# Patient Record
Sex: Female | Born: 1935 | Race: White | Hispanic: No | Smoking: Current every day smoker
Health system: Southern US, Community
[De-identification: ages and names within clinical notes are randomized; demographics above are authoritative.]

## PROBLEM LIST (undated history)

## (undated) DIAGNOSIS — F32A Depression, unspecified: Secondary | ICD-10-CM

## (undated) DIAGNOSIS — E785 Hyperlipidemia, unspecified: Secondary | ICD-10-CM

## (undated) DIAGNOSIS — I714 Abdominal aortic aneurysm, without rupture, unspecified: Secondary | ICD-10-CM

## (undated) DIAGNOSIS — T8859XA Other complications of anesthesia, initial encounter: Secondary | ICD-10-CM

## (undated) DIAGNOSIS — I272 Pulmonary hypertension, unspecified: Secondary | ICD-10-CM

## (undated) DIAGNOSIS — J189 Pneumonia, unspecified organism: Secondary | ICD-10-CM

## (undated) DIAGNOSIS — T4145XA Adverse effect of unspecified anesthetic, initial encounter: Secondary | ICD-10-CM

## (undated) DIAGNOSIS — I700 Atherosclerosis of aorta: Secondary | ICD-10-CM

## (undated) DIAGNOSIS — M1990 Unspecified osteoarthritis, unspecified site: Secondary | ICD-10-CM

## (undated) DIAGNOSIS — I10 Essential (primary) hypertension: Secondary | ICD-10-CM

## (undated) DIAGNOSIS — F1910 Other psychoactive substance abuse, uncomplicated: Secondary | ICD-10-CM

## (undated) DIAGNOSIS — Z8719 Personal history of other diseases of the digestive system: Secondary | ICD-10-CM

## (undated) DIAGNOSIS — K429 Umbilical hernia without obstruction or gangrene: Secondary | ICD-10-CM

## (undated) DIAGNOSIS — I5020 Unspecified systolic (congestive) heart failure: Secondary | ICD-10-CM

## (undated) DIAGNOSIS — I4891 Unspecified atrial fibrillation: Secondary | ICD-10-CM

## (undated) DIAGNOSIS — J40 Bronchitis, not specified as acute or chronic: Secondary | ICD-10-CM

## (undated) DIAGNOSIS — F329 Major depressive disorder, single episode, unspecified: Secondary | ICD-10-CM

## (undated) DIAGNOSIS — J449 Chronic obstructive pulmonary disease, unspecified: Secondary | ICD-10-CM

## (undated) DIAGNOSIS — F419 Anxiety disorder, unspecified: Secondary | ICD-10-CM

## (undated) DIAGNOSIS — E7800 Pure hypercholesterolemia, unspecified: Secondary | ICD-10-CM

## (undated) HISTORY — DX: Major depressive disorder, single episode, unspecified: F32.9

## (undated) HISTORY — DX: Abdominal aortic aneurysm, without rupture: I71.4

## (undated) HISTORY — DX: Atherosclerosis of aorta: I70.0

## (undated) HISTORY — DX: Hyperlipidemia, unspecified: E78.5

## (undated) HISTORY — PX: TONSILLECTOMY: SUR1361

## (undated) HISTORY — DX: Bronchitis, not specified as acute or chronic: J40

## (undated) HISTORY — PX: ABDOMINAL HYSTERECTOMY: SHX81

## (undated) HISTORY — DX: Pulmonary hypertension, unspecified: I27.20

## (undated) HISTORY — DX: Depression, unspecified: F32.A

## (undated) HISTORY — DX: Anxiety disorder, unspecified: F41.9

## (undated) HISTORY — DX: Umbilical hernia without obstruction or gangrene: K42.9

## (undated) HISTORY — DX: Abdominal aortic aneurysm, without rupture, unspecified: I71.40

## (undated) HISTORY — DX: Other psychoactive substance abuse, uncomplicated: F19.10

## (undated) HISTORY — DX: Unspecified atrial fibrillation: I48.91

## (undated) HISTORY — DX: Personal history of other diseases of the digestive system: Z87.19

## (undated) HISTORY — DX: Essential (primary) hypertension: I10

## (undated) HISTORY — DX: Chronic obstructive pulmonary disease, unspecified: J44.9

## (undated) HISTORY — DX: Pure hypercholesterolemia, unspecified: E78.00

---

## 2004-06-28 ENCOUNTER — Ambulatory Visit (HOSPITAL_COMMUNITY): Admission: RE | Admit: 2004-06-28 | Discharge: 2004-06-28 | Payer: Self-pay | Admitting: Internal Medicine

## 2004-07-07 ENCOUNTER — Ambulatory Visit (HOSPITAL_COMMUNITY): Admission: RE | Admit: 2004-07-07 | Discharge: 2004-07-07 | Payer: Self-pay | Admitting: Internal Medicine

## 2004-07-10 LAB — HM DEXA SCAN

## 2004-09-16 ENCOUNTER — Ambulatory Visit (HOSPITAL_COMMUNITY): Admission: RE | Admit: 2004-09-16 | Discharge: 2004-09-16 | Payer: Self-pay | Admitting: Internal Medicine

## 2005-04-14 ENCOUNTER — Ambulatory Visit (HOSPITAL_COMMUNITY): Admission: RE | Admit: 2005-04-14 | Discharge: 2005-04-14 | Payer: Self-pay | Admitting: Internal Medicine

## 2005-10-14 ENCOUNTER — Emergency Department (HOSPITAL_COMMUNITY): Admission: EM | Admit: 2005-10-14 | Discharge: 2005-10-14 | Payer: Self-pay | Admitting: Emergency Medicine

## 2006-09-14 ENCOUNTER — Ambulatory Visit (HOSPITAL_COMMUNITY): Admission: RE | Admit: 2006-09-14 | Discharge: 2006-09-14 | Payer: Self-pay | Admitting: Internal Medicine

## 2007-02-13 ENCOUNTER — Ambulatory Visit (HOSPITAL_COMMUNITY): Admission: RE | Admit: 2007-02-13 | Discharge: 2007-02-13 | Payer: Self-pay | Admitting: Internal Medicine

## 2007-11-25 ENCOUNTER — Ambulatory Visit: Payer: Self-pay | Admitting: Surgery

## 2007-11-25 ENCOUNTER — Encounter (INDEPENDENT_AMBULATORY_CARE_PROVIDER_SITE_OTHER): Payer: Self-pay | Admitting: Emergency Medicine

## 2007-11-25 ENCOUNTER — Emergency Department (HOSPITAL_COMMUNITY): Admission: EM | Admit: 2007-11-25 | Discharge: 2007-11-25 | Payer: Self-pay | Admitting: Emergency Medicine

## 2009-04-08 ENCOUNTER — Emergency Department (HOSPITAL_COMMUNITY): Admission: EM | Admit: 2009-04-08 | Discharge: 2009-04-09 | Payer: Self-pay | Admitting: Emergency Medicine

## 2010-05-10 HISTORY — PX: SMALL INTESTINE SURGERY: SHX150

## 2010-05-10 HISTORY — PX: HERNIA REPAIR: SHX51

## 2010-05-15 ENCOUNTER — Inpatient Hospital Stay (HOSPITAL_COMMUNITY)
Admission: EM | Admit: 2010-05-15 | Discharge: 2010-05-20 | Payer: Self-pay | Source: Home / Self Care | Admitting: Emergency Medicine

## 2010-05-16 ENCOUNTER — Encounter (INDEPENDENT_AMBULATORY_CARE_PROVIDER_SITE_OTHER): Payer: Self-pay | Admitting: Internal Medicine

## 2010-05-17 ENCOUNTER — Encounter (INDEPENDENT_AMBULATORY_CARE_PROVIDER_SITE_OTHER): Payer: Self-pay | Admitting: Internal Medicine

## 2010-09-20 LAB — URINE CULTURE
Colony Count: 40000
Culture  Setup Time: 201111062354

## 2010-09-20 LAB — COMPREHENSIVE METABOLIC PANEL
ALT: 19 U/L (ref 0–35)
ALT: 20 U/L (ref 0–35)
ALT: 23 U/L (ref 0–35)
AST: 27 U/L (ref 0–37)
AST: 29 U/L (ref 0–37)
AST: 30 U/L (ref 0–37)
Albumin: 3.5 g/dL (ref 3.5–5.2)
Albumin: 3.9 g/dL (ref 3.5–5.2)
Alkaline Phosphatase: 38 U/L — ABNORMAL LOW (ref 39–117)
Alkaline Phosphatase: 47 U/L (ref 39–117)
Alkaline Phosphatase: 53 U/L (ref 39–117)
BUN: 14 mg/dL (ref 6–23)
BUN: 20 mg/dL (ref 6–23)
CO2: 25 mEq/L (ref 19–32)
CO2: 27 mEq/L (ref 19–32)
CO2: 27 mEq/L (ref 19–32)
Calcium: 8.4 mg/dL (ref 8.4–10.5)
Calcium: 8.8 mg/dL (ref 8.4–10.5)
Calcium: 9.3 mg/dL (ref 8.4–10.5)
Chloride: 103 mEq/L (ref 96–112)
Chloride: 107 mEq/L (ref 96–112)
Creatinine, Ser: 1.08 mg/dL (ref 0.4–1.2)
Creatinine, Ser: 1.18 mg/dL (ref 0.4–1.2)
GFR calc Af Amer: 54 mL/min — ABNORMAL LOW (ref >60)
GFR calc Af Amer: 59 mL/min — ABNORMAL LOW (ref >60)
GFR calc Af Amer: >60 mL/min (ref >60)
GFR calc non Af Amer: 44 mL/min — ABNORMAL LOW (ref >60)
GFR calc non Af Amer: 49 mL/min — ABNORMAL LOW (ref >60)
GFR calc non Af Amer: >60 mL/min (ref >60)
Glucose, Bld: 114 mg/dL — ABNORMAL HIGH (ref 70–99)
Glucose, Bld: 130 mg/dL — ABNORMAL HIGH (ref 70–99)
Glucose, Bld: 142 mg/dL — ABNORMAL HIGH (ref 70–99)
Potassium: 3.9 mEq/L (ref 3.5–5.1)
Potassium: 4.1 mEq/L (ref 3.5–5.1)
Potassium: 4.6 mEq/L (ref 3.5–5.1)
Sodium: 138 mEq/L (ref 135–145)
Sodium: 140 mEq/L (ref 135–145)
Sodium: 141 mEq/L (ref 135–145)
Total Bilirubin: 0.7 mg/dL (ref 0.3–1.2)
Total Bilirubin: 0.7 mg/dL (ref 0.3–1.2)
Total Protein: 5.9 g/dL — ABNORMAL LOW (ref 6.0–8.3)
Total Protein: 6.2 g/dL (ref 6.0–8.3)
Total Protein: 6.9 g/dL (ref 6.0–8.3)

## 2010-09-20 LAB — URINALYSIS, ROUTINE W REFLEX MICROSCOPIC
Bilirubin Urine: NEGATIVE
Glucose, UA: NEGATIVE mg/dL
Hgb urine dipstick: NEGATIVE
Ketones, ur: NEGATIVE mg/dL
Nitrite: NEGATIVE
Protein, ur: NEGATIVE mg/dL
Specific Gravity, Urine: 1.022 (ref 1.005–1.030)
Urobilinogen, UA: 0.2 mg/dL (ref 0.0–1.0)
pH: 7 (ref 5.0–8.0)

## 2010-09-20 LAB — DIFFERENTIAL
Basophils Absolute: 0 10*3/uL (ref 0.0–0.1)
Basophils Absolute: 0.1 10*3/uL (ref 0.0–0.1)
Basophils Relative: 0 % (ref 0–1)
Basophils Relative: 1 % (ref 0–1)
Eosinophils Absolute: 0 10*3/uL (ref 0.0–0.7)
Eosinophils Absolute: 0 10*3/uL (ref 0.0–0.7)
Eosinophils Relative: 0 % (ref 0–5)
Eosinophils Relative: 0 % (ref 0–5)
Lymphocytes Relative: 13 % (ref 12–46)
Lymphocytes Relative: 15 % (ref 12–46)
Lymphs Abs: 1.5 10*3/uL (ref 0.7–4.0)
Lymphs Abs: 1.6 10*3/uL (ref 0.7–4.0)
Monocytes Absolute: 0.5 10*3/uL (ref 0.1–1.0)
Monocytes Absolute: 0.5 10*3/uL (ref 0.1–1.0)
Monocytes Relative: 4 % (ref 3–12)
Monocytes Relative: 5 % (ref 3–12)
Neutro Abs: 8.3 10*3/uL — ABNORMAL HIGH (ref 1.7–7.7)
Neutro Abs: 9.2 10*3/uL — ABNORMAL HIGH (ref 1.7–7.7)
Neutrophils Relative %: 80 % — ABNORMAL HIGH (ref 43–77)
Neutrophils Relative %: 82 % — ABNORMAL HIGH (ref 43–77)

## 2010-09-20 LAB — CBC
HCT: 37 % (ref 36.0–46.0)
HCT: 39.9 % (ref 36.0–46.0)
HCT: 41 % (ref 36.0–46.0)
HCT: 44.3 % (ref 36.0–46.0)
Hemoglobin: 12.8 g/dL (ref 12.0–15.0)
Hemoglobin: 14.1 g/dL (ref 12.0–15.0)
Hemoglobin: 15.2 g/dL — ABNORMAL HIGH (ref 12.0–15.0)
MCH: 33.8 pg (ref 26.0–34.0)
MCH: 34.1 pg — ABNORMAL HIGH (ref 26.0–34.0)
MCHC: 34.3 g/dL (ref 30.0–36.0)
MCHC: 34.4 g/dL (ref 30.0–36.0)
MCHC: 34.4 g/dL (ref 30.0–36.0)
MCHC: 34.5 g/dL (ref 30.0–36.0)
MCV: 98.6 fL (ref 78.0–100.0)
MCV: 99.1 fL (ref 78.0–100.0)
MCV: 99.2 fL (ref 78.0–100.0)
Platelets: 167 10*3/uL (ref 150–400)
Platelets: 183 10*3/uL (ref 150–400)
Platelets: 194 10*3/uL (ref 150–400)
RBC: 4.16 MIL/uL (ref 3.87–5.11)
RBC: 4.47 MIL/uL (ref 3.87–5.11)
RDW: 13.7 % (ref 11.5–15.5)
RDW: 13.7 % (ref 11.5–15.5)
RDW: 13.9 % (ref 11.5–15.5)
WBC: 10.3 10*3/uL (ref 4.0–10.5)
WBC: 10.9 10*3/uL — ABNORMAL HIGH (ref 4.0–10.5)
WBC: 11.3 10*3/uL — ABNORMAL HIGH (ref 4.0–10.5)

## 2010-09-20 LAB — BLOOD GAS, ARTERIAL
Acid-base deficit: 4.3 mmol/L — ABNORMAL HIGH (ref 0.0–2.0)
Bicarbonate: 22.3 mEq/L (ref 20.0–24.0)
Bicarbonate: 27.7 mEq/L — ABNORMAL HIGH (ref 20.0–24.0)
Inspiratory PAP: 14
O2 Saturation: 94 %
Patient temperature: 98.6
TCO2: 20.4 mmol/L (ref 0–100)
TCO2: 24.7 mmol/L (ref 0–100)
pCO2 arterial: 43.9 mmHg (ref 35.0–45.0)
pCO2 arterial: 48.9 mmHg — ABNORMAL HIGH (ref 35.0–45.0)
pH, Arterial: 7.416 — ABNORMAL HIGH (ref 7.350–7.400)
pO2, Arterial: 53.1 mmHg — ABNORMAL LOW (ref 80.0–100.0)
pO2, Arterial: 75.8 mmHg — ABNORMAL LOW (ref 80.0–100.0)

## 2010-09-20 LAB — HEMOGLOBIN A1C
Hgb A1c MFr Bld: 6.2 % — ABNORMAL HIGH (ref <5.7)
Mean Plasma Glucose: 131 mg/dL — ABNORMAL HIGH (ref <117)

## 2010-09-20 LAB — BASIC METABOLIC PANEL
BUN: 10 mg/dL (ref 6–23)
Chloride: 101 mEq/L (ref 96–112)
Creatinine, Ser: 0.9 mg/dL (ref 0.4–1.2)
Glucose, Bld: 129 mg/dL — ABNORMAL HIGH (ref 70–99)
Potassium: 4.2 mEq/L (ref 3.5–5.1)

## 2010-09-20 LAB — MAGNESIUM: Magnesium: 1.9 mg/dL (ref 1.5–2.5)

## 2010-09-20 LAB — PROTIME-INR
INR: 1.07 (ref 0.00–1.49)
Prothrombin Time: 14.1 seconds (ref 11.6–15.2)

## 2010-09-20 LAB — TSH: TSH: 1.867 u[IU]/mL (ref 0.350–4.500)

## 2010-09-20 LAB — LIPASE, BLOOD: Lipase: 27 U/L (ref 11–59)

## 2010-09-20 LAB — PHOSPHORUS: Phosphorus: 3.5 mg/dL (ref 2.3–4.6)

## 2010-10-13 LAB — BASIC METABOLIC PANEL
BUN: 27 mg/dL — ABNORMAL HIGH (ref 6–23)
Calcium: 9.1 mg/dL (ref 8.4–10.5)
Creatinine, Ser: 1.05 mg/dL (ref 0.4–1.2)
GFR calc non Af Amer: 51 mL/min — ABNORMAL LOW (ref >60)
Glucose, Bld: 105 mg/dL — ABNORMAL HIGH (ref 70–99)

## 2010-10-13 LAB — DIFFERENTIAL
Basophils Absolute: 0 10*3/uL (ref 0.0–0.1)
Eosinophils Relative: 1 % (ref 0–5)
Lymphocytes Relative: 23 % (ref 12–46)
Neutro Abs: 9.1 10*3/uL — ABNORMAL HIGH (ref 1.7–7.7)
Neutrophils Relative %: 70 % (ref 43–77)

## 2010-10-13 LAB — BLOOD GAS, ARTERIAL
Acid-Base Excess: 0.6 mmol/L (ref 0.0–2.0)
Drawn by: 309681
FIO2: 0.21 %
pCO2 arterial: 35.8 mmHg (ref 35.0–45.0)

## 2010-10-13 LAB — CBC
Platelets: 185 10*3/uL (ref 150–400)
RDW: 13.8 % (ref 11.5–15.5)

## 2011-01-03 ENCOUNTER — Other Ambulatory Visit: Payer: Self-pay | Admitting: Internal Medicine

## 2011-01-06 ENCOUNTER — Ambulatory Visit
Admission: RE | Admit: 2011-01-06 | Discharge: 2011-01-06 | Disposition: A | Discharge status: Home / Self Care | Payer: Medicare Other | Source: Ambulatory Visit | Attending: Internal Medicine | Admitting: Internal Medicine

## 2011-01-24 ENCOUNTER — Encounter: Payer: Self-pay | Admitting: Vascular Surgery

## 2011-01-26 ENCOUNTER — Encounter: Payer: Self-pay | Admitting: Vascular Surgery

## 2011-01-31 ENCOUNTER — Encounter: Payer: Self-pay | Admitting: Vascular Surgery

## 2011-01-31 ENCOUNTER — Ambulatory Visit (INDEPENDENT_AMBULATORY_CARE_PROVIDER_SITE_OTHER): Payer: Medicare Other | Admitting: Vascular Surgery

## 2011-01-31 VITALS — BP 108/68 | HR 71 | Resp 22 | Ht 65.0 in | Wt 230.0 lb

## 2011-01-31 DIAGNOSIS — I714 Abdominal aortic aneurysm, without rupture: Secondary | ICD-10-CM

## 2011-01-31 NOTE — Progress Notes (Signed)
4.8 cm aaa

## 2011-01-31 NOTE — Progress Notes (Signed)
Subjective:     Patient ID: Heather Stevenson, female   DOB: 09-19-1929, 77 y.o.   MRN: [id number]  HPI this 77 year old female is referred by Dr. Glenda Chroman for evaluation of an infrarenal abdominal aortic aneurysm. This was found by CT scan in November of 2011 at which time she had small bowel obstruction which required surgery. The aneurysm measured 4.2 cm in maximum diameter at that time. An ultrasound exam was performed June 29 and 2012 which revealed maximum diameter to be 4.8 cm. I have reviewed the CT scan performed in November of 2011. This reveals the aneurysm to be relatively fusiform in nature. It does appear to extend proximal to both renal arteries with laminated thrombus. She recovered well from her exploratory laparotomy for small bowel obstruction and did require resection of this short segment of small intestine. Her only previous abdominal operation with hysterectomy.  Review of Systems  Constitutional: Negative for fever, chills, activity change, appetite change, fatigue and unexpected weight change.  HENT: Negative for hearing loss, congestion, sore throat, trouble swallowing, neck pain and voice change.   Eyes: Negative for visual disturbance.  Respiratory: Positive for shortness of breath. Negative for cough, chest tightness, wheezing and stridor.   Cardiovascular: Negative for chest pain, palpitations and leg swelling.  Gastrointestinal: Negative for nausea, abdominal pain, diarrhea, constipation, blood in stool and abdominal distention.  Genitourinary: Negative for dysuria, urgency, frequency, hematuria and difficulty urinating.  Musculoskeletal: Positive for gait problem. Negative for back pain and joint swelling.  Skin: Negative for color change, pallor, rash and wound.  Neurological: Negative for dizziness, seizures, syncope, facial asymmetry, speech difficulty, weakness, light-headedness, numbness and headaches.  Hematological: Negative for adenopathy. Does not  bruise/bleed easily.  Psychiatric/Behavioral: Negative for confusion.       Objective:   Physical Exam  Constitutional: She is oriented to person, place, and time. She appears well-developed and well-nourished.  HENT:  Head: Normocephalic.  Nose: Nose normal.  Mouth/Throat: Oropharynx is clear and moist.  Eyes: EOM are normal. Pupils are equal, round, and reactive to light. No scleral icterus.  Neck: Normal range of motion. Neck supple. No JVD present.  Cardiovascular: Normal rate, regular rhythm, normal heart sounds and intact distal pulses.   No murmur heard.      Lower extremity pulses are 2+ at the dorsalis pedis and posterior tibial level bilaterally.  Pulmonary/Chest: Effort normal and breath sounds normal. No stridor. No respiratory distress. She has no wheezes. She has no rales.       Bilateral rhonchi are noted.  Abdominal: Soft. She exhibits no distension and no mass. There is no tenderness. There is no rebound and no guarding.       Abdomen is obese with no pulsatile mass noted.  Musculoskeletal: Normal range of motion. She exhibits no edema and no tenderness.  Lymphadenopathy:    She has no cervical adenopathy.  Neurological: She is alert and oriented to person, place, and time. Coordination normal.  Skin: Skin is warm and dry. No rash noted. No erythema.  Psychiatric: She has a normal mood and affect. Her behavior is normal.       Assessment:    infrarenal abdominal aortic aneurysm measuring 4.8 cm in maximum diameter by ultrasound which appears to extend proximal to the renal arteries.    Plan:    #1-patient will return in 12 months with a CT angiogram to further assess the aortic aneurysm. #2 patient will attempt to discontinue smoking

## 2011-10-25 ENCOUNTER — Other Ambulatory Visit: Payer: Self-pay | Admitting: *Deleted

## 2011-10-25 DIAGNOSIS — I714 Abdominal aortic aneurysm, without rupture: Secondary | ICD-10-CM

## 2011-10-25 DIAGNOSIS — Z48812 Encounter for surgical aftercare following surgery on the circulatory system: Secondary | ICD-10-CM

## 2012-01-05 ENCOUNTER — Encounter (INDEPENDENT_AMBULATORY_CARE_PROVIDER_SITE_OTHER): Payer: Medicare Other | Admitting: Surgery

## 2012-01-30 ENCOUNTER — Other Ambulatory Visit: Payer: Medicare Other

## 2012-01-30 ENCOUNTER — Ambulatory Visit: Payer: Medicare Other | Admitting: Vascular Surgery

## 2012-01-31 ENCOUNTER — Encounter (INDEPENDENT_AMBULATORY_CARE_PROVIDER_SITE_OTHER): Payer: Self-pay | Admitting: Surgery

## 2012-02-01 ENCOUNTER — Encounter (INDEPENDENT_AMBULATORY_CARE_PROVIDER_SITE_OTHER): Payer: Self-pay | Admitting: Surgery

## 2012-02-01 ENCOUNTER — Ambulatory Visit (INDEPENDENT_AMBULATORY_CARE_PROVIDER_SITE_OTHER): Payer: Medicare Other | Admitting: Surgery

## 2012-02-01 VITALS — BP 140/72 | HR 77 | Temp 97.6°F | Ht 66.0 in | Wt 223.4 lb

## 2012-02-01 DIAGNOSIS — K432 Incisional hernia without obstruction or gangrene: Secondary | ICD-10-CM | POA: Insufficient documentation

## 2012-02-01 NOTE — Progress Notes (Signed)
Subjective:     Patient ID: Heather Stevenson, female   DOB: March 22, 1930, 78 y.o.   MRN: [id number]  HPI This is a patient of mine who had an emergent exploratory laparotomy in November of 2011 for incarcerated hernia. She had had a small bowel resection at that time. She now has a recurrent hernia. She is referred by Dr. Melford Aase.  She has mild pain. She has no further symptoms. She has noticed a bulge at her old incision. The pain is described as sharp Review of Systems     Objective:   Physical Exam On exam, her abdomen is obese. There is a reducible hernia at her incision just above the umbilicus. There is minimal tenderness    Assessment:     Reducible incisional hernia    Plan:     Laparoscopic repair with mesh was recommended. I discussed this with the family in detail including the risks. She will try to quit smoking prior to surgery. Surgery will thus be scheduled

## 2012-02-09 ENCOUNTER — Encounter (HOSPITAL_COMMUNITY): Payer: Self-pay | Admitting: Pharmacy Technician

## 2012-02-14 ENCOUNTER — Encounter (HOSPITAL_COMMUNITY)
Admission: RE | Admit: 2012-02-14 | Discharge: 2012-02-14 | Disposition: A | Discharge status: Home / Self Care | Payer: Medicare Other | Source: Ambulatory Visit | Attending: Surgery | Admitting: Surgery

## 2012-02-14 ENCOUNTER — Ambulatory Visit (HOSPITAL_COMMUNITY)
Admission: RE | Admit: 2012-02-14 | Discharge: 2012-02-14 | Disposition: A | Discharge status: Home / Self Care | Payer: Medicare Other | Source: Ambulatory Visit | Attending: Surgery | Admitting: Surgery

## 2012-02-14 ENCOUNTER — Encounter (HOSPITAL_COMMUNITY): Payer: Self-pay

## 2012-02-14 DIAGNOSIS — K429 Umbilical hernia without obstruction or gangrene: Secondary | ICD-10-CM | POA: Insufficient documentation

## 2012-02-14 DIAGNOSIS — I4510 Unspecified right bundle-branch block: Secondary | ICD-10-CM | POA: Insufficient documentation

## 2012-02-14 DIAGNOSIS — Z01818 Encounter for other preprocedural examination: Secondary | ICD-10-CM | POA: Insufficient documentation

## 2012-02-14 DIAGNOSIS — Z01810 Encounter for preprocedural cardiovascular examination: Secondary | ICD-10-CM | POA: Insufficient documentation

## 2012-02-14 DIAGNOSIS — Z01812 Encounter for preprocedural laboratory examination: Secondary | ICD-10-CM | POA: Insufficient documentation

## 2012-02-14 DIAGNOSIS — I440 Atrioventricular block, first degree: Secondary | ICD-10-CM | POA: Insufficient documentation

## 2012-02-14 HISTORY — DX: Unspecified osteoarthritis, unspecified site: M19.90

## 2012-02-14 HISTORY — DX: Other complications of anesthesia, initial encounter: T88.59XA

## 2012-02-14 HISTORY — DX: Pneumonia, unspecified organism: J18.9

## 2012-02-14 HISTORY — DX: Adverse effect of unspecified anesthetic, initial encounter: T41.45XA

## 2012-02-14 LAB — SURGICAL PCR SCREEN: Staphylococcus aureus: NEGATIVE

## 2012-02-14 LAB — CBC
HCT: 42.7 % (ref 36.0–46.0)
Hemoglobin: 14.7 g/dL (ref 12.0–15.0)
RDW: 13.3 % (ref 11.5–15.5)
WBC: 7.2 10*3/uL (ref 4.0–10.5)

## 2012-02-14 LAB — BASIC METABOLIC PANEL
BUN: 21 mg/dL (ref 6–23)
Chloride: 102 mEq/L (ref 96–112)
GFR calc Af Amer: 54 mL/min — ABNORMAL LOW (ref >90)
Potassium: 4.5 mEq/L (ref 3.5–5.1)
Sodium: 141 mEq/L (ref 135–145)

## 2012-02-14 NOTE — Patient Instructions (Signed)
30 Heather Stevenson  02/14/2012   Your procedure is scheduled on:  02/21/12   Wednesday  Surgery [id number]  Report to [place] at    0730   AM.  Call this number if you have problems the morning of surgery: [phone]     Or PST   [id number]  [facility]   Remember:   Do not eat food or fluids :After Midnight.  Tuesday NIGHT  :     Take these medicines the morning of surgery with A SIP OF WATER:  METOPROLOL   Do not wear jewelry, make-up or nail polish.  Do not wear lotions, powders, or perfumes. You may wear deodorant.  Do not shave 48 hours prior to surgery.  Do not bring valuables to the hospital.  Contacts, dentures or bridgework may not be worn into surgery.  Leave suitcase in the car. After surgery it may be brought to your room.  For patients admitted to the hospital, checkout time is 11:00 AM the day of discharge.   Patients discharged the day of surgery will not be allowed to drive home.  Name and phone number of your driver:     daughter                                                                 Special Instructions: CHG Shower Use Special Wash: 1/2 bottle night before surgery and 1/2 bottle morning of surgery. REGULAR SOAP FACE AND PRIVATES              LADIES- NO SHAVING 42 HOURS BEFORE USING BETASEPT SOAP.                  Please read over the following fact sheets that you were given: MRSA Information

## 2012-02-14 NOTE — Pre-Procedure Instructions (Signed)
Instructed to stop aspirin, antiinflammatories and herbal meds 5 days pre op as per order

## 2012-02-20 NOTE — H&P (Signed)
Heather Stevenson is an 78 y.o. female.   Chief Complaint: incisional hernia HPI: she presents with a painful hernia at her incision from previous exploratory laparotomy.  Elective repair is planned. She is otherwise without complaint  Past Medical History  Diagnosis Date  . Hyperlipidemia   . Gout   . History of small bowel obstruction   . COPD (chronic obstructive pulmonary disease)   . Substance abuse     Tobacco  . Depression   . Pulmonary hypertension   . Umbilical hernia   . Bronchitis   . Atrial fibrillation   . High cholesterol   . Pneumonia   . Arthritis   . Abdominal aortic aneurysm     followed by Dr Kellie Simmering- 4.8 cm per [place] 6/12- [facility]  . Complication of anesthesia     slow to wake up per pt- anesthesia record on chart    Past Surgical History  Procedure Date  . Small intestine surgery 05/2010  . Abdominal hysterectomy     Vaginal  . Hernia repair 05/2010    incarcerated Umbilical hernia  . Tonsillectomy     No family history on file. Social History:  reports that she has been smoking Cigarettes.  She has a 25 pack-year smoking history. She has never used smokeless tobacco. She reports that she does not drink alcohol or use illicit drugs.  Allergies: No Known Allergies  No prescriptions prior to admission    No results found for this or any previous visit (from the past 48 hour(s)). No results found.  Review of Systems  All other systems reviewed and are negative.    There were no vitals taken for this visit. Wt Readings from Last 3 Encounters:  02/14/12 226 lb (102.513 kg)  02/01/12 223 lb 6.4 oz (101.334 kg)  01/31/11 230 lb (104.327 kg)   Temp Readings from Last 3 Encounters:  02/14/12 98.4 F (36.9 C) Oral  02/01/12 97.6 F (36.4 C) Temporal   BP Readings from Last 3 Encounters:  02/14/12 111/64  02/01/12 140/72  01/31/11 108/68   Pulse Readings from Last 3 Encounters:  02/14/12 60  02/01/12 77  01/31/11 71    Physical Exam    Constitutional: She is oriented to person, place, and time. She appears well-developed and well-nourished. No distress.  HENT:  Head: Normocephalic and atraumatic.  Right Ear: External ear normal.  Left Ear: External ear normal.  Nose: Nose normal.  Mouth/Throat: Oropharynx is clear and moist. No oropharyngeal exudate.  Eyes: Conjunctivae are normal. Pupils are equal, round, and reactive to light. No scleral icterus.  Neck: Normal range of motion. Neck supple. No JVD present. No tracheal deviation present.  Cardiovascular: Normal rate, regular rhythm, normal heart sounds and intact distal pulses.   No murmur heard. Respiratory: Effort normal and breath sounds normal. No respiratory distress. She has no wheezes.  GI: Soft. Bowel sounds are normal. She exhibits no distension. There is no tenderness.       +incisional hernia upper abdomen  Neurological: She is alert and oriented to person, place, and time.  Skin: Skin is warm and dry.  Psychiatric: Her behavior is normal. Judgment normal.     Assessment/Plan Symptomatic incisional hernia  Laparoscopic repair with mesh is recommended.  The risks have been discussed in detail.  These include but are not limited to bleeding, infection, injury to the bowel, need to convert to an open procedure, recurrence, cardiopulmonary issues given her age and comorbidities, etc.  She agrees to  proceed.  Likelihood of success is good.  Deyton Ellenbecker A 02/20/2012, 7:48 PM

## 2012-02-21 ENCOUNTER — Observation Stay (HOSPITAL_COMMUNITY)
Admission: RE | Admit: 2012-02-21 | Discharge: 2012-02-23 | Disposition: A | Discharge status: Home / Self Care | Payer: Medicare Other | Source: Ambulatory Visit | Attending: Surgery | Admitting: Surgery

## 2012-02-21 ENCOUNTER — Ambulatory Visit (HOSPITAL_COMMUNITY): Payer: Medicare Other | Admitting: *Deleted

## 2012-02-21 ENCOUNTER — Encounter (HOSPITAL_COMMUNITY): Payer: Self-pay | Admitting: *Deleted

## 2012-02-21 ENCOUNTER — Encounter (HOSPITAL_COMMUNITY)
Admission: RE | Disposition: A | Discharge status: Home / Self Care | Payer: Self-pay | Source: Ambulatory Visit | Attending: Surgery

## 2012-02-21 DIAGNOSIS — J449 Chronic obstructive pulmonary disease, unspecified: Secondary | ICD-10-CM | POA: Insufficient documentation

## 2012-02-21 DIAGNOSIS — Z79899 Other long term (current) drug therapy: Secondary | ICD-10-CM | POA: Insufficient documentation

## 2012-02-21 DIAGNOSIS — E7800 Pure hypercholesterolemia, unspecified: Secondary | ICD-10-CM | POA: Insufficient documentation

## 2012-02-21 DIAGNOSIS — I714 Abdominal aortic aneurysm, without rupture, unspecified: Secondary | ICD-10-CM | POA: Insufficient documentation

## 2012-02-21 DIAGNOSIS — K432 Incisional hernia without obstruction or gangrene: Secondary | ICD-10-CM

## 2012-02-21 DIAGNOSIS — Z90710 Acquired absence of both cervix and uterus: Secondary | ICD-10-CM | POA: Insufficient documentation

## 2012-02-21 DIAGNOSIS — E785 Hyperlipidemia, unspecified: Secondary | ICD-10-CM | POA: Insufficient documentation

## 2012-02-21 DIAGNOSIS — J4489 Other specified chronic obstructive pulmonary disease: Secondary | ICD-10-CM | POA: Insufficient documentation

## 2012-02-21 HISTORY — PX: INCISIONAL HERNIA REPAIR: SHX193

## 2012-02-21 SURGERY — REPAIR, HERNIA, INCISIONAL, LAPAROSCOPIC
Anesthesia: General | Site: Abdomen | Wound class: Clean

## 2012-02-21 MED ORDER — PROMETHAZINE HCL 25 MG/ML IJ SOLN: 6.2500 mg | INTRAMUSCULAR | Status: DC | PRN

## 2012-02-21 MED ORDER — MEPERIDINE HCL 50 MG/ML IJ SOLN: 6.2500 mg | INTRAMUSCULAR | Status: DC | PRN

## 2012-02-21 MED ORDER — LACTATED RINGERS IV SOLN: INTRAVENOUS | Status: DC

## 2012-02-21 MED ORDER — PHENYLEPHRINE HCL 10 MG/ML IJ SOLN
INTRAMUSCULAR | Status: DC | PRN
  Administered 2012-02-21 (×3): 40 ug via INTRAVENOUS

## 2012-02-21 MED ORDER — IBUPROFEN 600 MG PO TABS
600.0000 mg | ORAL_TABLET | Freq: Four times a day (QID) | ORAL | Status: DC | PRN
  Filled 2012-02-21: qty 1

## 2012-02-21 MED ORDER — CITALOPRAM HYDROBROMIDE 20 MG PO TABS
20.0000 mg | ORAL_TABLET | Freq: Every day | ORAL | Status: DC
  Administered 2012-02-21 – 2012-02-22 (×2): 20 mg via ORAL
  Filled 2012-02-21 (×3): qty 1

## 2012-02-21 MED ORDER — HYDROMORPHONE HCL PF 1 MG/ML IJ SOLN
INTRAMUSCULAR | Status: AC
  Filled 2012-02-21: qty 1

## 2012-02-21 MED ORDER — LACTATED RINGERS IV SOLN
INTRAVENOUS | Status: DC | PRN
  Administered 2012-02-21: 1000 mL

## 2012-02-21 MED ORDER — POTASSIUM CHLORIDE IN NACL 20-0.9 MEQ/L-% IV SOLN
INTRAVENOUS | Status: DC
  Administered 2012-02-21 – 2012-02-22 (×2): via INTRAVENOUS
  Filled 2012-02-21 (×3): qty 1000

## 2012-02-21 MED ORDER — ACETAMINOPHEN 10 MG/ML IV SOLN
INTRAVENOUS | Status: DC | PRN
  Administered 2012-02-21: 1000 mg via INTRAVENOUS

## 2012-02-21 MED ORDER — PROPOFOL 10 MG/ML IV BOLUS
INTRAVENOUS | Status: DC | PRN
  Administered 2012-02-21: 150 mg via INTRAVENOUS

## 2012-02-21 MED ORDER — ROCURONIUM BROMIDE 100 MG/10ML IV SOLN
INTRAVENOUS | Status: DC | PRN
  Administered 2012-02-21: 40 mg via INTRAVENOUS

## 2012-02-21 MED ORDER — HYDROMORPHONE HCL PF 1 MG/ML IJ SOLN
0.2500 mg | INTRAMUSCULAR | Status: DC | PRN
  Administered 2012-02-21 (×3): 0.25 mg via INTRAVENOUS

## 2012-02-21 MED ORDER — MAGNESIUM OXIDE 400 (241.3 MG) MG PO TABS
200.0000 mg | ORAL_TABLET | Freq: Every day | ORAL | Status: DC
  Administered 2012-02-22 – 2012-02-23 (×2): 200 mg via ORAL
  Filled 2012-02-21 (×3): qty 0.5

## 2012-02-21 MED ORDER — LACTATED RINGERS IV SOLN
INTRAVENOUS | Status: DC
  Administered 2012-02-21: 1000 mL via INTRAVENOUS

## 2012-02-21 MED ORDER — BUPIVACAINE HCL (PF) 0.5 % IJ SOLN
INTRAMUSCULAR | Status: DC | PRN
  Administered 2012-02-21: 30 mL

## 2012-02-21 MED ORDER — GLYCOPYRROLATE 0.2 MG/ML IJ SOLN
INTRAMUSCULAR | Status: DC | PRN
  Administered 2012-02-21: .8 mg via INTRAVENOUS

## 2012-02-21 MED ORDER — ALLOPURINOL 300 MG PO TABS
300.0000 mg | ORAL_TABLET | Freq: Every day | ORAL | Status: DC
  Administered 2012-02-22 – 2012-02-23 (×2): 300 mg via ORAL
  Filled 2012-02-21 (×3): qty 1

## 2012-02-21 MED ORDER — ENOXAPARIN SODIUM 30 MG/0.3ML ~~LOC~~ SOLN
30.0000 mg | SUBCUTANEOUS | Status: DC
  Administered 2012-02-22 – 2012-02-23 (×2): 30 mg via SUBCUTANEOUS
  Filled 2012-02-21 (×3): qty 0.3

## 2012-02-21 MED ORDER — ONDANSETRON HCL 4 MG PO TABS
4.0000 mg | ORAL_TABLET | Freq: Four times a day (QID) | ORAL | Status: DC | PRN
  Administered 2012-02-22: 4 mg via ORAL
  Filled 2012-02-21: qty 1

## 2012-02-21 MED ORDER — NEOSTIGMINE METHYLSULFATE 1 MG/ML IJ SOLN
INTRAMUSCULAR | Status: DC | PRN
  Administered 2012-02-21: 4 mg via INTRAVENOUS

## 2012-02-21 MED ORDER — METOPROLOL TARTRATE 12.5 MG HALF TABLET
12.5000 mg | ORAL_TABLET | Freq: Two times a day (BID) | ORAL | Status: DC
  Administered 2012-02-21 – 2012-02-23 (×4): 12.5 mg via ORAL
  Filled 2012-02-21 (×6): qty 1

## 2012-02-21 MED ORDER — LIP MEDEX EX OINT
TOPICAL_OINTMENT | CUTANEOUS | Status: AC
  Filled 2012-02-21: qty 7

## 2012-02-21 MED ORDER — LIDOCAINE HCL (CARDIAC) 20 MG/ML IV SOLN
INTRAVENOUS | Status: DC | PRN
  Administered 2012-02-21: 50 mg via INTRAVENOUS

## 2012-02-21 MED ORDER — CEFAZOLIN SODIUM-DEXTROSE 2-3 GM-% IV SOLR
2.0000 g | INTRAVENOUS | Status: AC
  Administered 2012-02-21: 2 g via INTRAVENOUS

## 2012-02-21 MED ORDER — BUPIVACAINE HCL (PF) 0.5 % IJ SOLN
INTRAMUSCULAR | Status: AC
  Filled 2012-02-21: qty 30

## 2012-02-21 MED ORDER — CEFAZOLIN SODIUM-DEXTROSE 2-3 GM-% IV SOLR
INTRAVENOUS | Status: AC
  Filled 2012-02-21: qty 50

## 2012-02-21 MED ORDER — MELOXICAM 15 MG PO TABS
15.0000 mg | ORAL_TABLET | Freq: Every day | ORAL | Status: DC
  Administered 2012-02-22 – 2012-02-23 (×2): 15 mg via ORAL
  Filled 2012-02-21 (×3): qty 1

## 2012-02-21 MED ORDER — MAGNESIUM 250 MG PO TABS: 250.0000 mg | ORAL_TABLET | Freq: Every day | ORAL | Status: DC

## 2012-02-21 MED ORDER — ACETAMINOPHEN 10 MG/ML IV SOLN
INTRAVENOUS | Status: AC
  Filled 2012-02-21: qty 100

## 2012-02-21 MED ORDER — ONDANSETRON HCL 4 MG/2ML IJ SOLN
4.0000 mg | Freq: Four times a day (QID) | INTRAMUSCULAR | Status: DC | PRN
  Administered 2012-02-21 – 2012-02-22 (×2): 4 mg via INTRAVENOUS
  Filled 2012-02-21 (×3): qty 2

## 2012-02-21 MED ORDER — FENTANYL CITRATE 0.05 MG/ML IJ SOLN
INTRAMUSCULAR | Status: DC | PRN
  Administered 2012-02-21: 100 ug via INTRAVENOUS
  Administered 2012-02-21: 50 ug via INTRAVENOUS

## 2012-02-21 MED ORDER — HYDROCODONE-ACETAMINOPHEN 5-325 MG PO TABS: 1.0000 | ORAL_TABLET | ORAL | Status: DC | PRN

## 2012-02-21 MED ORDER — ONDANSETRON HCL 4 MG/2ML IJ SOLN
INTRAMUSCULAR | Status: DC | PRN
  Administered 2012-02-21: 4 mg via INTRAVENOUS

## 2012-02-21 MED ORDER — HYDROMORPHONE HCL PF 1 MG/ML IJ SOLN
1.0000 mg | INTRAMUSCULAR | Status: DC | PRN
  Administered 2012-02-21 – 2012-02-22 (×2): 1 mg via INTRAVENOUS
  Filled 2012-02-21: qty 1
  Filled 2012-02-21: qty 2
  Filled 2012-02-21: qty 1

## 2012-02-21 SURGICAL SUPPLY — 45 items
APL SKNCLS STERI-STRIP NONHPOA (GAUZE/BANDAGES/DRESSINGS) ×1
BANDAGE ADHESIVE 1X3 (GAUZE/BANDAGES/DRESSINGS) IMPLANT
BENZOIN TINCTURE PRP APPL 2/3 (GAUZE/BANDAGES/DRESSINGS) ×2 IMPLANT
BINDER ABD UNIV 12 45-62 (WOUND CARE) IMPLANT
BINDER ABDOMINAL 46IN 62IN (WOUND CARE) ×2
CANISTER SUCTION 2500CC (MISCELLANEOUS) ×2 IMPLANT
CANNULA ENDOPATH XCEL 11M (ENDOMECHANICALS) ×1 IMPLANT
CHLORAPREP W/TINT 26ML (MISCELLANEOUS) ×2 IMPLANT
CLOTH BEACON ORANGE TIMEOUT ST (SAFETY) ×2 IMPLANT
DECANTER SPIKE VIAL GLASS SM (MISCELLANEOUS) ×2 IMPLANT
DEVICE SECURE STRAP 25 ABSORB (INSTRUMENTS) ×2 IMPLANT
DEVICE TROCAR PUNCTURE CLOSURE (ENDOMECHANICALS) ×2 IMPLANT
DRAPE LAPAROSCOPIC ABDOMINAL (DRAPES) ×2 IMPLANT
DRSG TEGADERM 2-3/8X2-3/4 SM (GAUZE/BANDAGES/DRESSINGS) IMPLANT
ELECT REM PT RETURN 9FT ADLT (ELECTROSURGICAL) ×2
ELECTRODE REM PT RTRN 9FT ADLT (ELECTROSURGICAL) ×1 IMPLANT
GLOVE BIOGEL PI IND STRL 7.0 (GLOVE) ×1 IMPLANT
GLOVE BIOGEL PI INDICATOR 7.0 (GLOVE) ×1
GLOVE SURG SIGNA 7.5 PF LTX (GLOVE) ×4 IMPLANT
GOWN STRL NON-REIN LRG LVL3 (GOWN DISPOSABLE) ×2 IMPLANT
GOWN STRL REIN XL XLG (GOWN DISPOSABLE) ×4 IMPLANT
HAND ACTIVATED (MISCELLANEOUS) IMPLANT
KIT BASIN OR (CUSTOM PROCEDURE TRAY) ×2 IMPLANT
MESH VENTRALIGHT ST 6IN CRC (Mesh General) ×1 IMPLANT
NDL INSUFFLATION 14GA 120MM (NEEDLE) IMPLANT
NDL SPNL 22GX3.5 QUINCKE BK (NEEDLE) ×1 IMPLANT
NEEDLE INSUFFLATION 14GA 120MM (NEEDLE) IMPLANT
NEEDLE SPNL 22GX3.5 QUINCKE BK (NEEDLE) ×2 IMPLANT
NS IRRIG 1000ML POUR BTL (IV SOLUTION) ×2 IMPLANT
PEN SKIN MARKING BROAD (MISCELLANEOUS) ×2 IMPLANT
PENCIL BUTTON HOLSTER BLD 10FT (ELECTRODE) ×1 IMPLANT
SCISSORS LAP 5X35 DISP (ENDOMECHANICALS) ×1 IMPLANT
SET IRRIG TUBING LAPAROSCOPIC (IRRIGATION / IRRIGATOR) IMPLANT
SOLUTION ANTI FOG 6CC (MISCELLANEOUS) ×2 IMPLANT
STRIP CLOSURE SKIN 1/2X4 (GAUZE/BANDAGES/DRESSINGS) ×4 IMPLANT
SUT MNCRL AB 4-0 PS2 18 (SUTURE) IMPLANT
SUT NOVA NAB DX-16 0-1 5-0 T12 (SUTURE) ×4 IMPLANT
TACKER 5MM HERNIA 3.5CML NAB (ENDOMECHANICALS) IMPLANT
TOWEL OR 17X26 10 PK STRL BLUE (TOWEL DISPOSABLE) ×2 IMPLANT
TRAY FOLEY CATH 14FRSI W/METER (CATHETERS) ×1 IMPLANT
TRAY LAP CHOLE (CUSTOM PROCEDURE TRAY) ×2 IMPLANT
TROCAR BLADELESS OPT 5 75 (ENDOMECHANICALS) ×3 IMPLANT
TROCAR XCEL BLUNT TIP 100MML (ENDOMECHANICALS) IMPLANT
TROCAR XCEL NON-BLD 11X100MML (ENDOMECHANICALS) ×2 IMPLANT
TUBING INSUFFLATION 10FT LAP (TUBING) ×2 IMPLANT

## 2012-02-21 NOTE — Transfer of Care (Signed)
Immediate Anesthesia Transfer of Care Note  Patient: Heather Stevenson  Procedure(s) Performed: Procedure(s) (LRB): LAPAROSCOPIC INCISIONAL HERNIA (N/A) INSERTION OF MESH (N/A)  Patient Location: PACU  Anesthesia Type: General  Level of Consciousness: sedated, patient cooperative and responds to stimulaton  Airway & Oxygen Therapy: Patient Spontanous Breathing and Patient connected to face mask oxgen  Post-op Assessment: Report given to PACU RN and Post -op Vital signs reviewed and stable  Post vital signs: Reviewed and stable  Complications: No apparent anesthesia complications

## 2012-02-21 NOTE — Op Note (Signed)
LAPAROSCOPIC INCISIONAL HERNIA, INSERTION OF MESH  Procedure Note  Heather Stevenson 02/21/2012   Pre-op Diagnosis: incisional hernia     Post-op Diagnosis: same  Procedure(s): LAPAROSCOPIC INCISIONAL HERNIA REPAIR INSERTION OF MESH (15.2 CM ROUND BARD)  Surgeon(s): Harl Bowie, MD  Anesthesia: General  Staff:  Ignacia Palma, RN - Circulator Felix Ahmadi, RN - [place] Person  Estimated Blood Loss: Minimal   findings: The patient was found to have an incisional hernia at the umbilicus. There were no adhesions of omentum or bowel involved with the hernia. It was repaired with a 15.2 cm round Bard V-patch  Procedure: The patient was brought to the operating room and identified as the correct patient. She was placed supine on the operating room table and general anesthesia was induced. Her abdomen was then prepped and draped in the usual sterile fashion. I made a small incision in the left upper quadrant with a scalpel. I then used the 5 mm trocar and scope to slowly traverse all layers of the abdominal wall and muscle layers and into the peritoneal cavity under direct vision. Insufflation of the abdomen was begun. I examined the entrance site with the camera and saw no evidence of bowel injury. I then placed a 11 mm trocar the patient's left mid quadrant under direct vision. I then placed 2 5 mm ports in the patient's right side of the abdomen under direct vision as well.      I evaluated the fascial defect. It was moderate size at the level of the umbilicus. There were no attachments of omentum or bowel in the hernia. I measured the defect. I then brought a 15.2 cm round piece of Bard mesh onto the field. I placed 4 separate 0 Novafil sutures in the corners of the mesh. The mesh was then rolled up and placed at the trocar and into the abdominal cavity. I made 4 separate skin incisions and then using the suture passer pulled the sutures up  through all layers of the abdominal wall.  all the sutures were then tied in place pulling the mesh up against the peritoneal surface. Adequate coverage circumferentially of the fascial defect appeared to be achieved. I then tacked the mesh and circumferentially with absorbable tacks. The mesh appeared to lay appropriately and appeared secure. I again examined the abdominal cavity. No evidence of bowel injury was identified and hemostasis appeared to be achieved. All ports were removed under direct vision and the abdomen was deflated. I anesthetized all incisions with Marcaine. I then closed all incisions with 4-0 Monocryl subcuticular sutures. Steri-Strips and Band-Aids were then applied. The patient tolerated the procedure well. All the counts were correct at the end of the procedure. The patient was then extubated in the operating room and taken in a stable condition to the recovery room.  Heather Stevenson A   Date: 02/21/2012  Time: 10:26 AM

## 2012-02-21 NOTE — Interval H&P Note (Signed)
History and Physical Interval Note:  No change in h and p  02/21/2012 7:58 AM  Heather Stevenson  has presented today for surgery, with the diagnosis of incisional hernia  The various methods of treatment have been discussed with the patient and family. After consideration of risks, benefits and other options for treatment, the patient has consented to  Procedure(s) (LRB): LAPAROSCOPIC INCISIONAL HERNIA (N/A) INSERTION OF MESH (N/A) as a surgical intervention .  The patient's history has been reviewed, patient examined, no change in status, stable for surgery.  I have reviewed the patient's chart and labs.  Questions were answered to the patient's satisfaction.     [name] A

## 2012-02-21 NOTE — Anesthesia Preprocedure Evaluation (Signed)
Anesthesia Evaluation  Patient identified by MRN, date of birth, ID band Patient awake    Reviewed: Allergy & Precautions, H&P , NPO status , Patient's Chart, lab work & pertinent test results  Airway Mallampati: II TM Distance: >3 FB Neck ROM: Full    Dental No notable dental hx.    Pulmonary neg pulmonary ROS, COPDCurrent Smoker,  breath sounds clear to auscultation  Pulmonary exam normal       Cardiovascular negative cardio ROS  + dysrhythmias Atrial Fibrillation Rhythm:Regular Rate:Normal  4.8 cm AAA   Neuro/Psych negative neurological ROS  negative psych ROS   GI/Hepatic negative GI ROS, Neg liver ROS,   Endo/Other  negative endocrine ROSMorbid obesity  Renal/GU negative Renal ROS  negative genitourinary   Musculoskeletal negative musculoskeletal ROS (+)   Abdominal   Peds negative pediatric ROS (+)  Hematology negative hematology ROS (+)   Anesthesia Other Findings   Reproductive/Obstetrics negative OB ROS                           Anesthesia Physical Anesthesia Plan  ASA: III  Anesthesia Plan: General   Post-op Pain Management:    Induction: Intravenous  Airway Management Planned: Oral ETT  Additional Equipment:   Intra-op Plan:   Post-operative Plan: Extubation in OR  Informed Consent: I have reviewed the patients History and Physical, chart, labs and discussed the procedure including the risks, benefits and alternatives for the proposed anesthesia with the patient or authorized representative who has indicated his/her understanding and acceptance.   Dental advisory given  Plan Discussed with: CRNA  Anesthesia Plan Comments:         Anesthesia Quick Evaluation

## 2012-02-21 NOTE — Anesthesia Postprocedure Evaluation (Signed)
  Anesthesia Post-op Note  Patient: Heather Stevenson  Procedure(s) Performed: Procedure(s) (LRB): LAPAROSCOPIC INCISIONAL HERNIA (N/A) INSERTION OF MESH (N/A)  Patient Location: PACU  Anesthesia Type: General  Level of Consciousness: awake and alert   Airway and Oxygen Therapy: Patient Spontanous Breathing  Post-op Pain: mild  Post-op Assessment: Post-op Vital signs reviewed, Patient's Cardiovascular Status Stable, Respiratory Function Stable, Patent Airway and No signs of Nausea or vomiting  Post-op Vital Signs: stable  Complications: No apparent anesthesia complications

## 2012-02-22 ENCOUNTER — Encounter (HOSPITAL_COMMUNITY): Payer: Self-pay | Admitting: Surgery

## 2012-02-22 MED ORDER — HYDROCODONE-ACETAMINOPHEN 5-325 MG PO TABS: 1.0000 | ORAL_TABLET | ORAL | Status: AC | PRN

## 2012-02-22 NOTE — Progress Notes (Signed)
Spoke with MD Marcello Moores who was covering for [place] today, regarding patient's IV patient is not getting IV pain medication or anything other medication IV, pt with no complaints of pain this shift, patient tolerating regular diet and voiding adequately, orders received to discontinue patient's IV Means, Veronda Prude RN 02-22-11 17:12pm

## 2012-02-22 NOTE — Care Management Note (Signed)
    Page 1 of 1   02/22/2012     11:15:03 AM   CARE MANAGEMENT NOTE 02/22/2012  Patient:  Heather Stevenson, Heather Stevenson   Account Number:  [id number]  Date Initiated:  02/22/2012  Documentation initiated by:  Sunday Spillers  Subjective/Objective Assessment:   78 yo female admitted s/p lap hernia repair. PTA lived at home alone.     Action/Plan:   Anticipated DC Date:  02/22/2012   Anticipated DC Plan:  [place]  CM consult      Choice offered to / List presented to:             Status of service:  Completed, signed off Medicare Important Message given?   (If response is "NO", the following Medicare IM given date fields will be blank) Date Medicare IM given:   Date Additional Medicare IM given:    Discharge Disposition:  HOME/SELF CARE  Per UR Regulation:  Reviewed for med. necessity/level of care/duration of stay  If discussed at [place] of Stay Meetings, dates discussed:    Comments:

## 2012-02-22 NOTE — Progress Notes (Signed)
1 Day Post-Op  Subjective: POD#1 Complains of moderate soreness especially when trying to get up and out of bed hungry  Objective: Vital signs in last 24 hours: Temp:  [97.7 F (36.5 C)-98.9 F (37.2 C)] 98.5 F (36.9 C) (08/15 0525) Pulse Rate:  [54-74] 58  (08/15 0525) Resp:  [12-20] 18  (08/15 0525) BP: (102-180)/(53-94) 102/53 mmHg (08/15 0525) SpO2:  [93 %-99 %] 96 % (08/15 0525) Weight:  [226 lb (102.513 kg)] 226 lb (102.513 kg) (08/14 1143) Last BM Date: 02/19/12  Intake/Output from previous day: 08/14 0701 - 08/15 0700 In: 1983.8 [I.V.:1983.8] Out: 800 [Urine:800] Intake/Output this shift:    Lungs clear CV RRR Abdomen soft, dressing dry  Lab Results:  No results found for this basename: WBC:2,HGB:2,HCT:2,PLT:2 in the last 72 hours BMET No results found for this basename: NA:2,K:2,CL:2,CO2:2,GLUCOSE:2,BUN:2,CREATININE:2,CALCIUM:2 in the last 72 hours PT/INR No results found for this basename: LABPROT:2,INR:2 in the last 72 hours ABG No results found for this basename: PHART:2,PCO2:2,PO2:2,HCO3:2 in the last 72 hours  Studies/Results: No results found.  Anti-infectives: Anti-infectives     Start     Dose/Rate Route Frequency Ordered Stop   02/21/12 0804   ceFAZolin (ANCEF) IVPB 2 g/50 mL premix        2 g 100 mL/hr over 30 Minutes Intravenous 60 min pre-op 02/21/12 0804 02/21/12 0935          Assessment/Plan: s/p Procedure(s) (LRB): LAPAROSCOPIC INCISIONAL HERNIA (N/A) INSERTION OF MESH (N/A)  Home later today vs. Tomorrow am depending on pain control, activity level  LOS: 1 day    Andrell Bergeson A 02/22/2012

## 2012-02-23 NOTE — Progress Notes (Signed)
Patient ID: Heather Stevenson, female   DOB: [birth year], 78 y.o.   MRN: [id number] POD#2  Feels much better today.  Pain less.  Wants to go home Abdomen is soft and dressings are dry  Plan: Discharge home

## 2012-02-23 NOTE — Discharge Summary (Signed)
Physician Discharge Summary  Patient ID: Heather Stevenson MRN: [id number] DOB/AGE: 08/14/1929 78 y.o.  Admit date: 02/21/2012 Discharge date: 02/23/2012  Admission Diagnoses: incisional hernia  Discharge Diagnoses: same Active Problems:  * No active hospital problems. *    Discharged Condition: good  Hospital Course: admitted for elective surgery.  Uneventful post op recovery.  Discharged POD#2  Consults: None  Significant Diagnostic Studies:   Treatments: surgery: lap incisional hernia repair with mesh  Discharge Exam: Blood pressure 111/69, pulse 93, temperature 97.2 F (36.2 C), temperature source Oral, resp. rate 18, height _0  (1.676 m), weight 226 lb (102.513 kg), SpO2 90.00%. General appearance: alert and no distress Resp: clear to auscultation bilaterally Incision/Wound: abdomen soft, incisions clean  Disposition:   Discharge Orders    Future Appointments: Provider: Department: Dept Phone: Center:   03/05/2012 10:50 AM Harl Bowie, MD [facility] [place] [phone] None   03/12/2012 12:30 PM [facility] [phone] [facility]   03/12/2012 1:30 PM Mal Misty, MD [facility] [phone] [facility]     Medication List  As of 02/23/2012  6:42 AM   TAKE these medications         allopurinol 300 MG tablet   Commonly known as: ZYLOPRIM   Take 300 mg by mouth daily.      aspirin EC 325 MG tablet   Take 325 mg by mouth at bedtime.      CENTRUM tablet   Take 1 tablet by mouth daily.      citalopram 20 MG tablet   Commonly known as: CELEXA   Take 20 mg by mouth at bedtime.      [employer] VIT D 5000 HIGH-POTENCY PO   Take 5,000 Units by mouth daily.      fenofibrate micronized 134 MG capsule   Commonly known as: LOFIBRA   Take 134 mg by mouth daily at 12 noon.      fish oil-omega-3 fatty acids 1000 MG capsule   Take 1 g by mouth 2 (two) times daily.      Flax Seed Oil 1000 MG Caps   Take 1,000 mg by mouth 2 (two) times daily.     HYDROcodone-acetaminophen 5-325 MG per tablet   Commonly known as: NORCO/VICODIN   Take 1-2 tablets by mouth every 4 (four) hours as needed.      Magnesium 250 MG Tabs   Take 250 mg by mouth daily.      meloxicam 15 MG tablet   Commonly known as: MOBIC   Take 15 mg by mouth daily.      metoprolol tartrate 25 MG tablet   Commonly known as: LOPRESSOR   Take 12.5 mg by mouth 2 (two) times daily.      rosuvastatin 5 MG tablet   Commonly known as: CRESTOR   Take 5 mg by mouth at bedtime.      VITAMIN B 12 PO   Take 500 mcg by mouth daily.      vitamin C 500 MG tablet   Commonly known as: ASCORBIC ACID   Take 500 mg by mouth daily.           Follow-up Information    Follow up with [facility] A, MD. Call in 3 weeks.   Contact information:   [address] [place]          Signed: Harl Bowie 02/23/2012, 6:42 AM

## 2012-02-23 NOTE — Progress Notes (Signed)
Patient discharged home in stable condition with daughter.  Discharge teaching and instructions given to patient and daughter with verbal feedback and understanding.

## 2012-03-05 ENCOUNTER — Ambulatory Visit (INDEPENDENT_AMBULATORY_CARE_PROVIDER_SITE_OTHER): Payer: Medicare Other | Admitting: Surgery

## 2012-03-05 ENCOUNTER — Ambulatory Visit: Payer: Medicare Other | Admitting: Vascular Surgery

## 2012-03-05 ENCOUNTER — Encounter (INDEPENDENT_AMBULATORY_CARE_PROVIDER_SITE_OTHER): Payer: Self-pay | Admitting: Surgery

## 2012-03-05 VITALS — BP 140/83 | HR 62 | Temp 98.9°F | Resp 18 | Ht 66.0 in | Wt 224.8 lb

## 2012-03-05 DIAGNOSIS — Z09 Encounter for follow-up examination after completed treatment for conditions other than malignant neoplasm: Secondary | ICD-10-CM

## 2012-03-05 NOTE — Progress Notes (Signed)
Subjective:     Patient ID: Heather Stevenson, female   DOB: 06-27-1930, 78 y.o.   MRN: [id number]  HPI She is here for first postop visit status post laparoscopic incisional hernia repair with mesh. She is doing well has no complaints. She reports that she has quit smoking  Review of Systems     Objective:   Physical Exam    Her incisions are well healed and there is no evidence of recurrence Assessment:     Patient stable postop    Plan:     She will refrain from heavy lifting for 2 more weeks. I will see her back as needed

## 2012-03-12 ENCOUNTER — Other Ambulatory Visit: Payer: Medicare Other

## 2012-03-12 ENCOUNTER — Ambulatory Visit: Payer: Medicare Other | Admitting: Vascular Surgery

## 2012-12-30 ENCOUNTER — Encounter: Payer: Self-pay | Admitting: Vascular Surgery

## 2012-12-31 ENCOUNTER — Ambulatory Visit: Payer: Medicare Other | Admitting: Vascular Surgery

## 2012-12-31 ENCOUNTER — Ambulatory Visit
Admission: RE | Admit: 2012-12-31 | Discharge: 2012-12-31 | Disposition: A | Discharge status: Home / Self Care | Payer: Medicare Other | Source: Ambulatory Visit | Attending: Vascular Surgery | Admitting: Vascular Surgery

## 2012-12-31 ENCOUNTER — Ambulatory Visit (INDEPENDENT_AMBULATORY_CARE_PROVIDER_SITE_OTHER): Payer: Medicare Other | Admitting: Vascular Surgery

## 2012-12-31 ENCOUNTER — Encounter: Payer: Self-pay | Admitting: Vascular Surgery

## 2012-12-31 VITALS — BP 126/73 | HR 63 | Resp 16 | Ht 66.0 in | Wt 223.0 lb

## 2012-12-31 DIAGNOSIS — I714 Abdominal aortic aneurysm, without rupture: Secondary | ICD-10-CM

## 2012-12-31 DIAGNOSIS — Z48812 Encounter for surgical aftercare following surgery on the circulatory system: Secondary | ICD-10-CM

## 2012-12-31 MED ORDER — IOHEXOL 350 MG/ML SOLN
80.0000 mL | Freq: Once | INTRAVENOUS | Status: AC | PRN
  Administered 2012-12-31: 80 mL via INTRAVENOUS

## 2012-12-31 NOTE — Progress Notes (Signed)
Subjective:     Patient ID: Heather Stevenson, female   DOB: 1929-07-17, 79 y.o.   MRN: [id number]  HPI this 79 year old female is seen today for followup regarding her known abdominal aortic aneurysm. She was last seen by me 2 years ago at which time the aneurysm measured 4.8 cm in maximum diameter. Today she had a CT scan which I have reviewed this reveals the aneurysm to now be 5.6 cm in maximal diameter. It extends up to the renal arteries. Patient has a history of small bowel obstruction requiring small bowel resection and subsequently had a umbilical hernia repair with a piece of Bard mesh-15 cm in diameter by Dr. Rush Farmer in August of 2013. The patient has chronic tobacco abuse but no history of coronary artery disease, or stroke.  Past Medical History  Diagnosis Date  . Hyperlipidemia   . Gout   . History of small bowel obstruction   . COPD (chronic obstructive pulmonary disease)   . Substance abuse     Tobacco  . Depression   . Pulmonary hypertension   . Umbilical hernia   . Bronchitis   . Atrial fibrillation   . High cholesterol   . Pneumonia   . Arthritis   . Abdominal aortic aneurysm     followed by Dr Kellie Simmering- 4.8 cm per [place] 6/12- [facility]  . Complication of anesthesia     slow to wake up per pt- anesthesia record on chart    History  Substance Use Topics  . Smoking status: Former Smoker -- 0.50 packs/day for 50 years    Types: Cigarettes    Quit date: 02/07/2012  . Smokeless tobacco: Never Used  . Alcohol Use: No    Family History  Problem Relation Age of Onset  . Cancer Mother   . Heart disease Father   . Cancer Daughter   . Deep vein thrombosis Daughter     No Known Allergies  Current outpatient prescriptions:allopurinol (ZYLOPRIM) 300 MG tablet, Take 300 mg by mouth daily. , Disp: , Rfl: ;  aspirin EC 325 MG tablet, Take 325 mg by mouth at bedtime., Disp: , Rfl: ;  Cholecalciferol ([employer] VIT D 5000 HIGH-POTENCY PO), Take 5,000 Units by mouth daily. , Disp: , Rfl: ;   citalopram (CELEXA) 20 MG tablet, Take 20 mg by mouth at bedtime. , Disp: , Rfl:  Cyanocobalamin (VITAMIN B 12 PO), Take 500 mcg by mouth daily. , Disp: , Rfl: ;  fenofibrate micronized (LOFIBRA) 134 MG capsule, Take 134 mg by mouth daily at 12 noon. , Disp: , Rfl: ;  fish oil-omega-3 fatty acids 1000 MG capsule, Take 1 g by mouth 2 (two) times daily., Disp: , Rfl: ;  Flaxseed, Linseed, (FLAX SEED OIL) 1000 MG CAPS, Take 1,000 mg by mouth 2 (two) times daily. , Disp: , Rfl:  Magnesium 250 MG TABS, Take 250 mg by mouth daily., Disp: , Rfl: ;  meloxicam (MOBIC) 15 MG tablet, Take 15 mg by mouth daily. , Disp: , Rfl: ;  metoprolol tartrate (LOPRESSOR) 25 MG tablet, Take 12.5 mg by mouth 2 (two) times daily. , Disp: , Rfl: ;  Multiple Vitamins-Minerals (CENTRUM) tablet, Take 1 tablet by mouth daily. , Disp: , Rfl: ;  ranitidine (ZANTAC) 300 MG tablet, , Disp: , Rfl:  rosuvastatin (CRESTOR) 5 MG tablet, Take 5 mg by mouth at bedtime. , Disp: , Rfl: ;  traZODone (DESYREL) 150 MG tablet, , Disp: , Rfl: ;  vitamin C (ASCORBIC ACID) 500  MG tablet, Take 500 mg by mouth daily. , Disp: , Rfl:   BP 126/73  Pulse 63  Resp 16  Ht _0  (1.676 m)  Wt 223 lb (101.152 kg)  BMI 36.01 kg/m2  SpO2 95%  Body mass index is 36.01 kg/(m^2).           Review of Systems denies chest pain but does have dyspnea on exertion. Also complains of pain in legs with ambulation and chronic bronchitis. Smokes between one half and three fourths pack of cigarettes per day for 60 years. Denies lateralizing weakness, speech problems, syncope.     Objective:   Physical Exam blood pressure 126/73 heart rate 63 respirations 16 Gen.-alert and oriented x3 in no apparent distress HEENT normal for age Lungs mild bilateral rhonchi-no wheezing Cardiovascular regular rhythm no murmurs carotid pulses 3+ palpable no bruits audible Abdomen soft nontender no palpable masses-5 cm pulsatile mass in the mid epigastrium. Small incision in  the periumbilical region but no midline incision noted  Musculoskeletal free of  major deformities Skin clear -no rashes Neurologic normal Lower extremities 3+ femoral and dorsalis pedis pulses palpable bilaterally with no edema  Today I ordered a CT angiogram which I reviewed and interpreted. The aneurysm now is 5.6 cm in maximum diameter. It extends up to the renal arteries. It is not a candidate for stent grafting because of proximity to the renal arteries       Assessment:     5.6 cm abdominal aortic aneurysm extending up to and slightly proximal to renal arteries 10 months post peri-umbilical hernia repair with Bard mesh by Dr. Rush Farmer with history of small bowel resection from intestinal obstruction    Plan:     Obtain Cardiolite exam this week Patient to return in 2 weeks for further discussion regarding resection and grafting of abdominal aortic aneurysm-tentatively scheduled for July 16 Patient strongly encouraged to discontinue or minimize smoking over the next 3 weeks preoperatively to decrease likelihood of pulmonary morbidity

## 2012-12-31 NOTE — Addendum Note (Signed)
Addended by: Dorthula Rue L on: 12/31/2012 03:51 PM   Modules accepted: Orders

## 2013-01-01 ENCOUNTER — Telehealth: Payer: Self-pay | Admitting: Vascular Surgery

## 2013-01-01 NOTE — Telephone Encounter (Signed)
Appointment for Cardiolite has been scheduled with [facility] on 01/03/13 at 7:45am. Instructions include- nothing to eat or drink two hours prior, and NO caffeine or decaf products, including chocolate 12 hours prior, do not wear scented lotion, cologne or perfumes and be sure to wear comfortable clothing.  Office information: [address] [phone]   I spoke with Heather Stevenson this morning to inform her of the above information. She is aware and will be at the appointment on 01/03/13 @ 7:45am.

## 2013-01-02 ENCOUNTER — Telehealth: Payer: Self-pay | Admitting: Vascular Surgery

## 2013-01-02 ENCOUNTER — Telehealth: Payer: Self-pay

## 2013-01-02 DIAGNOSIS — I714 Abdominal aortic aneurysm, without rupture: Secondary | ICD-10-CM

## 2013-01-02 NOTE — Telephone Encounter (Signed)
Spoke with Margarette Canada, gave her new fu appt info - kf

## 2013-01-02 NOTE — Telephone Encounter (Signed)
Pt. called and stated she has decided not to go through with the surgery for her AAA.  Pt. has a Cardiolite Stress Test scheduled at 7:45 AM 6/27.  Questions if she needs to go through with the stress test.  Advised pt. that if she wavered at all on going through with the repair of her AAA, she would need to have the stress test.  Pt. Stated emphatically "I'm not changing my mind about having the surgery."  Notified Dr. Kellie Simmering.  Advised to tell pt. that in his opinion, because the AAA has grown significantly, she is at risk for the aneurysm to rupture, and therefore, feels it is a mistake not to surgically repair it.  If pt. still refuses to have the surgery, schedule a 6 mo. Abdominal ultrasound and office visit to monitor for change in size.  Phone call ret'd to pt.  Advised of Dr. Evelena Leyden comments and recommendations regarding her aneurysm.  Pt. stated she will cancel her stress test, and the appt. on 01/14/13 to f/u with Dr. Kellie Simmering.  Agreed to scheduling a 6 mo. F/u abdominal ultrasound and office visit.

## 2013-01-03 ENCOUNTER — Encounter (HOSPITAL_COMMUNITY): Payer: Medicare Other

## 2013-01-14 ENCOUNTER — Ambulatory Visit: Payer: Medicare Other | Admitting: Vascular Surgery

## 2013-01-30 ENCOUNTER — Other Ambulatory Visit: Payer: Medicare Other

## 2013-04-06 ENCOUNTER — Other Ambulatory Visit (HOSPITAL_COMMUNITY): Payer: Self-pay | Admitting: Physician Assistant

## 2013-04-06 ENCOUNTER — Other Ambulatory Visit (HOSPITAL_COMMUNITY): Payer: Medicare Other

## 2013-04-06 DIAGNOSIS — R05 Cough: Secondary | ICD-10-CM

## 2013-04-07 ENCOUNTER — Ambulatory Visit (HOSPITAL_COMMUNITY)
Admission: RE | Admit: 2013-04-07 | Discharge: 2013-04-07 | Disposition: A | Discharge status: Home / Self Care | Payer: Medicare Other | Source: Ambulatory Visit | Attending: Physician Assistant | Admitting: Physician Assistant

## 2013-04-07 ENCOUNTER — Other Ambulatory Visit (HOSPITAL_COMMUNITY): Payer: Self-pay | Admitting: Physician Assistant

## 2013-04-07 DIAGNOSIS — J42 Unspecified chronic bronchitis: Secondary | ICD-10-CM | POA: Insufficient documentation

## 2013-04-07 DIAGNOSIS — I700 Atherosclerosis of aorta: Secondary | ICD-10-CM | POA: Insufficient documentation

## 2013-04-07 DIAGNOSIS — J441 Chronic obstructive pulmonary disease with (acute) exacerbation: Secondary | ICD-10-CM

## 2013-04-07 DIAGNOSIS — R059 Cough, unspecified: Secondary | ICD-10-CM | POA: Insufficient documentation

## 2013-04-07 DIAGNOSIS — R0602 Shortness of breath: Secondary | ICD-10-CM | POA: Insufficient documentation

## 2013-04-07 DIAGNOSIS — R05 Cough: Secondary | ICD-10-CM | POA: Insufficient documentation

## 2013-06-25 ENCOUNTER — Encounter: Payer: Self-pay | Admitting: Internal Medicine

## 2013-06-26 ENCOUNTER — Ambulatory Visit (INDEPENDENT_AMBULATORY_CARE_PROVIDER_SITE_OTHER): Payer: Medicare Other | Admitting: Internal Medicine

## 2013-06-26 ENCOUNTER — Encounter: Payer: Self-pay | Admitting: Internal Medicine

## 2013-06-26 VITALS — BP 128/66 | HR 64 | Temp 97.3°F | Resp 18 | Wt 229.0 lb

## 2013-06-26 DIAGNOSIS — E785 Hyperlipidemia, unspecified: Secondary | ICD-10-CM | POA: Insufficient documentation

## 2013-06-26 DIAGNOSIS — J449 Chronic obstructive pulmonary disease, unspecified: Secondary | ICD-10-CM | POA: Insufficient documentation

## 2013-06-26 DIAGNOSIS — Z79899 Other long term (current) drug therapy: Secondary | ICD-10-CM

## 2013-06-26 DIAGNOSIS — I714 Abdominal aortic aneurysm, without rupture: Secondary | ICD-10-CM | POA: Insufficient documentation

## 2013-06-26 DIAGNOSIS — I10 Essential (primary) hypertension: Secondary | ICD-10-CM

## 2013-06-26 DIAGNOSIS — Z8719 Personal history of other diseases of the digestive system: Secondary | ICD-10-CM | POA: Insufficient documentation

## 2013-06-26 DIAGNOSIS — R7309 Other abnormal glucose: Secondary | ICD-10-CM

## 2013-06-26 DIAGNOSIS — E559 Vitamin D deficiency, unspecified: Secondary | ICD-10-CM

## 2013-06-26 NOTE — Patient Instructions (Signed)
Continue diet & medications same as discussed.   Further disposition pending lab results.    Chronic Obstructive Pulmonary Disease Chronic obstructive pulmonary disease (COPD) is a condition in which airflow from the lungs is restricted. The lungs can never return to normal, but there are measures you can take which will improve them and make you feel better. CAUSES   Smoking.  Exposure to secondhand smoke.  Breathing in irritants such as air pollution, dust, cigarette smoke, strong odors, aerosol sprays, or paint fumes.  History of lung infections. SYMPTOMS   Deep, persistent (chronic) cough with a large amount of thick mucus.  Wheezing.  Shortness of breath, especially with physical activity.  Feeling like you cannot get enough air.  Difficulty breathing.  Rapid breaths (tachypnea).  Gray or bluish discoloration (cyanosis) of the skin, especially in fingers, toes, or lips.  Fatigue.  Weight loss.  Swelling in legs, ankles, or feet.  Fast heartbeat (tachycardia).  Frequent lung infections.   Chest tightness. DIAGNOSIS  Initial diagnosis may be based on your history, symptoms, and physical examination. Additional tests for COPD may include:  Chest X-ray.  Computed tomography (CT) scan.  Lung (pulmonary) function tests.  Blood tests. TREATMENT  Treatment focuses on making you comfortable (supportive care). Your caregiver may prescribe medicines (inhaled or pills) to help improve your breathing. Additional treatment options may include oxygen therapy and pulmonary rehabilitation. Treatment should also include reducing your exposure to known irritants and following a plan to stop smoking. HOME CARE INSTRUCTIONS   Take all medicines, including antibiotic medicines, as directed by your caregiver.  Use inhaled medicines as directed by your caregiver.  Avoid medicines or cough syrups that dry up your airway (antihistamines) and slow down the elimination of  secretions. This decreases respiratory capacity and may lead to infections.  If you smoke, stop smoking.  Avoid exposure to smoke, chemicals, and fumes that aggravate your breathing.  Avoid contact with individuals that have a contagious illness.  Avoid extreme temperature and humidity changes.  Use humidifiers at home and at your bedside if they do not make breathing difficult.  Drink enough water and fluids to keep your urine clear or pale yellow. This loosens secretions.  Eat healthy foods. Eating smaller, more frequent meals and resting before meals may help you maintain your strength.  Ask your caregiver about the use of vitamins and mineral supplements.  Stay active. Exercise and physical activity will help maintain your ability to do things you want to do.  Balance activity with periods of rest.  Assume a position of comfort if you become short of breath.  Learn and use relaxation techniques.  Learn and use controlled breathing techniques as directed by your caregiver. Controlled breathing techniques include:  Pursed lip breathing. This breathing technique starts with breathing in (inhaling) through your nose for 1 second. Next, purse your lips as if you were going to whistle. Then breathe out (exhale) through the pursed lips for 2 seconds.  Diaphragmatic breathing. Start by putting one hand on your abdomen just above your waist. Inhale slowly through your nose. The hand on your abdomen should move out. Then exhale slowly through pursed lips. You should be able to feel the hand on your abdomen moving in as you exhale.  Learn and use controlled coughing to clear mucus from your lungs. Controlled coughing is a series of short, progressive coughs. The steps of controlled coughing are: 1. Lean your head slightly forward. 2. Breathe in deeply using diaphragmatic breathing. 3.  Try to hold your breath for 3 seconds. 4. Keep your mouth slightly open while coughing twice. 5. Spit  any mucus out into a tissue. 6. Rest and repeat the steps once or twice as needed.  Receive all protective vaccines your caregiver suggests, especially pneumococcal and influenza vaccines.  Learn to manage stress.  Schedule and attend all follow-up appointments as directed by your caregiver. It is important to keep all your appointments.  Participate in pulmonary rehabilitation as directed by your caregiver.  Use home oxygen as suggested. SEEK MEDICAL CARE IF:   You are coughing up more mucus than usual.  There is a change in the color or thickness of the mucus.  Breathing is more labored than usual.  Your breathing is faster than usual.  Your skin color is more cyanotic than usual.  You are running out of the medicine you take for your breathing.  You are anxious, apprehensive, or restless.  You have a fever. SEEK IMMEDIATE MEDICAL CARE IF:   You have a rapid heart rate.  You have shortness of breath while you are resting.  You have shortness of breath that prevents you from being able to talk.  You have shortness of breath that prevents you from performing your usual physical activities.  You have chest pain lasting longer than 5 minutes.  You have a seizure.  Your family or friends notice that you are agitated or confused. MAKE SURE YOU:   Understand these instructions.  Will watch your condition.  Will get help right away if you are not doing well or get worse. Document Released: 04/05/2005 Document Revised: 03/20/2012 Document Reviewed: 02/20/2013 [facility] Patient Information 2014 [place].   Hypertension As your heart beats, it forces blood through your arteries. This force is your blood pressure. If the pressure is too high, it is called hypertension (HTN) or high blood pressure. HTN is dangerous because you may have it and not know it. High blood pressure may mean that your heart has to work harder to pump blood. Your arteries may be narrow or  stiff. The extra work puts you at risk for heart disease, stroke, and other problems.  Blood pressure consists of two numbers, a higher number over a lower, 110/72, for example. It is stated as "110 over 72." The ideal is below 120 for the top number (systolic) and under 80 for the bottom (diastolic). Write down your blood pressure today. You should pay close attention to your blood pressure if you have certain conditions such as:  Heart failure.  Prior heart attack.  Diabetes  Chronic kidney disease.  Prior stroke.  Multiple risk factors for heart disease. To see if you have HTN, your blood pressure should be measured while you are seated with your arm held at the level of the heart. It should be measured at least twice. A one-time elevated blood pressure reading (especially in the Emergency Department) does not mean that you need treatment. There may be conditions in which the blood pressure is different between your right and left arms. It is important to see your caregiver soon for a recheck. Most people have essential hypertension which means that there is not a specific cause. This type of high blood pressure may be lowered by changing lifestyle factors such as:  Stress.  Smoking.  Lack of exercise.  Excessive weight.  Drug/tobacco/alcohol use.  Eating less salt. Most people do not have symptoms from high blood pressure until it has caused damage to the body.  Effective treatment can often prevent, delay or reduce that damage. TREATMENT  When a cause has been identified, treatment for high blood pressure is directed at the cause. There are a large number of medications to treat HTN. These fall into several categories, and your caregiver will help you select the medicines that are best for you. Medications may have side effects. You should review side effects with your caregiver. If your blood pressure stays high after you have made lifestyle changes or started on medicines,    Your medication(s) may need to be changed.  Other problems may need to be addressed.  Be certain you understand your prescriptions, and know how and when to take your medicine.  Be sure to follow up with your caregiver within the time frame advised (usually within two weeks) to have your blood pressure rechecked and to review your medications.  If you are taking more than one medicine to lower your blood pressure, make sure you know how and at what times they should be taken. Taking two medicines at the same time can result in blood pressure that is too low. SEEK IMMEDIATE MEDICAL CARE IF:  You develop a severe headache, blurred or changing vision, or confusion.  You have unusual weakness or numbness, or a faint feeling.  You have severe chest or abdominal pain, vomiting, or breathing problems. MAKE SURE YOU:   Understand these instructions.  Will watch your condition.  Will get help right away if you are not doing well or get worse. Document Released: 06/26/2005 Document Revised: 09/18/2011 Document Reviewed: 02/14/2008 [facility] Patient Information 2014 [place]. Diabetes and Exercise Exercising regularly is important. It is not just about losing weight. It has many health benefits, such as:  Improving your overall fitness, flexibility, and endurance.  Increasing your bone density.  Helping with weight control.  Decreasing your body fat.  Increasing your muscle strength.  Reducing stress and tension.  Improving your overall health. People with diabetes who exercise gain additional benefits because exercise:  Reduces appetite.  Improves the body's use of blood sugar (glucose).  Helps lower or control blood glucose.  Decreases blood pressure.  Helps control blood lipids (such as cholesterol and triglycerides).  Improves the body's use of the hormone insulin by:  Increasing the body's insulin sensitivity.  Reducing the body's insulin  needs.  Decreases the risk for heart disease because exercising:  Lowers cholesterol and triglycerides levels.  Increases the levels of good cholesterol (such as high-density lipoproteins [HDL]) in the body.  Lowers blood glucose levels. YOUR ACTIVITY PLAN  Choose an activity that you enjoy and set realistic goals. Your health care provider or diabetes educator can help you make an activity plan that works for you. You can break activities into 2 or 3 sessions throughout the day. Doing so is as good as one long session. Exercise ideas include:  Taking the dog for a walk.  Taking the stairs instead of the elevator.  Dancing to your favorite song.  Doing your favorite exercise with a friend. RECOMMENDATIONS FOR EXERCISING WITH TYPE 1 OR TYPE 2 DIABETES   Check your blood glucose before exercising. If blood glucose levels are greater than 240 mg/dL, check for urine ketones. Do not exercise if ketones are present.  Avoid injecting insulin into areas of the body that are going to be exercised. For example, avoid injecting insulin into:  The arms when playing tennis.  The legs when jogging.  Keep a record of:  Food intake before and  after you exercise.  Expected peak times of insulin action.  Blood glucose levels before and after you exercise.  The type and amount of exercise you have done.  Review your records with your health care provider. Your health care provider will help you to develop guidelines for adjusting food intake and insulin amounts before and after exercising.  If you take insulin or oral hypoglycemic agents, watch for signs and symptoms of hypoglycemia. They include:  Dizziness.  Shaking.  Sweating.  Chills.  Confusion.  Drink plenty of water while you exercise to prevent dehydration or heat stroke. Body water is lost during exercise and must be replaced.  Talk to your health care provider before starting an exercise program to make sure it is safe  for you. Remember, almost any type of activity is better than none. Document Released: 09/16/2003 Document Revised: 02/26/2013 Document Reviewed: 12/03/2012 [facility] Patient Information 2014 [place]. Cholesterol Cholesterol is a white, waxy, fat-like protein needed by your body in small amounts. The liver makes all the cholesterol you need. It is carried from the liver by the blood through the blood vessels. Deposits (plaque) may build up on blood vessel walls. This makes the arteries narrower and stiffer. Plaque increases the risk for heart attack and stroke. You cannot feel your cholesterol level even if it is very high. The only way to know is by a blood test to check your lipid (fats) levels. Once you know your cholesterol levels, you should keep a record of the test results. Work with your caregiver to to keep your levels in the desired range. WHAT THE RESULTS MEAN:  Total cholesterol is a rough measure of all the cholesterol in your blood.  LDL is the so-called bad cholesterol. This is the type that deposits cholesterol in the walls of the arteries. You want this level to be low.  HDL is the good cholesterol because it cleans the arteries and carries the LDL away. You want this level to be high.  Triglycerides are fat that the body can either burn for energy or store. High levels are closely linked to heart disease. DESIRED LEVELS: 7. Total cholesterol below 200. 8. LDL below 100 for people at risk, below 70 for very high risk. 9. HDL above 50 is good, above 60 is best. 10. Triglycerides below 150. HOW TO LOWER YOUR CHOLESTEROL:  Diet.  Choose fish or white meat chicken and [place], roasted or baked. Limit fatty cuts of red meat, fried foods, and processed meats, such as sausage and lunch meat.  Eat lots of fresh fruits and vegetables. Choose whole grains, beans, pasta, potatoes and cereals.  Use only small amounts of olive, corn or canola oils. Avoid butter, mayonnaise,  shortening or palm kernel oils. Avoid foods with trans-fats.  Use skim/nonfat milk and low-fat/nonfat yogurt and cheeses. Avoid whole milk, cream, ice cream, egg yolks and cheeses. Healthy desserts include angel food cake, ginger snaps, animal crackers, hard candy, popsicles, and low-fat/nonfat frozen yogurt. Avoid pastries, cakes, pies and cookies.  Exercise.  A regular program helps decrease LDL and raises HDL.  Helps with weight control.  Do things that increase your activity level like gardening, walking, or taking the stairs.  Medication.  May be prescribed by your caregiver to help lowering cholesterol and the risk for heart disease.  You may need medicine even if your levels are normal if you have several risk factors. HOME CARE INSTRUCTIONS   Follow your diet and exercise programs as suggested by your  caregiver.  Take medications as directed.  Have blood work done when your caregiver feels it is necessary. MAKE SURE YOU:   Understand these instructions.  Will watch your condition.  Will get help right away if you are not doing well or get worse. Document Released: 03/21/2001 Document Revised: 09/18/2011 Document Reviewed: 09/11/2007 [facility] Patient Information 2014 [place], [place]. Vitamin D Deficiency Vitamin D is an important vitamin that your body needs. Having too little of it in your body is called a deficiency. A very bad deficiency can make your bones soft and can cause a condition called rickets.  Vitamin D is important to your body for different reasons, such as:   It helps your body absorb 2 minerals called calcium and phosphorus.  It helps make your bones healthy.  It may prevent some diseases, such as diabetes and multiple sclerosis.  It helps your muscles and heart. You can get vitamin D in several ways. It is a natural part of some foods. The vitamin is also added to some dairy products and cereals. Some people take vitamin D supplements. Also, your  body makes vitamin D when you are in the sun. It changes the sun's rays into a form of the vitamin that your body can use. CAUSES   Not eating enough foods that contain vitamin D.  Not getting enough sunlight.  Having certain digestive system diseases that make it hard to absorb vitamin D. These diseases include Crohn's disease, chronic pancreatitis, and cystic fibrosis.  Having a surgery in which part of the stomach or small intestine is removed.  Being obese. Fat cells pull vitamin D out of your blood. That means that obese people may not have enough vitamin D left in their blood and in other body tissues.  Having chronic kidney or liver disease. RISK FACTORS Risk factors are things that make you more likely to develop a vitamin D deficiency. They include:  Being older.  Not being able to get outside very much.  Living in a nursing home.  Having had broken bones.  Having weak or thin bones (osteoporosis).  Having a disease or condition that changes how your body absorbs vitamin D.  Having dark skin.  Some medicines such as seizure medicines or steroids.  Being overweight or obese. SYMPTOMS Mild cases of vitamin D deficiency may not have any symptoms. If you have a very bad case, symptoms may include:  Bone pain.  Muscle pain.  Falling often.  Broken bones caused by a minor injury, due to osteoporosis. DIAGNOSIS A blood test is the best way to tell if you have a vitamin D deficiency. TREATMENT Vitamin D deficiency can be treated in different ways. Treatment for vitamin D deficiency depends on what is causing it. Options include:  Taking vitamin D supplements.  Taking a calcium supplement. Your caregiver will suggest what dose is best for you. HOME CARE INSTRUCTIONS  Take any supplements that your caregiver prescribes. Follow the directions carefully. Take only the suggested amount.  Have your blood tested 2 months after you start taking supplements.  Eat  foods that contain vitamin D. Healthy choices include:  Fortified dairy products, cereals, or juices. Fortified means vitamin D has been added to the food. Check the label on the package to be sure.  Fatty fish like salmon or trout.  Eggs.  Oysters.  Do not use a tanning bed.  Keep your weight at a healthy level. Lose weight if you need to.  Keep all follow-up appointments. Your  caregiver will need to perform blood tests to make sure your vitamin D deficiency is going away. SEEK MEDICAL CARE IF:  You have any questions about your treatment.  You continue to have symptoms of vitamin D deficiency.  You have nausea or vomiting.  You are constipated.  You feel confused.  You have severe abdominal or back pain. MAKE SURE YOU:  Understand these instructions.  Will watch your condition.  Will get help right away if you are not doing well or get worse. Document Released: 09/18/2011 Document Revised: 10/21/2012 Document Reviewed: 09/18/2011 [facility] Patient Information 2014 [place].

## 2013-06-26 NOTE — Progress Notes (Signed)
Patient ID: Heather Stevenson, female   DOB: February 13, 1930, 79 y.o.   MRN: [id number]   This very nice 79 y.o.female presents for 3 month follow up with Hypertension, COPD, Hyperlipidemia, Pre-Diabetes and Vitamin D Deficiency.    BP has been controlled at home. Today's BP is BP: 128/66 mmHg. Patient denies any cardiac type chest pain, palpitations, dyspnea/orthopnea/PND, dizziness, claudication, or dependent edema.   Hyperlipidemia is controlled with diet & meds. Last Cholesterol was 128, Triglycerides were 260(not at goal), HDL 31, and LDL 48-at goal. Patient denies myalgias or other med SE's.  Patient also has Copd and continues to smoke despite repeated pleas for smoking cessation. She is very inactive and denies any productive sputum or dyspnea.   Also, the patient has history of PreDiabetes/insulin resistance with last A1c of 6.3% and elevated insulin 71 in August. Patient denies any symptoms of reactive hypoglycemia, diabetic polys, paresthesias or visual blurring.   Further, Patient has history of Vitamin D Deficiency with last vitamin D of  57. Patient supplements vitamin D without any suspected side-effects.  Current Outpatient Prescriptions on File Prior to Visit  Medication Sig Dispense Refill  . allopurinol (ZYLOPRIM) 300 MG tablet Take 300 mg by mouth daily.       Marland Kitchen aspirin EC 325 MG tablet Take 325 mg by mouth at bedtime.      . Cholecalciferol ([employer] VIT D 5000 HIGH-POTENCY PO) Take 5,000 Units by mouth daily.       . citalopram (CELEXA) 20 MG tablet Take 20 mg by mouth at bedtime.       . Cyanocobalamin (VITAMIN B 12 PO) Take 500 mcg by mouth daily.       . fenofibrate micronized (LOFIBRA) 134 MG capsule Take 134 mg by mouth daily at 12 noon.       . fish oil-omega-3 fatty acids 1000 MG capsule Take 1 g by mouth 2 (two) times daily.      . Flaxseed, Linseed, (FLAX SEED OIL) 1000 MG CAPS Take 1,000 mg by mouth 2 (two) times daily.       . Magnesium 250 MG TABS Take 250 mg by mouth daily.       . meloxicam (MOBIC) 15 MG tablet Take 15 mg by mouth daily.       . metoprolol tartrate (LOPRESSOR) 25 MG tablet Take 12.5 mg by mouth 2 (two) times daily.       . Multiple Vitamins-Minerals (CENTRUM) tablet Take 1 tablet by mouth daily.       . rosuvastatin (CRESTOR) 5 MG tablet Take 5 mg by mouth at bedtime.       . vitamin C (ASCORBIC ACID) 500 MG tablet Take 500 mg by mouth daily.        No current facility-administered medications on file prior to visit.     Allergies  Allergen Reactions  . Hctz [Hydrochlorothiazide]   . Sulfa Antibiotics   . Tetracyclines & Related     PMHx:   Past Medical History  Diagnosis Date  . Hyperlipidemia   . Gout   . History of small bowel obstruction   . COPD (chronic obstructive pulmonary disease)   . Substance abuse     Tobacco  . Depression   . Pulmonary hypertension   . Umbilical hernia   . Bronchitis   . Atrial fibrillation   . High cholesterol   . Pneumonia   . Arthritis   . Abdominal aortic aneurysm     followed by Dr Kellie Simmering-  4.8 cm per [place] 6/12- [facility]  . Complication of anesthesia     slow to wake up per pt- anesthesia record on chart  . Hypertension   . Anxiety     FHx:    Reviewed / unchanged  SHx:    Reviewed / unchanged  Systems Review: Constitutional: Denies fever, chills, wt changes, headaches, insomnia, fatigue, night sweats, change in appetite. Eyes: Denies redness, blurred vision, diplopia, discharge, itchy, watery eyes.  ENT: Denies discharge, congestion, post nasal drip, epistaxis, sore throat, earache, hearing loss, dental pain, tinnitus, vertigo, sinus pain, snoring.  CV: Denies chest pain, palpitations, irregular heartbeat, syncope, dyspnea, diaphoresis, orthopnea, PND, claudication, edema. Respiratory: denies cough, dyspnea, DOE, pleurisy, hoarseness, laryngitis, wheezing.  Gastrointestinal: Denies dysphagia, odynophagia, heartburn, reflux, water brash, abdominal pain or cramps, nausea, vomiting, bloating,  diarrhea, constipation, hematemesis, melena, hematochezia,  or hemorrhoids. Genitourinary: Denies dysuria, frequency, urgency, nocturia, hesitancy, discharge, hematuria, flank pain. Musculoskeletal: Denies arthralgias, myalgias, stiffness, jt. swelling, pain, limp, strain/sprain.  Skin: Denies pruritus, rash, hives, warts, acne, eczema, change in skin lesion(s). Neuro: No weakness, tremor, incoordination, spasms, paresthesia, or pain. Psychiatric: Denies confusion, memory loss, or sensory loss. Endo: Denies change in weight, skin, hair change.  Heme/Lymph: No excessive bleeding, bruising, orenlarged lymph nodes.  Filed Vitals:   06/26/13 1709  BP: 128/66  Pulse: 64  Temp: 97.3 F (36.3 C)  Resp: 18    Estimated body mass index is 36.98 kg/(m^2) as calculated from the following:   Height as of 12/31/12: _0  (1.676 m).   Weight as of this encounter: 229 lb (103.874 kg).  On Exam: Appears over fed and in no distress. Plethoric facies. Eyes: PERRLA, EOMs, conjunctiva no swelling or erythema. Sinuses: No frontal/maxillary tenderness ENT/Mouth: EAC's clear, TM's nl w/o erythema, bulging. Nares clear w/o erythema, swelling, exudates. Oropharynx clear without erythema or exudates. Oral hygiene is good. Tongue normal, non obstructing. Hearing intact.  Neck: Supple. Thyroid nl. Car 2+/2+ without bruits, nodes or JVD. Chest: Barrel chested, mild kyphosis and increased AP diameter.Respirationsdecreased with BS distant with Bilat scattered rales , few  rhonchi, but no wheezing or stridor. O2 sat 96% at rest. Cor: Heart sounds distant w/ regular rate and rhythm without sig. murmurs, gallops, clicks, or rubs. Peripheral pulses normal and equal  With 1 + ankle edema.  Abdomen: Soft & bowel sounds normal. Non-tender w/o guarding, rebound, hernias, masses, or organomegaly.  Lymphatics: Unremarkable.  Musculoskeletal: Full ROM all peripheral extremities, joint stability, 5/5 strength, and normal gait.   Skin: Warm, dry without exposed rashes, lesions, ecchymosis apparent.  Neuro: Cranial nerves intact, reflexes equal bilaterally. Sensory-motor testing grossly intact. Tendon reflexes grossly intact.  Pysch: Alert & oriented x 3. Insight and judgement nl & appropriate. No ideations.  Assessment and Plan:  1. Hypertension - Continue monitor blood pressure at home. Continue diet/meds same.  2. Hyperlipidemia - Continue diet/meds, exercise,& lifestyle modifications. Continue monitor periodic cholesterol/liver & renal functions   3. Pre-diabetes/Insulin Resistance - Continue diet, exercise, lifestyle modifications. Monitor appropriate labs.  3. Diabetes - continue recommend prudent low glycemic diet, weight control, regular exercise, diabetic monitoring and periodic eye exams.  4. Vitamin D Deficiency - Continue supplementation.  5. COPD - discouraged smoking and counseled in cessation.  Recommended regular exercise, BP monitoring, weight control, and discussed med and SE's. Recommended labs to assess and monitor clinical status. Further disposition pending results of labs.

## 2013-06-27 LAB — BASIC METABOLIC PANEL WITH GFR
BUN: 27 mg/dL — ABNORMAL HIGH (ref 6–23)
GFR, Est African American: 44 mL/min — ABNORMAL LOW
GFR, Est Non African American: 38 mL/min — ABNORMAL LOW
Potassium: 4.6 mEq/L (ref 3.5–5.3)
Sodium: 137 mEq/L (ref 135–145)

## 2013-06-27 LAB — CBC WITH DIFFERENTIAL/PLATELET
Basophils Absolute: 0 10*3/uL (ref 0.0–0.1)
Basophils Relative: 0 % (ref 0–1)
MCHC: 34.9 g/dL (ref 30.0–36.0)
Monocytes Absolute: 0.6 10*3/uL (ref 0.1–1.0)
Neutro Abs: 3.7 10*3/uL (ref 1.7–7.7)
Neutrophils Relative %: 55 % (ref 43–77)
Platelets: 235 10*3/uL (ref 150–400)
RDW: 15.3 % (ref 11.5–15.5)

## 2013-06-27 LAB — INSULIN, FASTING: Insulin fasting, serum: 66 u[IU]/mL — ABNORMAL HIGH (ref 3–28)

## 2013-06-27 LAB — HEPATIC FUNCTION PANEL
Albumin: 4.1 g/dL (ref 3.5–5.2)
Indirect Bilirubin: 0.3 mg/dL (ref 0.0–0.9)
Total Protein: 6.6 g/dL (ref 6.0–8.3)

## 2013-06-27 LAB — LIPID PANEL
Cholesterol: 152 mg/dL (ref 0–200)
Triglycerides: 287 mg/dL — ABNORMAL HIGH (ref <150)
VLDL: 57 mg/dL — ABNORMAL HIGH (ref 0–40)

## 2013-06-27 LAB — HEMOGLOBIN A1C: Mean Plasma Glucose: 137 mg/dL — ABNORMAL HIGH (ref <117)

## 2013-06-27 LAB — VITAMIN D 25 HYDROXY (VIT D DEFICIENCY, FRACTURES): Vit D, 25-Hydroxy: 66 ng/mL (ref 30–89)

## 2013-07-08 ENCOUNTER — Ambulatory Visit: Payer: Medicare Other | Admitting: Vascular Surgery

## 2013-08-04 ENCOUNTER — Other Ambulatory Visit: Payer: Self-pay | Admitting: Internal Medicine

## 2013-08-18 ENCOUNTER — Encounter: Payer: Self-pay | Admitting: Internal Medicine

## 2013-08-27 ENCOUNTER — Other Ambulatory Visit: Payer: Self-pay | Admitting: Internal Medicine

## 2013-09-29 ENCOUNTER — Other Ambulatory Visit: Payer: Self-pay | Admitting: Internal Medicine

## 2013-10-02 ENCOUNTER — Encounter: Payer: Self-pay | Admitting: Internal Medicine

## 2013-10-02 ENCOUNTER — Ambulatory Visit (INDEPENDENT_AMBULATORY_CARE_PROVIDER_SITE_OTHER): Payer: Medicare Other | Admitting: Internal Medicine

## 2013-10-02 VITALS — BP 124/76 | HR 80 | Temp 97.7°F | Resp 16 | Ht 65.5 in | Wt 236.6 lb

## 2013-10-02 DIAGNOSIS — I10 Essential (primary) hypertension: Secondary | ICD-10-CM

## 2013-10-02 DIAGNOSIS — E785 Hyperlipidemia, unspecified: Secondary | ICD-10-CM

## 2013-10-02 DIAGNOSIS — E669 Obesity, unspecified: Secondary | ICD-10-CM | POA: Insufficient documentation

## 2013-10-02 DIAGNOSIS — E1129 Type 2 diabetes mellitus with other diabetic kidney complication: Secondary | ICD-10-CM | POA: Insufficient documentation

## 2013-10-02 DIAGNOSIS — J449 Chronic obstructive pulmonary disease, unspecified: Secondary | ICD-10-CM

## 2013-10-02 DIAGNOSIS — R7309 Other abnormal glucose: Secondary | ICD-10-CM

## 2013-10-02 DIAGNOSIS — E559 Vitamin D deficiency, unspecified: Secondary | ICD-10-CM

## 2013-10-02 DIAGNOSIS — Z79899 Other long term (current) drug therapy: Secondary | ICD-10-CM

## 2013-10-02 LAB — CBC WITH DIFFERENTIAL/PLATELET
Basophils Absolute: 0.1 10*3/uL (ref 0.0–0.1)
Basophils Relative: 1 % (ref 0–1)
Eosinophils Absolute: 0.3 10*3/uL (ref 0.0–0.7)
Eosinophils Relative: 3 % (ref 0–5)
HEMATOCRIT: 44.4 % (ref 36.0–46.0)
Hemoglobin: 15.1 g/dL — ABNORMAL HIGH (ref 12.0–15.0)
LYMPHS ABS: 2.5 10*3/uL (ref 0.7–4.0)
LYMPHS PCT: 30 % (ref 12–46)
MCH: 32.4 pg (ref 26.0–34.0)
MCHC: 34 g/dL (ref 30.0–36.0)
MCV: 95.3 fL (ref 78.0–100.0)
MONO ABS: 0.6 10*3/uL (ref 0.1–1.0)
MONOS PCT: 7 % (ref 3–12)
NEUTROS ABS: 5 10*3/uL (ref 1.7–7.7)
Neutrophils Relative %: 59 % (ref 43–77)
Platelets: 254 10*3/uL (ref 150–400)
RBC: 4.66 MIL/uL (ref 3.87–5.11)
RDW: 14.8 % (ref 11.5–15.5)
WBC: 8.4 10*3/uL (ref 4.0–10.5)

## 2013-10-02 MED ORDER — PREDNISONE 20 MG PO TABS: 20.0000 mg | ORAL_TABLET | ORAL | Status: DC

## 2013-10-02 MED ORDER — CEPHALEXIN 500 MG PO CAPS: 500.0000 mg | ORAL_CAPSULE | Freq: Four times a day (QID) | ORAL | Status: AC

## 2013-10-02 NOTE — Progress Notes (Signed)
Patient ID: Heather Stevenson, female   DOB: 1930/02/23, 80 y.o.   MRN: [id number]    This very nice 80 y.o. [facility] presents for 3 month follow up with Hypertension, Hyperlipidemia, COPD, Pre-Diabetes and Vitamin D Deficiency.    HTN predates since   . BP has been controlled at home. Today's BP: 124/76 mmHg . Patient denies any cardiac type chest pain, palpitations, dyspnea/orthopnea/PND, dizziness, claudication, or dependent edema.   Hyperlipidemia is controlled with diet & meds. Last Cholesterol was 152, Triglycerides were 287, HDL 35 and LDL 60 in Dec - at goal. Patient denies myalgias or other med SE's.    Also, the patient has history of PreDiabetes with A1c 6.1% in 2010 and with last A1c of 6.4% with elevated insulin 71 in Dec 2014. Patient denies any symptoms of reactive hypoglycemia, diabetic polys, paresthesias or visual blurring.   Other pertinent problems include COPD with c/o's of cough occasionally productive of thick creamy sputum. She continues to smoke. She admits exertional dyspnea at short distances. She denies any hemoptysis. She has stated in the past if she had a lung cancer that she would decline any treatments for it.   Further, Patient has history of Vitamin D Deficiency with last vitamin D of   . Patient supplements vitamin D without any suspected side-effects.  Medication Sig  . allopurinol (ZYLOPRIM) 300 MG tablet TAKE 1 TABLET ONCE DAILY. FOR GOUT  . aspirin EC 325 MG tablet Take 325 mg by mouth at bedtime.  . Cholecalciferol ([employer]S VIT D 5000 HIGH-POTENCY PO) Take 5,000 Units by mouth daily.   . citalopram (CELEXA) 20 MG tablet TAKE 1 TABLET ONCE DAILY.  Marland Kitchen Cyanocobalamin (VITAMIN B 12 PO) Take 500 mcg by mouth daily.   Marland Kitchen dexlansoprazole (DEXILANT) 60 MG capsule Take 60 mg by mouth daily.  . fenofibrate micronized (LOFIBRA) 134 MG capsule TAKE 1 CAPSULE ONCE DAILY FOR BLOOD FATS.  . fish oil-omega-3 fatty acids 1000 MG capsule Take 1 g by mouth 2 (two) times daily.  .  Flaxseed, Linseed, (FLAX SEED OIL) 1000 MG CAPS Take 1,000 mg by mouth 2 (two) times daily.   . Magnesium 250 MG TABS Take 250 mg by mouth daily.  . meloxicam (MOBIC) 15 MG tablet Take 15 mg by mouth daily.   . metoprolol tartrate (LOPRESSOR) 25 MG tablet TAKE (1/2) TABLET TWICE DAILY FOR BLOOD PRESSURE  . Multiple Vitamins-Minerals (CENTRUM) tablet Take 1 tablet by mouth daily.   Marland Kitchen PROAIR HFA 108 (90 BASE) MCG/ACT inhaler PRN  . rosuvastatin (CRESTOR) 5 MG tablet Take 5 mg by mouth at bedtime.   . vitamin C (ASCORBIC ACID) 500 MG tablet Take 500 mg by mouth daily.       Allergies  Allergen Reactions  . Hctz [Hydrochlorothiazide]   . Sulfa Antibiotics   . Tetracyclines & Related     PMHx:   Past Medical History  Diagnosis Date  . Hyperlipidemia   . Gout   . History of small bowel obstruction   . COPD (chronic obstructive pulmonary disease)   . Substance abuse     Tobacco  . Depression   . Pulmonary hypertension   . Umbilical hernia   . Bronchitis   . Atrial fibrillation   . High cholesterol   . Pneumonia   . Arthritis   . Abdominal aortic aneurysm     followed by Dr Kellie Simmering- 4.8 cm per [place] 6/12- [facility]  . Complication of anesthesia     slow to wake  up per pt- anesthesia record on chart  . Hypertension   . Anxiety   . Atherosclerosis of aorta     FHx:    Reviewed / unchanged  SHx:    Reviewed / unchanged   Systems Review: Constitutional: Denies fever, chills, wt changes, headaches, insomnia, fatigue, night sweats, change in appetite. Eyes: Denies redness, blurred vision, diplopia, discharge, itchy, watery eyes.  ENT: Denies discharge, congestion, post nasal drip, epistaxis, sore throat, earache, hearing loss, dental pain, tinnitus, vertigo, sinus pain, snoring.  CV: Denies chest pain, palpitations, irregular heartbeat, syncope, dyspnea, diaphoresis, orthopnea, PND, claudication, edema. Respiratory: denies cough, dyspnea, DOE, pleurisy, hoarseness, laryngitis, wheezing.   Gastrointestinal: Denies dysphagia, odynophagia, heartburn, reflux, water brash, abdominal pain or cramps, nausea, vomiting, bloating, diarrhea, constipation, hematemesis, melena, hematochezia,  or hemorrhoids. Genitourinary: Denies dysuria, frequency, urgency, nocturia, hesitancy, discharge, hematuria, flank pain. Musculoskeletal: Denies arthralgias, myalgias, stiffness, jt. swelling, pain, limp, strain/sprain.  Skin: Denies pruritus, rash, hives, warts, acne, eczema, change in skin lesion(s). Neuro: No weakness, tremor, incoordination, spasms, paresthesia, or pain. Psychiatric: Denies confusion, memory loss, or sensory loss. Endo: Denies change in weight, skin, hair change.  Heme/Lymph: No excessive bleeding, bruising, orenlarged lymph nodes.   Exam:  BP 124/76  Pulse 80  Temp 97.7 F Resp 16  Ht 5' 5.5"  Wt 236 lb 9.6 oz   BMI 38.76 kg/m2  SpO2 95%  Appears well nourished - in no distress. Eyes: PERRLA, EOMs, conjunctiva no swelling or erythema. Sinuses: No frontal/maxillary tenderness ENT/Mouth: EAC's clear, TM's nl w/o erythema, bulging. Nares clear w/o erythema, swelling, exudates. Oropharynx clear without erythema or exudates. Oral hygiene is good. Tongue normal, non obstructing. Hearing intact.  Neck: Supple. Thyroid nl. Car 2+/2+ without bruits, nodes or JVD. Chest: Respirations nl with BS clear & equal w/o rales, rhonchi, wheezing or stridor.  Cor: Heart sounds normal w/ regular rate and rhythm without sig. murmurs, gallops, clicks, or rubs. Peripheral pulses normal and equal  without edema.  Abdomen: Soft & bowel sounds normal. Non-tender w/o guarding, rebound, hernias, masses, or organomegaly.  Lymphatics: Unremarkable.  Musculoskeletal: Full ROM all peripheral extremities, joint stability, 5/5 strength, and normal gait.  Skin: Warm, dry without exposed rashes, lesions, ecchymosis apparent.  Neuro: Cranial nerves intact, reflexes equal bilaterally. Sensory-motor testing  grossly intact. Tendon reflexes grossly intact.  Pysch: Alert & oriented x 3. Insight and judgement nl & appropriate. No ideations.  Assessment and Plan:  1. Hypertension - Continue monitor blood pressure at home. Continue diet/meds same.  2. Hyperlipidemia - Continue diet/meds, exercise,& lifestyle modifications. Continue monitor periodic cholesterol/liver & renal functions   3. Pre-diabetes/Insulin Resistance - Continue diet, exercise, lifestyle modifications. Monitor appropriate labs.  4. Vitamin D Deficiency - Continue supplementation pending labs.  5. COPD/AECB - no compliance with recommended smoking cessation   Recommended regular exercise, BP monitoring, weight control, and discussed med and SE's. Recommended labs to assess and monitor clinical status. Further disposition pending results of labs. Recommended smoking cessatio and discussed risks.  Will compliment Spiriva qHs with Sx of Breo Elliptia sx for 28 days and given Rx Keflex 500 mg  # 56 x 1 Rf - qid and Prednisone 20 mg #20 pulse/taper Instructed & demonstrated to patient the  proper use of elliptia inhaler.  Recommended F/U OV in 1 mo and due CPE in about 3 mo.

## 2013-10-02 NOTE — Patient Instructions (Addendum)
Bronchitis Bronchitis is inflammation of the airways that extend from the windpipe into the lungs (bronchi). The inflammation often causes mucus to develop, which leads to a cough. If the inflammation becomes severe, it may cause shortness of breath. CAUSES  Bronchitis may be caused by:   Viral infections.   Bacteria.   Cigarette smoke.   Allergens, pollutants, and other irritants.  SIGNS AND SYMPTOMS  The most common symptom of bronchitis is a frequent cough that produces mucus. Other symptoms include:  Fever.   Body aches.   Chest congestion.   Chills.   Shortness of breath.   Sore throat.  DIAGNOSIS  Bronchitis is usually diagnosed through a medical history and physical exam. Tests, such as chest X-rays, are sometimes done to rule out other conditions.  TREATMENT  You may need to avoid contact with whatever caused the problem (smoking, for example). Medicines are sometimes needed. These may include:  Antibiotics. These may be prescribed if the condition is caused by bacteria.  Cough suppressants. These may be prescribed for relief of cough symptoms.   Inhaled medicines. These may be prescribed to help open your airways and make it easier for you to breathe.   Steroid medicines. These may be prescribed for those with recurrent (chronic) bronchitis. HOME CARE INSTRUCTIONS  Get plenty of rest.   Drink enough fluids to keep your urine clear or pale yellow (unless you have a medical condition that requires fluid restriction). Increasing fluids may help thin your secretions and will prevent dehydration.   Only take over-the-counter or prescription medicines as directed by your health care provider.  Only take antibiotics as directed. Make sure you finish them even if you start to feel better.  Avoid secondhand smoke, irritating chemicals, and strong fumes. These will make bronchitis worse. If you are a smoker, quit smoking. Consider using nicotine gum or  skin patches to help control withdrawal symptoms. Quitting smoking will help your lungs heal faster.   Put a cool-mist humidifier in your bedroom at night to moisten the air. This may help loosen mucus. Change the water in the humidifier daily. You can also run the hot water in your shower and sit in the bathroom with the door closed for 5 10 minutes.   Follow up with your health care provider as directed.   Wash your hands frequently to avoid catching bronchitis again or spreading an infection to others.  SEEK MEDICAL CARE IF: Your symptoms do not improve after 1 week of treatment.  SEEK IMMEDIATE MEDICAL CARE IF:  Your fever increases.  You have chills.   You have chest pain.   You have worsening shortness of breath.   You have bloody sputum.  You faint.  You have lightheadedness.  You have a severe headache.   You vomit repeatedly. MAKE SURE YOU:   Understand these instructions.  Will watch your condition.  Will get help right away if you are not doing well or get worse. Document Released: 06/26/2005 Document Revised: 04/16/2013 Document Reviewed: 02/18/2013 [facility] Patient Information 2014 [place].  Chronic Obstructive Pulmonary Disease Chronic obstructive pulmonary disease (COPD) is a common lung condition in which airflow from the lungs is limited. COPD is a general term that can be used to describe many different lung problems that limit airflow, including both chronic bronchitis and emphysema. If you have COPD, your lung function will probably never return to normal, but there are measures you can take to improve lung function and make yourself feel better.  CAUSES   Smoking (common).   Exposure to secondhand smoke.   Genetic problems.  Chronic inflammatory lung diseases or recurrent infections. SYMPTOMS   Shortness of breath, especially with physical activity.   Deep, persistent (chronic) cough with a large amount of thick mucus.    Wheezing.   Rapid breaths (tachypnea).   Gray or bluish discoloration (cyanosis) of the skin, especially in fingers, toes, or lips.   Fatigue.   Weight loss.   Frequent infections or episodes when breathing symptoms become much worse (exacerbations).   Chest tightness. DIAGNOSIS  Your healthcare provider will take a medical history and perform a physical examination to make the initial diagnosis. Additional tests for COPD may include:   Lung (pulmonary) function tests.  Chest X-ray.  CT scan.  Blood tests. TREATMENT  Treatment available to help you feel better when you have COPD include:   Inhaler and nebulizer medicines. These help manage the symptoms of COPD and make your breathing more comfortable  Supplemental oxygen. Supplemental oxygen is only helpful if you have a low oxygen level in your blood.   Exercise and physical activity. These are beneficial for nearly all people with COPD. Some people may also benefit from a pulmonary rehabilitation program. HOME CARE INSTRUCTIONS   Take all medicines (inhaled or pills) as directed by your health care provider.  Only take over-the-counter or prescription medicines for pain, fever, or discomfort as directed by your health care provider.   Avoid over-the-counter medicines or cough syrups that dry up your airway (such as antihistamines) and slow down the elimination of secretions unless instructed otherwise by your healthcare provider.   If you are a smoker, the most important thing that you can do is stop smoking. Continuing to smoke will cause further lung damage and breathing trouble. Ask your health care provider for help with quitting smoking. He or she can direct you to community resources or hospitals that provide support.  Avoid exposure to irritants such as smoke, chemicals, and fumes that aggravate your breathing.  Use oxygen therapy and pulmonary rehabilitation if directed by your health care provider.  If you require home oxygen therapy, ask your healthcare provider whether you should purchase a pulse oximeter to measure your oxygen level at home.   Avoid contact with individuals who have a contagious illness.  Avoid extreme temperature and humidity changes.  Eat healthy foods. Eating smaller, more frequent meals and resting before meals may help you maintain your strength.  Stay active, but balance activity with periods of rest. Exercise and physical activity will help you maintain your ability to do things you want to do.  Preventing infection and hospitalization is very important when you have COPD. Make sure to receive all the vaccines your health care provider recommends, especially the pneumococcal and influenza vaccines. Ask your healthcare provider whether you need a pneumonia vaccine.  Learn and use relaxation techniques to manage stress.  Learn and use controlled breathing techniques as directed by your health care provider. Controlled breathing techniques include:   Pursed lip breathing. Start by breathing in (inhaling) through your nose for 1 second. Then, purse your lips as if you were going to whistle and breathe out (exhale) through the pursed lips for 2 seconds.   Diaphragmatic breathing. Start by putting one hand on your abdomen just above your waist. Inhale slowly through your nose. The hand on your abdomen should move out. Then purse your lips and exhale slowly. You should be able to feel the hand on  your abdomen moving in as you exhale.   Learn and use controlled coughing to clear mucus from your lungs. Controlled coughing is a series of short, progressive coughs. The steps of controlled coughing are:   Lean your head slightly forward.   Breathe in deeply using diaphragmatic breathing.   Try to hold your breath for 3 seconds.   Keep your mouth slightly open while coughing twice.   Spit any mucus out into a tissue.   Rest and repeat the steps once or  twice as needed. SEEK MEDICAL CARE IF:   You are coughing up more mucus than usual.   There is a change in the color or thickness of your mucus.   Your breathing is more labored than usual.   Your breathing is faster than usual.  SEEK IMMEDIATE MEDICAL CARE IF:   You have shortness of breath while you are resting.   You have shortness of breath that prevents you from:  Being able to talk.   Performing your usual physical activities.   You have chest pain lasting longer than 5 minutes.   Your skin color is more cyanotic than usual.  You measure low oxygen saturations for longer than 5 minutes with a pulse oximeter. MAKE SURE YOU:   Understand these instructions.  Will watch your condition.  Will get help right away if you are not doing well or get worse. Document Released: 04/05/2005 Document Revised: 04/16/2013 Document Reviewed: 02/20/2013 [facility] Patient Information 2014 [place], [place].    Hypertension As your heart beats, it forces blood through your arteries. This force is your blood pressure. If the pressure is too high, it is called hypertension (HTN) or high blood pressure. HTN is dangerous because you may have it and not know it. High blood pressure may mean that your heart has to work harder to pump blood. Your arteries may be narrow or stiff. The extra work puts you at risk for heart disease, stroke, and other problems.  Blood pressure consists of two numbers, a higher number over a lower, 110/72, for example. It is stated as "110 over 72." The ideal is below 120 for the top number (systolic) and under 80 for the bottom (diastolic). Write down your blood pressure today. You should pay close attention to your blood pressure if you have certain conditions such as:  Heart failure.  Prior heart attack.  Diabetes  Chronic kidney disease.  Prior stroke.  Multiple risk factors for heart disease. To see if you have HTN, your blood pressure should be  measured while you are seated with your arm held at the level of the heart. It should be measured at least twice. A one-time elevated blood pressure reading (especially in the Emergency Department) does not mean that you need treatment. There may be conditions in which the blood pressure is different between your right and left arms. It is important to see your caregiver soon for a recheck. Most people have essential hypertension which means that there is not a specific cause. This type of high blood pressure may be lowered by changing lifestyle factors such as:  Stress.  Smoking.  Lack of exercise.  Excessive weight.  Drug/tobacco/alcohol use.  Eating less salt. Most people do not have symptoms from high blood pressure until it has caused damage to the body. Effective treatment can often prevent, delay or reduce that damage. TREATMENT  When a cause has been identified, treatment for high blood pressure is directed at the cause. There are a  large number of medications to treat HTN. These fall into several categories, and your caregiver will help you select the medicines that are best for you. Medications may have side effects. You should review side effects with your caregiver. If your blood pressure stays high after you have made lifestyle changes or started on medicines,   Your medication(s) may need to be changed.  Other problems may need to be addressed.  Be certain you understand your prescriptions, and know how and when to take your medicine.  Be sure to follow up with your caregiver within the time frame advised (usually within two weeks) to have your blood pressure rechecked and to review your medications.  If you are taking more than one medicine to lower your blood pressure, make sure you know how and at what times they should be taken. Taking two medicines at the same time can result in blood pressure that is too low. SEEK IMMEDIATE MEDICAL CARE IF:  You develop a severe  headache, blurred or changing vision, or confusion.  You have unusual weakness or numbness, or a faint feeling.  You have severe chest or abdominal pain, vomiting, or breathing problems. MAKE SURE YOU:   Understand these instructions.  Will watch your condition.  Will get help right away if you are not doing well or get worse.   Diabetes and Exercise Exercising regularly is important. It is not just about losing weight. It has many health benefits, such as:  Improving your overall fitness, flexibility, and endurance.  Increasing your bone density.  Helping with weight control.  Decreasing your body fat.  Increasing your muscle strength.  Reducing stress and tension.  Improving your overall health. People with diabetes who exercise gain additional benefits because exercise:  Reduces appetite.  Improves the body's use of blood sugar (glucose).  Helps lower or control blood glucose.  Decreases blood pressure.  Helps control blood lipids (such as cholesterol and triglycerides).  Improves the body's use of the hormone insulin by:  Increasing the body's insulin sensitivity.  Reducing the body's insulin needs.  Decreases the risk for heart disease because exercising:  Lowers cholesterol and triglycerides levels.  Increases the levels of good cholesterol (such as high-density lipoproteins [HDL]) in the body.  Lowers blood glucose levels. YOUR ACTIVITY PLAN  Choose an activity that you enjoy and set realistic goals. Your health care provider or diabetes educator can help you make an activity plan that works for you. You can break activities into 2 or 3 sessions throughout the day. Doing so is as good as one long session. Exercise ideas include:  Taking the dog for a walk.  Taking the stairs instead of the elevator.  Dancing to your favorite song.  Doing your favorite exercise with a friend. RECOMMENDATIONS FOR EXERCISING WITH TYPE 1 OR TYPE 2 DIABETES   Check  your blood glucose before exercising. If blood glucose levels are greater than 240 mg/dL, check for urine ketones. Do not exercise if ketones are present.  Avoid injecting insulin into areas of the body that are going to be exercised. For example, avoid injecting insulin into:  The arms when playing tennis.  The legs when jogging.  Keep a record of:  Food intake before and after you exercise.  Expected peak times of insulin action.  Blood glucose levels before and after you exercise.  The type and amount of exercise you have done.  Review your records with your health care provider. Your health care provider will help  you to develop guidelines for adjusting food intake and insulin amounts before and after exercising.  If you take insulin or oral hypoglycemic agents, watch for signs and symptoms of hypoglycemia. They include:  Dizziness.  Shaking.  Sweating.  Chills.  Confusion.  Drink plenty of water while you exercise to prevent dehydration or heat stroke. Body water is lost during exercise and must be replaced.  Talk to your health care provider before starting an exercise program to make sure it is safe for you. Remember, almost any type of activity is better than none.    Cholesterol Cholesterol is a white, waxy, fat-like protein needed by your body in small amounts. The liver makes all the cholesterol you need. It is carried from the liver by the blood through the blood vessels. Deposits (plaque) may build up on blood vessel walls. This makes the arteries narrower and stiffer. Plaque increases the risk for heart attack and stroke. You cannot feel your cholesterol level even if it is very high. The only way to know is by a blood test to check your lipid (fats) levels. Once you know your cholesterol levels, you should keep a record of the test results. Work with your caregiver to to keep your levels in the desired range. WHAT THE RESULTS MEAN:  Total cholesterol is a  rough measure of all the cholesterol in your blood.  LDL is the so-called bad cholesterol. This is the type that deposits cholesterol in the walls of the arteries. You want this level to be low.  HDL is the good cholesterol because it cleans the arteries and carries the LDL away. You want this level to be high.  Triglycerides are fat that the body can either burn for energy or store. High levels are closely linked to heart disease. DESIRED LEVELS:  Total cholesterol below 200.  LDL below 100 for people at risk, below 70 for very high risk.  HDL above 50 is good, above 60 is best.  Triglycerides below 150. HOW TO LOWER YOUR CHOLESTEROL:  Diet.  Choose fish or white meat chicken and [place], roasted or baked. Limit fatty cuts of red meat, fried foods, and processed meats, such as sausage and lunch meat.  Eat lots of fresh fruits and vegetables. Choose whole grains, beans, pasta, potatoes and cereals.  Use only small amounts of olive, corn or canola oils. Avoid butter, mayonnaise, shortening or palm kernel oils. Avoid foods with trans-fats.  Use skim/nonfat milk and low-fat/nonfat yogurt and cheeses. Avoid whole milk, cream, ice cream, egg yolks and cheeses. Healthy desserts include angel food cake, ginger snaps, animal crackers, hard candy, popsicles, and low-fat/nonfat frozen yogurt. Avoid pastries, cakes, pies and cookies.  Exercise.  A regular program helps decrease LDL and raises HDL.  Helps with weight control.  Do things that increase your activity level like gardening, walking, or taking the stairs.  Medication.  May be prescribed by your caregiver to help lowering cholesterol and the risk for heart disease.  You may need medicine even if your levels are normal if you have several risk factors. HOME CARE INSTRUCTIONS   Follow your diet and exercise programs as suggested by your caregiver.  Take medications as directed.  Have blood work done when your caregiver feels  it is necessary. MAKE SURE YOU:   Understand these instructions.  Will watch your condition.  Will get help right away if you are not doing well or get worse.      Vitamin D Deficiency Vitamin D  is an important vitamin that your body needs. Having too little of it in your body is called a deficiency. A very bad deficiency can make your bones soft and can cause a condition called rickets.  Vitamin D is important to your body for different reasons, such as:   It helps your body absorb 2 minerals called calcium and phosphorus.  It helps make your bones healthy.  It may prevent some diseases, such as diabetes and multiple sclerosis.  It helps your muscles and heart. You can get vitamin D in several ways. It is a natural part of some foods. The vitamin is also added to some dairy products and cereals. Some people take vitamin D supplements. Also, your body makes vitamin D when you are in the sun. It changes the sun's rays into a form of the vitamin that your body can use. CAUSES   Not eating enough foods that contain vitamin D.  Not getting enough sunlight.  Having certain digestive system diseases that make it hard to absorb vitamin D. These diseases include Crohn's disease, chronic pancreatitis, and cystic fibrosis.  Having a surgery in which part of the stomach or small intestine is removed.  Being obese. Fat cells pull vitamin D out of your blood. That means that obese people may not have enough vitamin D left in their blood and in other body tissues.  Having chronic kidney or liver disease. RISK FACTORS Risk factors are things that make you more likely to develop a vitamin D deficiency. They include:  Being older.  Not being able to get outside very much.  Living in a nursing home.  Having had broken bones.  Having weak or thin bones (osteoporosis).  Having a disease or condition that changes how your body absorbs vitamin D.  Having dark skin.  Some medicines  such as seizure medicines or steroids.  Being overweight or obese. SYMPTOMS Mild cases of vitamin D deficiency may not have any symptoms. If you have a very bad case, symptoms may include:  Bone pain.  Muscle pain.  Falling often.  Broken bones caused by a minor injury, due to osteoporosis. DIAGNOSIS A blood test is the best way to tell if you have a vitamin D deficiency. TREATMENT Vitamin D deficiency can be treated in different ways. Treatment for vitamin D deficiency depends on what is causing it. Options include:  Taking vitamin D supplements.  Taking a calcium supplement. Your caregiver will suggest what dose is best for you. HOME CARE INSTRUCTIONS  Take any supplements that your caregiver prescribes. Follow the directions carefully. Take only the suggested amount.  Have your blood tested 2 months after you start taking supplements.  Eat foods that contain vitamin D. Healthy choices include:  Fortified dairy products, cereals, or juices. Fortified means vitamin D has been added to the food. Check the label on the package to be sure.  Fatty fish like salmon or trout.  Eggs.  Oysters.  Do not use a tanning bed.  Keep your weight at a healthy level. Lose weight if you need to.  Keep all follow-up appointments. Your caregiver will need to perform blood tests to make sure your vitamin D deficiency is going away. SEEK MEDICAL CARE IF:  You have any questions about your treatment.  You continue to have symptoms of vitamin D deficiency.  You have nausea or vomiting.  You are constipated.  You feel confused.  You have severe abdominal or back pain. MAKE SURE YOU:  Understand these  instructions.  Will watch your condition.  Will get help right away if you are not doing well or get worse.

## 2013-10-03 LAB — HEPATIC FUNCTION PANEL
ALK PHOS: 60 U/L (ref 39–117)
ALT: 18 U/L (ref 0–35)
AST: 19 U/L (ref 0–37)
Albumin: 3.8 g/dL (ref 3.5–5.2)
BILIRUBIN DIRECT: 0.1 mg/dL (ref 0.0–0.3)
BILIRUBIN INDIRECT: 0.4 mg/dL (ref 0.2–1.2)
Total Bilirubin: 0.5 mg/dL (ref 0.2–1.2)
Total Protein: 6.3 g/dL (ref 6.0–8.3)

## 2013-10-03 LAB — BASIC METABOLIC PANEL WITH GFR
BUN: 27 mg/dL — ABNORMAL HIGH (ref 6–23)
CHLORIDE: 101 meq/L (ref 96–112)
CO2: 27 mEq/L (ref 19–32)
Calcium: 9.3 mg/dL (ref 8.4–10.5)
Creat: 1.3 mg/dL — ABNORMAL HIGH (ref 0.50–1.10)
GFR, Est African American: 44 mL/min — ABNORMAL LOW
GFR, Est Non African American: 38 mL/min — ABNORMAL LOW
GLUCOSE: 84 mg/dL (ref 70–99)
POTASSIUM: 4.7 meq/L (ref 3.5–5.3)
SODIUM: 137 meq/L (ref 135–145)

## 2013-10-03 LAB — LIPID PANEL
CHOL/HDL RATIO: 3.6 ratio
Cholesterol: 130 mg/dL (ref 0–200)
HDL: 36 mg/dL — ABNORMAL LOW (ref >39)
LDL CALC: 57 mg/dL (ref 0–99)
TRIGLYCERIDES: 186 mg/dL — AB (ref <150)
VLDL: 37 mg/dL (ref 0–40)

## 2013-10-03 LAB — VITAMIN D 25 HYDROXY (VIT D DEFICIENCY, FRACTURES): Vit D, 25-Hydroxy: 68 ng/mL (ref 30–89)

## 2013-10-03 LAB — HEMOGLOBIN A1C
Hgb A1c MFr Bld: 6.7 % — ABNORMAL HIGH (ref <5.7)
MEAN PLASMA GLUCOSE: 146 mg/dL — AB (ref <117)

## 2013-10-03 LAB — INSULIN, FASTING: Insulin fasting, serum: 20 u[IU]/mL (ref 3–28)

## 2013-10-03 LAB — MAGNESIUM: Magnesium: 2 mg/dL (ref 1.5–2.5)

## 2013-10-03 LAB — TSH: TSH: 3.046 u[IU]/mL (ref 0.350–4.500)

## 2013-10-09 ENCOUNTER — Other Ambulatory Visit: Payer: Self-pay | Admitting: Internal Medicine

## 2013-11-03 ENCOUNTER — Other Ambulatory Visit: Payer: Self-pay | Admitting: Physician Assistant

## 2013-11-06 ENCOUNTER — Ambulatory Visit: Payer: Self-pay | Admitting: Internal Medicine

## 2013-11-06 ENCOUNTER — Ambulatory Visit (INDEPENDENT_AMBULATORY_CARE_PROVIDER_SITE_OTHER): Payer: Medicare Other | Admitting: Internal Medicine

## 2013-11-06 ENCOUNTER — Encounter: Payer: Self-pay | Admitting: Internal Medicine

## 2013-11-06 VITALS — BP 126/80 | HR 56 | Temp 96.8°F | Resp 16 | Ht 65.5 in | Wt 228.2 lb

## 2013-11-06 DIAGNOSIS — Z79899 Other long term (current) drug therapy: Secondary | ICD-10-CM

## 2013-11-06 DIAGNOSIS — R5381 Other malaise: Secondary | ICD-10-CM

## 2013-11-06 DIAGNOSIS — M79609 Pain in unspecified limb: Secondary | ICD-10-CM

## 2013-11-06 DIAGNOSIS — J449 Chronic obstructive pulmonary disease, unspecified: Secondary | ICD-10-CM

## 2013-11-06 DIAGNOSIS — I10 Essential (primary) hypertension: Secondary | ICD-10-CM

## 2013-11-06 DIAGNOSIS — R5383 Other fatigue: Principal | ICD-10-CM

## 2013-11-06 LAB — CBC WITH DIFFERENTIAL/PLATELET
BASOS PCT: 1 % (ref 0–1)
Basophils Absolute: 0.1 10*3/uL (ref 0.0–0.1)
EOS ABS: 0.2 10*3/uL (ref 0.0–0.7)
EOS PCT: 3 % (ref 0–5)
HCT: 45.9 % (ref 36.0–46.0)
Hemoglobin: 16.2 g/dL — ABNORMAL HIGH (ref 12.0–15.0)
LYMPHS ABS: 2.3 10*3/uL (ref 0.7–4.0)
Lymphocytes Relative: 29 % (ref 12–46)
MCH: 32.6 pg (ref 26.0–34.0)
MCHC: 35.3 g/dL (ref 30.0–36.0)
MCV: 92.4 fL (ref 78.0–100.0)
MONOS PCT: 11 % (ref 3–12)
Monocytes Absolute: 0.9 10*3/uL (ref 0.1–1.0)
Neutro Abs: 4.5 10*3/uL (ref 1.7–7.7)
Neutrophils Relative %: 56 % (ref 43–77)
Platelets: 259 10*3/uL (ref 150–400)
RBC: 4.97 MIL/uL (ref 3.87–5.11)
RDW: 14.2 % (ref 11.5–15.5)
WBC: 8.1 10*3/uL (ref 4.0–10.5)

## 2013-11-06 NOTE — Progress Notes (Deleted)
Patient ID: Heather Stevenson, female   DOB: [birth year], 80 y.o.   MRN: [id number]

## 2013-11-06 NOTE — Patient Instructions (Signed)
Chronic Obstructive Pulmonary Disease  Chronic obstructive pulmonary disease (COPD) is a common lung condition in which airflow from the lungs is limited. COPD is a general term that can be used to describe many different lung problems that limit airflow, including both chronic bronchitis and emphysema.  If you have COPD, your lung function will probably never return to normal, but there are measures you can take to improve lung function and make yourself feel better.   CAUSES   · Smoking (common).    · Exposure to secondhand smoke.    · Genetic problems.  · Chronic inflammatory lung diseases or recurrent infections.  SYMPTOMS   · Shortness of breath, especially with physical activity.    · Deep, persistent (chronic) cough with a large amount of thick mucus.    · Wheezing.    · Rapid breaths (tachypnea).    · Gray or bluish discoloration (cyanosis) of the skin, especially in fingers, toes, or lips.    · Fatigue.    · Weight loss.    · Frequent infections or episodes when breathing symptoms become much worse (exacerbations).    · Chest tightness.  DIAGNOSIS   Your healthcare provider will take a medical history and perform a physical examination to make the initial diagnosis.  Additional tests for COPD may include:   · Lung (pulmonary) function tests.  · Chest X-ray.  · CT scan.  · Blood tests.  TREATMENT   Treatment available to help you feel better when you have COPD include:   · Inhaler and nebulizer medicines. These help manage the symptoms of COPD and make your breathing more comfortable  · Supplemental oxygen. Supplemental oxygen is only helpful if you have a low oxygen level in your blood.    · Exercise and physical activity. These are beneficial for nearly all people with COPD. Some people may also benefit from a pulmonary rehabilitation program.  HOME CARE INSTRUCTIONS   · Take all medicines (inhaled or pills) as directed by your health care provider.  · Only take over-the-counter or prescription medicines  for pain, fever, or discomfort as directed by your health care provider.    · Avoid over-the-counter medicines or cough syrups that dry up your airway (such as antihistamines) and slow down the elimination of secretions unless instructed otherwise by your healthcare provider.    · If you are a smoker, the most important thing that you can do is stop smoking. Continuing to smoke will cause further lung damage and breathing trouble. Ask your health care provider for help with quitting smoking. He or she can direct you to community resources or hospitals that provide support.  · Avoid exposure to irritants such as smoke, chemicals, and fumes that aggravate your breathing.  · Use oxygen therapy and pulmonary rehabilitation if directed by your health care provider. If you require home oxygen therapy, ask your healthcare provider whether you should purchase a pulse oximeter to measure your oxygen level at home.    · Avoid contact with individuals who have a contagious illness.  · Avoid extreme temperature and humidity changes.  · Eat healthy foods. Eating smaller, more frequent meals and resting before meals may help you maintain your strength.  · Stay active, but balance activity with periods of rest. Exercise and physical activity will help you maintain your ability to do things you want to do.  · Preventing infection and hospitalization is very important when you have COPD. Make sure to receive all the vaccines your health care provider recommends, especially the pneumococcal and influenza vaccines. Ask your healthcare provider whether you   need a pneumonia vaccine.  · Learn and use relaxation techniques to manage stress.  · Learn and use controlled breathing techniques as directed by your health care provider. Controlled breathing techniques include:    · Pursed lip breathing. Start by breathing in (inhaling) through your nose for 1 second. Then, purse your lips as if you were going to whistle and breathe out (exhale)  through the pursed lips for 2 seconds.    · Diaphragmatic breathing. Start by putting one hand on your abdomen just above your waist. Inhale slowly through your nose. The hand on your abdomen should move out. Then purse your lips and exhale slowly. You should be able to feel the hand on your abdomen moving in as you exhale.    · Learn and use controlled coughing to clear mucus from your lungs. Controlled coughing is a series of short, progressive coughs. The steps of controlled coughing are:    1. Lean your head slightly forward.    2. Breathe in deeply using diaphragmatic breathing.    3. Try to hold your breath for 3 seconds.    4. Keep your mouth slightly open while coughing twice.    5. Spit any mucus out into a tissue.    6. Rest and repeat the steps once or twice as needed.  SEEK MEDICAL CARE IF:   · You are coughing up more mucus than usual.    · There is a change in the color or thickness of your mucus.    · Your breathing is more labored than usual.    · Your breathing is faster than usual.    SEEK IMMEDIATE MEDICAL CARE IF:   · You have shortness of breath while you are resting.    · You have shortness of breath that prevents you from:  · Being able to talk.    · Performing your usual physical activities.    · You have chest pain lasting longer than 5 minutes.    · Your skin color is more cyanotic than usual.  · You measure low oxygen saturations for longer than 5 minutes with a pulse oximeter.  MAKE SURE YOU:   · Understand these instructions.  · Will watch your condition.  · Will get help right away if you are not doing well or get worse.  Document Released: 04/05/2005 Document Revised: 04/16/2013 Document Reviewed: 02/20/2013  [employer]® Patient Information ©2014 [employer].

## 2013-11-06 NOTE — Progress Notes (Signed)
Subjective:    Patient ID: Heather Stevenson, female    DOB: 02/14/1930, 80 y.o.   MRN: [id number]  HPI One month F/U visit for treatment of AECB. Patient had Sx of Breo to compliment her Spiriva and was also treated with a course of Keflex and prednisone taper. She admits she breathes better with the addition of the [facility], but in general she does not feel well - just tired and fatigued all day. She continues to smoke about 1/2 PPD. Systems Review is totally negative otherwise.    Medication List       This list is accurate as of: 11/06/13  6:23 PM.  Always use your most recent med list.               allopurinol 300 MG tablet  Commonly known as:  ZYLOPRIM  TAKE 1 TABLET ONCE DAILY. FOR GOUT     aspirin EC 325 MG tablet  Take 325 mg by mouth at bedtime.     CENTRUM tablet  Take 1 tablet by mouth daily.     citalopram 20 MG tablet  Commonly known as:  CELEXA  TAKE 1 TABLET ONCE DAILY.     CVS VIT D 5000 HIGH-POTENCY PO  Take 5,000 Units by mouth daily.     DEXILANT 60 MG capsule  Generic drug:  dexlansoprazole  Take 60 mg by mouth daily.     fenofibrate micronized 134 MG capsule  Commonly known as:  LOFIBRA  TAKE 1 CAPSULE ONCE DAILY FOR BLOOD FATS.     fish oil-omega-3 fatty acids 1000 MG capsule  Take 1 g by mouth 2 (two) times daily.     Flax Seed Oil 1000 MG Caps  Take 1,000 mg by mouth 2 (two) times daily.     Magnesium 250 MG Tabs  Take 250 mg by mouth daily.     meloxicam 15 MG tablet  Commonly known as:  MOBIC  Take 15 mg by mouth daily.     metoprolol tartrate 25 MG tablet  Commonly known as:  LOPRESSOR  TAKE (1/2) TABLET TWICE DAILY FOR BLOOD PRESSURE     montelukast 10 MG tablet  Commonly known as:  SINGULAIR     predniSONE 20 MG tablet  Commonly known as:  DELTASONE  Take 1 tablet (20 mg total) by mouth See admin instructions. 1 tab 3 x day for 3 days, then 1 tab 2 x day for 3 days, then 1 tab 1 x day for 5 days     PROAIR HFA 108 (90 BASE)  MCG/ACT inhaler  Generic drug:  albuterol     rosuvastatin 5 MG tablet  Commonly known as:  CRESTOR  Take 5 mg by mouth at bedtime.     SPIRIVA HANDIHALER 18 MCG inhalation capsule  Generic drug:  tiotropium  Place 18 mcg into inhaler and inhale daily.     VITAMIN B 12 PO  Take 500 mcg by mouth daily.     vitamin C 500 MG tablet  Commonly known as:  ASCORBIC ACID  Take 500 mg by mouth daily.         Allergies  Allergen Reactions  . Hctz [Hydrochlorothiazide]   . Sulfa Antibiotics   . Tetracyclines & Related     Past Medical History  Diagnosis Date  . Hyperlipidemia   . Gout   . History of small bowel obstruction   . COPD (chronic obstructive pulmonary disease)   . Substance abuse  Tobacco  . Depression   . Pulmonary hypertension   . Umbilical hernia   . Bronchitis   . Atrial fibrillation   . High cholesterol   . Pneumonia   . Arthritis   . Abdominal aortic aneurysm     followed by Dr Kellie Simmering- 4.8 cm per [place] 6/12- EPIC  . Complication of anesthesia     slow to wake up per pt- anesthesia record on chart  . Hypertension   . Anxiety   . Atherosclerosis of aorta    Review of Systems In addition to the HPI above,  No Fever-chills,  No Headache, No changes with Vision or hearing,  No problems swallowing food or Liquids,  No Chest pain or palpitations, No Abdominal pain, No Nausea or Vommitting, Bowel movements are regular,  No Blood in stool or Urine,  No dysuria,  No new skin rashes or bruises,  No new joints pains-aches,  No new weakness, tingling, numbness in any extremity,  No recent weight loss,  No polyuria, polydypsia or polyphagia,  No significant Mental Stressors.  A full 10 point Review of Systems was done, except as stated above, all other Review of Systems were negative  Objective:   Physical Exam  BP 126/80  Pulse 56  Temp 96.8 F   Resp 16  Ht 5' 5.5"   Wt 228 lb 3.2 oz   BMI 37.38 kg   /   O2 sat  =  96% at rest  HEENT - Eac's  patent. TM's Nl.EOM's full. PERRLA. NasoOroPharynx clear. Neck - supple. Nl Thyroid. No bruits nodes JVD Chest - Scattered coarse rales & rhonchi Cor - Nl HS. RRR w/o sig MGR. PP 1(+) No edema. Abd - No palpable organomegaly, masses or tenderness. BS nl. MS- FROM. w/o deformities. Muscle power tone and bulk Nl. Gait Nl. Neuro - No obvious Cr N abnormalities. Sensory, motor and Cerebellar functions appear Nl w/o focal abnormalities.  Assessment & Plan:   1. Other malaise and fatigue - TSH  2. Pain in limb - CK  3. Obstructive chronic bronchitis without exacerbation - DG Chest 2 View; Future  4. Unspecified essential hypertension   5. Encounter for long-term (current) use of other medications - CBC with Differential - BASIC METABOLIC PANEL WITH GFR - Hepatic function panel - Magnesium

## 2013-11-07 LAB — HEPATIC FUNCTION PANEL
ALBUMIN: 3.8 g/dL (ref 3.5–5.2)
ALT: 20 U/L (ref 0–35)
AST: 21 U/L (ref 0–37)
Alkaline Phosphatase: 74 U/L (ref 39–117)
Bilirubin, Direct: 0.2 mg/dL (ref 0.0–0.3)
Indirect Bilirubin: 0.3 mg/dL (ref 0.2–1.2)
TOTAL PROTEIN: 6.6 g/dL (ref 6.0–8.3)
Total Bilirubin: 0.5 mg/dL (ref 0.2–1.2)

## 2013-11-07 LAB — BASIC METABOLIC PANEL WITH GFR
BUN: 29 mg/dL — AB (ref 6–23)
CALCIUM: 10.1 mg/dL (ref 8.4–10.5)
CO2: 26 meq/L (ref 19–32)
CREATININE: 1.28 mg/dL — AB (ref 0.50–1.10)
Chloride: 100 mEq/L (ref 96–112)
GFR, Est African American: 45 mL/min — ABNORMAL LOW
GFR, Est Non African American: 39 mL/min — ABNORMAL LOW
GLUCOSE: 110 mg/dL — AB (ref 70–99)
Potassium: 4.7 mEq/L (ref 3.5–5.3)
Sodium: 137 mEq/L (ref 135–145)

## 2013-11-07 LAB — CK: Total CK: 44 U/L (ref 7–177)

## 2013-11-07 LAB — TSH: TSH: 2.529 u[IU]/mL (ref 0.350–4.500)

## 2013-11-07 LAB — MAGNESIUM: MAGNESIUM: 1.9 mg/dL (ref 1.5–2.5)

## 2013-11-09 ENCOUNTER — Ambulatory Visit (HOSPITAL_COMMUNITY)
Admission: RE | Admit: 2013-11-09 | Discharge: 2013-11-09 | Disposition: A | Discharge status: Home / Self Care | Payer: Medicare Other | Source: Ambulatory Visit | Attending: Internal Medicine | Admitting: Internal Medicine

## 2013-11-09 DIAGNOSIS — R05 Cough: Secondary | ICD-10-CM | POA: Insufficient documentation

## 2013-11-09 DIAGNOSIS — I10 Essential (primary) hypertension: Secondary | ICD-10-CM | POA: Insufficient documentation

## 2013-11-09 DIAGNOSIS — J984 Other disorders of lung: Secondary | ICD-10-CM | POA: Insufficient documentation

## 2013-11-09 DIAGNOSIS — R059 Cough, unspecified: Secondary | ICD-10-CM | POA: Insufficient documentation

## 2013-11-09 DIAGNOSIS — J449 Chronic obstructive pulmonary disease, unspecified: Secondary | ICD-10-CM

## 2013-11-25 ENCOUNTER — Encounter: Payer: Self-pay | Admitting: Emergency Medicine

## 2013-11-25 ENCOUNTER — Ambulatory Visit (INDEPENDENT_AMBULATORY_CARE_PROVIDER_SITE_OTHER): Payer: Medicare Other | Admitting: Emergency Medicine

## 2013-11-25 VITALS — BP 124/86 | HR 60 | Temp 98.6°F | Resp 18 | Ht 65.5 in | Wt 232.0 lb

## 2013-11-25 DIAGNOSIS — F329 Major depressive disorder, single episode, unspecified: Secondary | ICD-10-CM

## 2013-11-25 DIAGNOSIS — R239 Unspecified skin changes: Secondary | ICD-10-CM

## 2013-11-25 DIAGNOSIS — F3289 Other specified depressive episodes: Secondary | ICD-10-CM

## 2013-11-25 DIAGNOSIS — F32A Depression, unspecified: Secondary | ICD-10-CM

## 2013-11-25 DIAGNOSIS — J449 Chronic obstructive pulmonary disease, unspecified: Secondary | ICD-10-CM

## 2013-11-25 DIAGNOSIS — R5383 Other fatigue: Principal | ICD-10-CM

## 2013-11-25 DIAGNOSIS — R350 Frequency of micturition: Secondary | ICD-10-CM

## 2013-11-25 DIAGNOSIS — R5381 Other malaise: Secondary | ICD-10-CM

## 2013-11-25 LAB — CBC WITH DIFFERENTIAL/PLATELET
Basophils Absolute: 0 10*3/uL (ref 0.0–0.1)
Basophils Relative: 0 % (ref 0–1)
EOS ABS: 0.3 10*3/uL (ref 0.0–0.7)
EOS PCT: 4 % (ref 0–5)
HEMATOCRIT: 41.2 % (ref 36.0–46.0)
HEMOGLOBIN: 14.1 g/dL (ref 12.0–15.0)
LYMPHS ABS: 1.7 10*3/uL (ref 0.7–4.0)
Lymphocytes Relative: 22 % (ref 12–46)
MCH: 32.1 pg (ref 26.0–34.0)
MCHC: 34.2 g/dL (ref 30.0–36.0)
MCV: 93.8 fL (ref 78.0–100.0)
MONO ABS: 0.7 10*3/uL (ref 0.1–1.0)
MONOS PCT: 9 % (ref 3–12)
NEUTROS PCT: 65 % (ref 43–77)
Neutro Abs: 5.1 10*3/uL (ref 1.7–7.7)
Platelets: 230 10*3/uL (ref 150–400)
RBC: 4.39 MIL/uL (ref 3.87–5.11)
RDW: 14.1 % (ref 11.5–15.5)
WBC: 7.9 10*3/uL (ref 4.0–10.5)

## 2013-11-25 LAB — BASIC METABOLIC PANEL WITH GFR
BUN: 47 mg/dL — AB (ref 6–23)
CALCIUM: 9.2 mg/dL (ref 8.4–10.5)
CO2: 28 mEq/L (ref 19–32)
Chloride: 102 mEq/L (ref 96–112)
Creat: 1.92 mg/dL — ABNORMAL HIGH (ref 0.50–1.10)
GFR, EST AFRICAN AMERICAN: 28 mL/min — AB
GFR, EST NON AFRICAN AMERICAN: 24 mL/min — AB
Glucose, Bld: 113 mg/dL — ABNORMAL HIGH (ref 70–99)
Potassium: 5 mEq/L (ref 3.5–5.3)
Sodium: 136 mEq/L (ref 135–145)

## 2013-11-25 NOTE — Progress Notes (Signed)
Subjective:    Patient ID: Heather Stevenson, female    DOB: 05/25/1930, 80 y.o.   MRN: [id number]  HPI Comments: 80 yo WF presents with daughters to discuss depression. She notes + lack of interest for several months. She is not keeping active and notes breathing has worsened. She has not been doing her meds AD for her COPD and is still smoking. She notes depression has increased since sister passed. She has not tried counseling and lives in assisted living. She notes she does have urinary frequency but denies any other UTI symptoms..   She also notes spot on right forearm has changed. She notes wormlike growth has now occurred w/o any injury.     Medication List       This list is accurate as of: 11/25/13  4:42 PM.  Always use your most recent med list.               allopurinol 300 MG tablet  Commonly known as:  ZYLOPRIM  TAKE 1 TABLET ONCE DAILY. FOR GOUT     aspirin EC 325 MG tablet  Take 325 mg by mouth at bedtime.     BREO ELLIPTA 100-25 MCG/INH Aepb  Generic drug:  Fluticasone Furoate-Vilanterol  Inhale into the lungs.     CENTRUM tablet  Take 1 tablet by mouth daily.     citalopram 20 MG tablet  Commonly known as:  CELEXA  TAKE 1 TABLET ONCE DAILY.     CVS VIT D 5000 HIGH-POTENCY PO  Take 5,000 Units by mouth daily.     DEXILANT 60 MG capsule  Generic drug:  dexlansoprazole  Take 60 mg by mouth daily.     fenofibrate micronized 134 MG capsule  Commonly known as:  LOFIBRA  TAKE 1 CAPSULE ONCE DAILY FOR BLOOD FATS.     fish oil-omega-3 fatty acids 1000 MG capsule  Take 1 g by mouth 2 (two) times daily.     Flax Seed Oil 1000 MG Caps  Take 1,000 mg by mouth 2 (two) times daily.     Magnesium 250 MG Tabs  Take 250 mg by mouth daily.     meloxicam 15 MG tablet  Commonly known as:  MOBIC  Take 15 mg by mouth daily.     metoprolol tartrate 25 MG tablet  Commonly known as:  LOPRESSOR  TAKE (1/2) TABLET TWICE DAILY FOR BLOOD PRESSURE     montelukast 10  MG tablet  Commonly known as:  SINGULAIR     PROAIR HFA 108 (90 BASE) MCG/ACT inhaler  Generic drug:  albuterol     rosuvastatin 5 MG tablet  Commonly known as:  CRESTOR  Take 5 mg by mouth at bedtime.     VITAMIN B 12 PO  Take 500 mcg by mouth daily.     vitamin C 500 MG tablet  Commonly known as:  ASCORBIC ACID  Take 500 mg by mouth daily.       Allergies  Allergen Reactions  . Hctz [Hydrochlorothiazide]   . Sulfa Antibiotics   . Tetracyclines & Related    Past Medical History  Diagnosis Date  . Hyperlipidemia   . Gout   . History of small bowel obstruction   . COPD (chronic obstructive pulmonary disease)   . Substance abuse     Tobacco  . Depression   . Pulmonary hypertension   . Umbilical hernia   . Bronchitis   . Atrial fibrillation   . High cholesterol   .  Pneumonia   . Arthritis   . Abdominal aortic aneurysm     followed by Dr Kellie Simmering- 4.8 cm per [place] 6/12- EPIC  . Complication of anesthesia     slow to wake up per pt- anesthesia record on chart  . Hypertension   . Anxiety   . Atherosclerosis of aorta       Review of Systems BP 124/86  Pulse 60  Temp(Src) 98.6 F (37 C) (Temporal)  Resp 18  Ht 5' 5.5" (1.664 m)  Wt 232 lb (105.235 kg)  BMI 38.01 kg/m2     Objective:   Physical Exam  Nursing note and vitals reviewed. Constitutional: She is oriented to person, place, and time. She appears well-developed and well-nourished. No distress.  obese  HENT:  Head: Normocephalic and atraumatic.  Right Ear: External ear normal.  Left Ear: External ear normal.  Nose: Nose normal.  Mouth/Throat: Oropharynx is clear and moist. No oropharyngeal exudate.  Eyes: Conjunctivae and EOM are normal.  Neck: Normal range of motion. Neck supple. No JVD present. No thyromegaly present.  Cardiovascular: Normal rate, regular rhythm, normal heart sounds and intact distal pulses.   Pulmonary/Chest: Effort normal and breath sounds normal.  Abdominal: Soft. Bowel  sounds are normal. She exhibits no distension. There is no tenderness. There is no rebound and no guarding.  Musculoskeletal: Normal range of motion. She exhibits no edema and no tenderness.  Lymphadenopathy:    She has no cervical adenopathy.  Neurological: She is alert and oriented to person, place, and time. No cranial nerve deficit.  Skin: Skin is warm and dry. No rash noted. No erythema. No pallor.  Right proximal forearm quarter size brown area with new snake like growth and irritation at base  Psychiatric: Her behavior is normal. Judgment and thought content normal.  Mildly depressed, answers questions appropriately          Assessment & Plan:  D/C FS, FO, Spiriva, Metoprolol, Meloxicam, celexa  1. Fatigue with SOB vs Depression- Trial of Fetzima SX given x 4 weeks take with fatty meal. Fatigue- check labs, increase activity and H2O. SOB- Needs to take Anoro AD sx x 4 given if no change needs referral Pulm and sleep study  2. Urine freq- check labs, hygiene explained  3. Skin change- refer DERM

## 2013-11-25 NOTE — Patient Instructions (Signed)

## 2013-11-26 LAB — URINALYSIS, MICROSCOPIC ONLY
Bacteria, UA: NONE SEEN
Crystals: NONE SEEN

## 2013-11-26 LAB — URINALYSIS, ROUTINE W REFLEX MICROSCOPIC
Bilirubin Urine: NEGATIVE
Glucose, UA: NEGATIVE mg/dL
Hgb urine dipstick: NEGATIVE
Ketones, ur: NEGATIVE mg/dL
LEUKOCYTES UA: NEGATIVE
Nitrite: NEGATIVE
PROTEIN: 30 mg/dL — AB
Specific Gravity, Urine: 1.012 (ref 1.005–1.030)
Urobilinogen, UA: 0.2 mg/dL (ref 0.0–1.0)
pH: 7 (ref 5.0–8.0)

## 2013-11-26 LAB — TSH: TSH: 3.084 u[IU]/mL (ref 0.350–4.500)

## 2013-11-27 LAB — URINE CULTURE
Colony Count: NO GROWTH
Organism ID, Bacteria: NO GROWTH

## 2013-11-28 ENCOUNTER — Emergency Department (HOSPITAL_BASED_OUTPATIENT_CLINIC_OR_DEPARTMENT_OTHER)
Admission: EM | Admit: 2013-11-28 | Discharge: 2013-11-28 | Disposition: A | Discharge status: Home / Self Care | Payer: Medicare Other | Attending: Emergency Medicine | Admitting: Emergency Medicine

## 2013-11-28 ENCOUNTER — Encounter (HOSPITAL_BASED_OUTPATIENT_CLINIC_OR_DEPARTMENT_OTHER): Payer: Self-pay | Admitting: Emergency Medicine

## 2013-11-28 ENCOUNTER — Emergency Department (HOSPITAL_BASED_OUTPATIENT_CLINIC_OR_DEPARTMENT_OTHER): Payer: Medicare Other

## 2013-11-28 ENCOUNTER — Other Ambulatory Visit: Payer: Medicare Other

## 2013-11-28 DIAGNOSIS — J4489 Other specified chronic obstructive pulmonary disease: Secondary | ICD-10-CM | POA: Insufficient documentation

## 2013-11-28 DIAGNOSIS — F411 Generalized anxiety disorder: Secondary | ICD-10-CM | POA: Insufficient documentation

## 2013-11-28 DIAGNOSIS — Z79899 Other long term (current) drug therapy: Secondary | ICD-10-CM | POA: Insufficient documentation

## 2013-11-28 DIAGNOSIS — R112 Nausea with vomiting, unspecified: Secondary | ICD-10-CM | POA: Insufficient documentation

## 2013-11-28 DIAGNOSIS — F32A Depression, unspecified: Secondary | ICD-10-CM

## 2013-11-28 DIAGNOSIS — F329 Major depressive disorder, single episode, unspecified: Secondary | ICD-10-CM

## 2013-11-28 DIAGNOSIS — R6889 Other general symptoms and signs: Secondary | ICD-10-CM

## 2013-11-28 DIAGNOSIS — E7800 Pure hypercholesterolemia, unspecified: Secondary | ICD-10-CM | POA: Insufficient documentation

## 2013-11-28 DIAGNOSIS — F3289 Other specified depressive episodes: Secondary | ICD-10-CM | POA: Insufficient documentation

## 2013-11-28 DIAGNOSIS — F17200 Nicotine dependence, unspecified, uncomplicated: Secondary | ICD-10-CM | POA: Insufficient documentation

## 2013-11-28 DIAGNOSIS — M129 Arthropathy, unspecified: Secondary | ICD-10-CM | POA: Insufficient documentation

## 2013-11-28 DIAGNOSIS — Z8719 Personal history of other diseases of the digestive system: Secondary | ICD-10-CM | POA: Insufficient documentation

## 2013-11-28 DIAGNOSIS — IMO0002 Reserved for concepts with insufficient information to code with codable children: Secondary | ICD-10-CM | POA: Insufficient documentation

## 2013-11-28 DIAGNOSIS — R1110 Vomiting, unspecified: Secondary | ICD-10-CM

## 2013-11-28 DIAGNOSIS — I4891 Unspecified atrial fibrillation: Secondary | ICD-10-CM | POA: Insufficient documentation

## 2013-11-28 DIAGNOSIS — R000 Tachycardia, unspecified: Secondary | ICD-10-CM | POA: Insufficient documentation

## 2013-11-28 DIAGNOSIS — J449 Chronic obstructive pulmonary disease, unspecified: Secondary | ICD-10-CM | POA: Insufficient documentation

## 2013-11-28 DIAGNOSIS — Z7982 Long term (current) use of aspirin: Secondary | ICD-10-CM | POA: Insufficient documentation

## 2013-11-28 DIAGNOSIS — I10 Essential (primary) hypertension: Secondary | ICD-10-CM | POA: Insufficient documentation

## 2013-11-28 DIAGNOSIS — Z8701 Personal history of pneumonia (recurrent): Secondary | ICD-10-CM | POA: Insufficient documentation

## 2013-11-28 DIAGNOSIS — E785 Hyperlipidemia, unspecified: Secondary | ICD-10-CM | POA: Insufficient documentation

## 2013-11-28 LAB — CBC WITH DIFFERENTIAL/PLATELET
BASOS ABS: 0 10*3/uL (ref 0.0–0.1)
Basophils Relative: 0 % (ref 0–1)
EOS ABS: 0.3 10*3/uL (ref 0.0–0.7)
Eosinophils Relative: 3 % (ref 0–5)
HCT: 44.4 % (ref 36.0–46.0)
Hemoglobin: 15.6 g/dL — ABNORMAL HIGH (ref 12.0–15.0)
LYMPHS PCT: 19 % (ref 12–46)
Lymphs Abs: 1.5 10*3/uL (ref 0.7–4.0)
MCH: 33.3 pg (ref 26.0–34.0)
MCHC: 35.1 g/dL (ref 30.0–36.0)
MCV: 94.7 fL (ref 78.0–100.0)
Monocytes Absolute: 0.7 10*3/uL (ref 0.1–1.0)
Monocytes Relative: 9 % (ref 3–12)
NEUTROS PCT: 69 % (ref 43–77)
Neutro Abs: 5.6 10*3/uL (ref 1.7–7.7)
PLATELETS: ADEQUATE 10*3/uL (ref 150–400)
RBC: 4.69 MIL/uL (ref 3.87–5.11)
RDW: 13.9 % (ref 11.5–15.5)
WBC: 8.1 10*3/uL (ref 4.0–10.5)

## 2013-11-28 LAB — BASIC METABOLIC PANEL WITH GFR
BUN: 40 mg/dL — AB (ref 6–23)
CHLORIDE: 98 meq/L (ref 96–112)
CO2: 26 mEq/L (ref 19–32)
Calcium: 9.6 mg/dL (ref 8.4–10.5)
Creat: 2 mg/dL — ABNORMAL HIGH (ref 0.50–1.10)
GFR, Est African American: 26 mL/min — ABNORMAL LOW
GFR, Est Non African American: 23 mL/min — ABNORMAL LOW
GLUCOSE: 116 mg/dL — AB (ref 70–99)
POTASSIUM: 4.7 meq/L (ref 3.5–5.3)
Sodium: 139 mEq/L (ref 135–145)

## 2013-11-28 LAB — COMPREHENSIVE METABOLIC PANEL
ALT: 16 U/L (ref 0–35)
AST: 25 U/L (ref 0–37)
Albumin: 3.5 g/dL (ref 3.5–5.2)
Alkaline Phosphatase: 62 U/L (ref 39–117)
BILIRUBIN TOTAL: 0.4 mg/dL (ref 0.3–1.2)
BUN: 41 mg/dL — AB (ref 6–23)
CHLORIDE: 99 meq/L (ref 96–112)
CO2: 22 meq/L (ref 19–32)
CREATININE: 2 mg/dL — AB (ref 0.50–1.10)
Calcium: 9.5 mg/dL (ref 8.4–10.5)
GFR calc Af Amer: 26 mL/min — ABNORMAL LOW (ref >90)
GFR, EST NON AFRICAN AMERICAN: 22 mL/min — AB (ref >90)
GLUCOSE: 135 mg/dL — AB (ref 70–99)
Potassium: 4.1 mEq/L (ref 3.7–5.3)
Sodium: 139 mEq/L (ref 137–147)
Total Protein: 7.3 g/dL (ref 6.0–8.3)

## 2013-11-28 MED ORDER — METOPROLOL TARTRATE 50 MG PO TABS
ORAL_TABLET | ORAL | Status: AC
  Filled 2013-11-28: qty 1

## 2013-11-28 MED ORDER — SODIUM CHLORIDE 0.9 % IV BOLUS (SEPSIS): 500.0000 mL | Freq: Once | INTRAVENOUS | Status: DC

## 2013-11-28 MED ORDER — SODIUM CHLORIDE 0.9 % IV BOLUS (SEPSIS)
500.0000 mL | Freq: Once | INTRAVENOUS | Status: AC
  Administered 2013-11-28: 500 mL via INTRAVENOUS

## 2013-11-28 MED ORDER — METOPROLOL TARTRATE 50 MG PO TABS
25.0000 mg | ORAL_TABLET | Freq: Once | ORAL | Status: AC
  Administered 2013-11-28: 25 mg via ORAL
  Filled 2013-11-28: qty 1

## 2013-11-28 MED ORDER — ONDANSETRON HCL 4 MG PO TABS: 4.0000 mg | ORAL_TABLET | Freq: Four times a day (QID) | ORAL | Status: DC

## 2013-11-28 NOTE — ED Provider Notes (Signed)
CSN: [id number]     Arrival date & time 11/28/13  2009 History   First MD Initiated Contact with Patient 11/28/13 2013     Chief Complaint  Patient presents with  . Depression  . Dehydration     (Consider location/radiation/quality/duration/timing/severity/associated sxs/prior Treatment) Patient is a 80 y.o. female presenting with vomiting. The history is provided by the patient and a relative. No language interpreter was used.  Emesis Severity:  Mild Duration:  2 days Associated symptoms: no abdominal pain and no diarrhea   Associated symptoms comment:  Patient and daughter report that for the past 2 days, since starting an antidepressant, the patient has been vomiting. She reports that she has not been eating or drinking enough fluids and feels she may be dehydrated. No fever or pain. She denies SOB, but has had a non-productive cough. No dysuria.    Past Medical History  Diagnosis Date  . Hyperlipidemia   . Gout   . History of small bowel obstruction   . COPD (chronic obstructive pulmonary disease)   . Substance abuse     Tobacco  . Depression   . Pulmonary hypertension   . Umbilical hernia   . Bronchitis   . Atrial fibrillation   . High cholesterol   . Pneumonia   . Arthritis   . Abdominal aortic aneurysm     followed by Dr Kellie Simmering- 4.8 cm per [place] 6/12- EPIC  . Complication of anesthesia     slow to wake up per pt- anesthesia record on chart  . Hypertension   . Anxiety   . Atherosclerosis of aorta    Past Surgical History  Procedure Laterality Date  . Small intestine surgery  05/2010  . Abdominal hysterectomy      Vaginal  . Hernia repair  05/2010    incarcerated Umbilical hernia  . Tonsillectomy    . Incisional hernia repair  02/21/2012    Procedure: LAPAROSCOPIC INCISIONAL HERNIA;  Surgeon: Harl Bowie, MD;  Location: [facility];  Service: General;  Laterality: N/A;   Family History  Problem Relation Age of Onset  . Cancer Mother   . Heart disease Father    . Cancer Daughter   . Deep vein thrombosis Daughter    History  Substance Use Topics  . Smoking status: Current Every Day Smoker -- 0.50 packs/day for 50 years    Types: Cigarettes    Last Attempt to Quit: 02/07/2012  . Smokeless tobacco: Never Used  . Alcohol Use: No   OB History   Grav Para Term Preterm Abortions TAB SAB Ect Mult Living                 Review of Systems  Constitutional: Negative for fever.  Respiratory: Positive for cough. Negative for shortness of breath.   Cardiovascular: Negative for chest pain.  Gastrointestinal: Positive for nausea and vomiting. Negative for abdominal pain and diarrhea.      Allergies  Hctz; Sulfa antibiotics; and Tetracyclines & related  Home Medications   Prior to Admission medications   Medication Sig Start Date End Date Taking? Authorizing Provider  allopurinol (ZYLOPRIM) 300 MG tablet TAKE 1 TABLET ONCE DAILY. FOR GOUT 09/29/13   Vicie Mutters, PA-C  aspirin EC 325 MG tablet Take 325 mg by mouth at bedtime.    Historical Provider, MD  Cholecalciferol (CVS VIT D 5000 HIGH-POTENCY PO) Take 5,000 Units by mouth daily.     Historical Provider, MD  Cyanocobalamin (VITAMIN B 12 PO) Take 500  mcg by mouth daily.     Historical Provider, MD  dexlansoprazole (DEXILANT) 60 MG capsule Take 60 mg by mouth daily.    Historical Provider, MD  fenofibrate micronized (LOFIBRA) 134 MG capsule TAKE 1 CAPSULE ONCE DAILY FOR BLOOD FATS. 11/03/13   Vicie Mutters, PA-C  fish oil-omega-3 fatty acids 1000 MG capsule Take 1 g by mouth 2 (two) times daily.    Historical Provider, MD  Flaxseed, Linseed, (FLAX SEED OIL) 1000 MG CAPS Take 1,000 mg by mouth 2 (two) times daily.     Historical Provider, MD  Fluticasone Furoate-Vilanterol (BREO ELLIPTA) 100-25 MCG/INH AEPB Inhale into the lungs.    Historical Provider, MD  Magnesium 250 MG TABS Take 250 mg by mouth daily.    Historical Provider, MD  montelukast (SINGULAIR) 10 MG tablet  09/13/13   Historical  Provider, MD  Multiple Vitamins-Minerals (CENTRUM) tablet Take 1 tablet by mouth daily.     Historical Provider, MD  PROAIR HFA 108 (90 BASE) MCG/ACT inhaler  04/01/13   Historical Provider, MD  rosuvastatin (CRESTOR) 5 MG tablet Take 5 mg by mouth at bedtime.     Historical Provider, MD  vitamin C (ASCORBIC ACID) 500 MG tablet Take 500 mg by mouth daily.     Historical Provider, MD   BP 136/89  Pulse 102  Temp(Src) 97.5 F (36.4 C) (Oral)  Resp 22  Ht 5' 5" (1.651 m)  Wt 200 lb (90.719 kg)  BMI 33.28 kg/m2  SpO2 98% Physical Exam  Constitutional: She is oriented to person, place, and time. She appears well-developed and well-nourished. No distress.  HENT:  Head: Normocephalic.  Mouth/Throat: Mucous membranes are dry.  Eyes: Conjunctivae are normal.  Neck: Normal range of motion.  Cardiovascular: An irregularly irregular rhythm present. Tachycardia present.   No murmur heard. Pulmonary/Chest: Effort normal. She has no wheezes. She has no rales.  Abdominal: Soft. There is no tenderness. There is no rebound and no guarding.  Musculoskeletal: Normal range of motion.  Neurological: She is alert and oriented to person, place, and time.  Skin: Skin is warm and dry.  Psychiatric: She has a normal mood and affect.    ED Course  Procedures (including critical care time) Labs Review Labs Reviewed  COMPREHENSIVE METABOLIC PANEL - Abnormal; Notable for the following:    Glucose, Bld 135 (*)    BUN 41 (*)    Creatinine, Ser 2.00 (*)    GFR calc non Af Amer 22 (*)    GFR calc Af Amer 26 (*)    All other components within normal limits  CBC WITH DIFFERENTIAL  URINALYSIS, ROUTINE W REFLEX MICROSCOPIC    Imaging Review Dg Chest Portable 1 View  11/28/2013   CLINICAL DATA:  Dehydration.  EXAM: PORTABLE CHEST - 1 VIEW  COMPARISON:  11/09/2013  FINDINGS: Cardiac silhouette is normal in size. The aorta is tortuous. No mediastinal or hilar masses. There is diffuse vascular prominence which  is stable. Apical pleural parenchymal scarring is stable. No lung consolidation. No overt edema. No pleural effusion or pneumothorax.  Bony thorax is demineralized but grossly intact.  IMPRESSION: No acute cardiopulmonary disease.   Electronically Signed   By: Lajean Manes M.D.   On: 11/28/2013 21:06     EKG Interpretation   Date/Time:  Friday Nov 28 2013 21:07:18 EDT Ventricular Rate:  117 PR Interval:    QRS Duration: 76 QT Interval:  338 QTC Calculation: 471 R Axis:   -41 Text Interpretation:  Atrial  fibrillation with rapid ventricular response  Left axis deviation Abnormal ECG since last tracing no significant change  Confirmed by BELFI  MD, MELANIE ([id number]) on 11/28/2013 9:24:20 PM      MDM   Final diagnoses:  None    1. Atrial fibrillation 2. Vomiting 3. Depression  The patient and daughter report she stopped taking Metoprolol as directed by her primary care office when started on the antidepressant 2 days ago. She is found to be in atrial fibrillation tonight with RVR to around 117. No palpitations or chest pain. She is not anticoagulated. She was taking metoprolol for a-fib previously. Will start back on metoprolol tonight and recommend follow up Monday with PCP.   Labs pending. Patient care left with Dr. Tamera Punt pending lab results and re-hydration. She is not suicidal or homicidal and has appropriate follow up with PCP for symptoms of depression.    Dewaine Oats, PA-C 11/28/13 2148  Dewaine Oats, PA-C 11/28/13 2148

## 2013-11-28 NOTE — ED Provider Notes (Addendum)
Medical screening examination/treatment/procedure(s) were conducted as a shared visit with non-physician practitioner(s) and myself.  I personally evaluated the patient during the encounter.   EKG Interpretation   Date/Time:  Friday Nov 28 2013 21:07:18 EDT Ventricular Rate:  117 PR Interval:    QRS Duration: 76 QT Interval:  338 QTC Calculation: 471 R Axis:   -41 Text Interpretation:  Atrial fibrillation with rapid ventricular response  Left axis deviation Abnormal ECG since last tracing no significant change  Confirmed by Eren Ryser  MD, Jirah Rider ([id number]) on 11/28/2013 9:24:20 PM      PT with n/v since starting Fetzima.  No abd pain.  No diarrhea.  No fevers.  Mild cough, but no increased congestion or SOB.  Labs unremarkable, creatinine elevated from 3 weeks ago, but same as value 3 days ago.  PMD following.  Had urine and negative urine culture 3 days ago.  Given IVFs, no ongoing vomiting.  Ready to go home.  Is in a-fib with RVR.  HR decreasing since given metoprolol.  Advised pt to restart metoprolol and f/u with her PMD on Tuesday.  Malvin Johns, MD 11/28/13 [place], MD 11/28/13 2312

## 2013-11-28 NOTE — Discharge Instructions (Signed)
RESTART YOUR METOPROLOL AS DIRECTED TOMORROW, 12.5 MG TWICE DAILY. CONTINUE YOUR OTHER MEDICATIONS. FOLLOW UP WITH DR. Melford Aase ON Tuesday OF NEXT WEEK FOR RECHECK. RETURN HERE IF SYMPTOMS WORSEN OR FOR NEW CONCERN.  Atrial Fibrillation Atrial fibrillation is a condition that causes your heart to beat irregularly. It may also cause your heart to beat faster than normal. Atrial fibrillation can prevent your heart from pumping blood normally. It increases your risk of stroke and heart problems. HOME CARE  Take medications as told by your doctor.  Only take medications that your doctor says are safe. Some medications can make the condition worse or happen again.  If blood thinners were prescribed by your doctor, take them exactly as told. Too much can cause bleeding. Too little and you will not have the needed protection against stroke and other problems.  Perform blood tests at home if told by your doctor.  Perform blood tests exactly as told by your doctor.  Do not drink alcohol.  Do not drink beverages with caffeine such as coffee, soda, and some teas.  Maintain a healthy weight.  Do not use diet pills unless your doctor says they are safe. They may make heart problems worse.  Follow diet instructions as told by your doctor.  Exercise regularly as told by your doctor.  Keep all follow-up appointments. GET HELP RIGHT AWAY IF:   You have chest or belly (abdominal) pain.  You feel sick to your stomach (nauseous)  You suddenly have swollen feet and ankles.  You feel dizzy.  You face, arms, or legs feel numb or weak.  There is a change in your vision or speech.  You notice a change in the speed, rhythm, or strength of your heartbeat.  You suddenly begin peeing (urinating) more often.  You get tired more easily when moving or exercising. MAKE SURE YOU:   Understand these instructions.  Will watch your condition.  Will get help right away if you are not doing well or get  worse. Document Released: 04/04/2008 Document Revised: 10/21/2012 Document Reviewed: 08/06/2012 [facility] Patient Information 2014 [place].

## 2013-11-28 NOTE — ED Notes (Signed)
Pt. Has been seen by PMD due to depression and not eating.  Pt. Was to take a new depression med and could not keep med down.  Pt. Took med on wed. And her PMD said today the Pt. Needs to continue the medication.  Pt. Has vomited everything she tries to eat.

## 2013-12-03 ENCOUNTER — Other Ambulatory Visit: Payer: Self-pay | Admitting: *Deleted

## 2013-12-03 DIAGNOSIS — R9431 Abnormal electrocardiogram [ECG] [EKG]: Secondary | ICD-10-CM

## 2013-12-04 ENCOUNTER — Encounter: Payer: Self-pay | Admitting: Emergency Medicine

## 2013-12-04 ENCOUNTER — Inpatient Hospital Stay (HOSPITAL_COMMUNITY)
Admission: EM | Admit: 2013-12-04 | Discharge: 2013-12-09 | DRG: 291 | Disposition: A | Discharge status: Home Health | Payer: Medicare Other | Attending: Internal Medicine | Admitting: Internal Medicine

## 2013-12-04 ENCOUNTER — Emergency Department (HOSPITAL_COMMUNITY): Payer: Medicare Other

## 2013-12-04 ENCOUNTER — Ambulatory Visit (INDEPENDENT_AMBULATORY_CARE_PROVIDER_SITE_OTHER): Payer: Medicare Other | Admitting: Emergency Medicine

## 2013-12-04 ENCOUNTER — Telehealth: Payer: Self-pay | Admitting: *Deleted

## 2013-12-04 ENCOUNTER — Encounter (HOSPITAL_COMMUNITY): Payer: Self-pay | Admitting: Emergency Medicine

## 2013-12-04 VITALS — BP 142/90 | HR 110 | Temp 98.2°F | Resp 18 | Ht 65.5 in | Wt 230.0 lb

## 2013-12-04 DIAGNOSIS — Z7982 Long term (current) use of aspirin: Secondary | ICD-10-CM

## 2013-12-04 DIAGNOSIS — I714 Abdominal aortic aneurysm, without rupture, unspecified: Secondary | ICD-10-CM

## 2013-12-04 DIAGNOSIS — N184 Chronic kidney disease, stage 4 (severe): Secondary | ICD-10-CM | POA: Diagnosis present

## 2013-12-04 DIAGNOSIS — E119 Type 2 diabetes mellitus without complications: Secondary | ICD-10-CM | POA: Diagnosis present

## 2013-12-04 DIAGNOSIS — J189 Pneumonia, unspecified organism: Secondary | ICD-10-CM | POA: Diagnosis present

## 2013-12-04 DIAGNOSIS — Z8719 Personal history of other diseases of the digestive system: Secondary | ICD-10-CM

## 2013-12-04 DIAGNOSIS — I700 Atherosclerosis of aorta: Secondary | ICD-10-CM | POA: Diagnosis present

## 2013-12-04 DIAGNOSIS — R5383 Other fatigue: Secondary | ICD-10-CM

## 2013-12-04 DIAGNOSIS — N179 Acute kidney failure, unspecified: Secondary | ICD-10-CM | POA: Diagnosis present

## 2013-12-04 DIAGNOSIS — E669 Obesity, unspecified: Secondary | ICD-10-CM

## 2013-12-04 DIAGNOSIS — J449 Chronic obstructive pulmonary disease, unspecified: Secondary | ICD-10-CM

## 2013-12-04 DIAGNOSIS — I5030 Unspecified diastolic (congestive) heart failure: Secondary | ICD-10-CM

## 2013-12-04 DIAGNOSIS — Z888 Allergy status to other drugs, medicaments and biological substances status: Secondary | ICD-10-CM

## 2013-12-04 DIAGNOSIS — I2510 Atherosclerotic heart disease of native coronary artery without angina pectoris: Secondary | ICD-10-CM | POA: Diagnosis present

## 2013-12-04 DIAGNOSIS — I5043 Acute on chronic combined systolic (congestive) and diastolic (congestive) heart failure: Principal | ICD-10-CM | POA: Diagnosis present

## 2013-12-04 DIAGNOSIS — F411 Generalized anxiety disorder: Secondary | ICD-10-CM | POA: Diagnosis present

## 2013-12-04 DIAGNOSIS — E1129 Type 2 diabetes mellitus with other diabetic kidney complication: Secondary | ICD-10-CM | POA: Diagnosis present

## 2013-12-04 DIAGNOSIS — E559 Vitamin D deficiency, unspecified: Secondary | ICD-10-CM

## 2013-12-04 DIAGNOSIS — Z79899 Other long term (current) drug therapy: Secondary | ICD-10-CM

## 2013-12-04 DIAGNOSIS — I4891 Unspecified atrial fibrillation: Secondary | ICD-10-CM

## 2013-12-04 DIAGNOSIS — I129 Hypertensive chronic kidney disease with stage 1 through stage 4 chronic kidney disease, or unspecified chronic kidney disease: Secondary | ICD-10-CM | POA: Diagnosis present

## 2013-12-04 DIAGNOSIS — I10 Essential (primary) hypertension: Secondary | ICD-10-CM

## 2013-12-04 DIAGNOSIS — R5381 Other malaise: Secondary | ICD-10-CM | POA: Diagnosis present

## 2013-12-04 DIAGNOSIS — E860 Dehydration: Secondary | ICD-10-CM | POA: Diagnosis present

## 2013-12-04 DIAGNOSIS — N189 Chronic kidney disease, unspecified: Secondary | ICD-10-CM

## 2013-12-04 DIAGNOSIS — F17200 Nicotine dependence, unspecified, uncomplicated: Secondary | ICD-10-CM | POA: Diagnosis present

## 2013-12-04 DIAGNOSIS — E785 Hyperlipidemia, unspecified: Secondary | ICD-10-CM

## 2013-12-04 DIAGNOSIS — I509 Heart failure, unspecified: Secondary | ICD-10-CM

## 2013-12-04 DIAGNOSIS — Z882 Allergy status to sulfonamides status: Secondary | ICD-10-CM

## 2013-12-04 DIAGNOSIS — Z6836 Body mass index (BMI) 36.0-36.9, adult: Secondary | ICD-10-CM

## 2013-12-04 DIAGNOSIS — J4489 Other specified chronic obstructive pulmonary disease: Secondary | ICD-10-CM | POA: Diagnosis present

## 2013-12-04 DIAGNOSIS — M109 Gout, unspecified: Secondary | ICD-10-CM | POA: Diagnosis present

## 2013-12-04 DIAGNOSIS — Z8249 Family history of ischemic heart disease and other diseases of the circulatory system: Secondary | ICD-10-CM

## 2013-12-04 DIAGNOSIS — K432 Incisional hernia without obstruction or gangrene: Secondary | ICD-10-CM

## 2013-12-04 DIAGNOSIS — J9601 Acute respiratory failure with hypoxia: Secondary | ICD-10-CM

## 2013-12-04 DIAGNOSIS — J9600 Acute respiratory failure, unspecified whether with hypoxia or hypercapnia: Secondary | ICD-10-CM | POA: Diagnosis present

## 2013-12-04 DIAGNOSIS — R7309 Other abnormal glucose: Secondary | ICD-10-CM

## 2013-12-04 LAB — CBC WITH DIFFERENTIAL/PLATELET
Basophils Absolute: 0 10*3/uL (ref 0.0–0.1)
Basophils Relative: 0 % (ref 0–1)
Eosinophils Absolute: 0.1 10*3/uL (ref 0.0–0.7)
Eosinophils Relative: 1 % (ref 0–5)
HEMATOCRIT: 41.2 % (ref 36.0–46.0)
HEMOGLOBIN: 14.4 g/dL (ref 12.0–15.0)
LYMPHS PCT: 23 % (ref 12–46)
Lymphs Abs: 1.8 10*3/uL (ref 0.7–4.0)
MCH: 33 pg (ref 26.0–34.0)
MCHC: 35 g/dL (ref 30.0–36.0)
MCV: 94.5 fL (ref 78.0–100.0)
MONOS PCT: 7 % (ref 3–12)
Monocytes Absolute: 0.5 10*3/uL (ref 0.1–1.0)
NEUTROS PCT: 69 % (ref 43–77)
Neutro Abs: 5.4 10*3/uL (ref 1.7–7.7)
Platelets: DECREASED 10*3/uL (ref 150–400)
RBC: 4.36 MIL/uL (ref 3.87–5.11)
RDW: 14.2 % (ref 11.5–15.5)
WBC: 7.8 10*3/uL (ref 4.0–10.5)

## 2013-12-04 LAB — PRO B NATRIURETIC PEPTIDE: PRO B NATRI PEPTIDE: 10429 pg/mL — AB (ref 0–450)

## 2013-12-04 LAB — URINALYSIS, ROUTINE W REFLEX MICROSCOPIC
Bilirubin Urine: NEGATIVE
GLUCOSE, UA: NEGATIVE mg/dL
Hgb urine dipstick: NEGATIVE
Ketones, ur: NEGATIVE mg/dL
LEUKOCYTES UA: NEGATIVE
Nitrite: NEGATIVE
PH: 6 (ref 5.0–8.0)
Protein, ur: 30 mg/dL — AB
SPECIFIC GRAVITY, URINE: 1.019 (ref 1.005–1.030)
Urobilinogen, UA: 1 mg/dL (ref 0.0–1.0)

## 2013-12-04 LAB — URINE MICROSCOPIC-ADD ON

## 2013-12-04 LAB — COMPREHENSIVE METABOLIC PANEL
ALBUMIN: 3.4 g/dL — AB (ref 3.5–5.2)
ALK PHOS: 69 U/L (ref 39–117)
ALT: 21 U/L (ref 0–35)
AST: 26 U/L (ref 0–37)
BUN: 34 mg/dL — AB (ref 6–23)
CHLORIDE: 97 meq/L (ref 96–112)
CO2: 24 mEq/L (ref 19–32)
Calcium: 9.3 mg/dL (ref 8.4–10.5)
Creatinine, Ser: 1.71 mg/dL — ABNORMAL HIGH (ref 0.50–1.10)
GFR calc Af Amer: 31 mL/min — ABNORMAL LOW (ref >90)
GFR calc non Af Amer: 27 mL/min — ABNORMAL LOW (ref >90)
Glucose, Bld: 109 mg/dL — ABNORMAL HIGH (ref 70–99)
POTASSIUM: 4.4 meq/L (ref 3.7–5.3)
Sodium: 136 mEq/L — ABNORMAL LOW (ref 137–147)
Total Bilirubin: 0.7 mg/dL (ref 0.3–1.2)
Total Protein: 7.2 g/dL (ref 6.0–8.3)

## 2013-12-04 LAB — I-STAT TROPONIN, ED: Troponin i, poc: 0.03 ng/mL (ref 0.00–0.08)

## 2013-12-04 LAB — TSH: TSH: 2.17 u[IU]/mL (ref 0.350–4.500)

## 2013-12-04 LAB — TROPONIN I: Troponin I: <0.3 ng/mL (ref <0.30)

## 2013-12-04 LAB — MRSA PCR SCREENING: MRSA by PCR: NEGATIVE

## 2013-12-04 LAB — MAGNESIUM: Magnesium: 2.1 mg/dL (ref 1.5–2.5)

## 2013-12-04 LAB — I-STAT CG4 LACTIC ACID, ED: LACTIC ACID, VENOUS: 1.37 mmol/L (ref 0.5–2.2)

## 2013-12-04 MED ORDER — BENZONATATE 100 MG PO CAPS
200.0000 mg | ORAL_CAPSULE | Freq: Three times a day (TID) | ORAL | Status: DC | PRN
  Filled 2013-12-04: qty 2

## 2013-12-04 MED ORDER — FLUTICASONE FUROATE-VILANTEROL 100-25 MCG/INH IN AEPB: 1.0000 | INHALATION_SPRAY | Freq: Every day | RESPIRATORY_TRACT | Status: DC

## 2013-12-04 MED ORDER — MONTELUKAST SODIUM 10 MG PO TABS
10.0000 mg | ORAL_TABLET | Freq: Every day | ORAL | Status: DC
  Administered 2013-12-04 – 2013-12-08 (×5): 10 mg via ORAL
  Filled 2013-12-04 (×6): qty 1

## 2013-12-04 MED ORDER — DILTIAZEM HCL 25 MG/5ML IV SOLN
10.0000 mg | Freq: Once | INTRAVENOUS | Status: AC
  Administered 2013-12-04: 10 mg via INTRAVENOUS
  Filled 2013-12-04: qty 5

## 2013-12-04 MED ORDER — VITAMIN C 500 MG PO TABS
500.0000 mg | ORAL_TABLET | Freq: Every day | ORAL | Status: DC
  Administered 2013-12-04 – 2013-12-08 (×5): 500 mg via ORAL
  Filled 2013-12-04 (×6): qty 1

## 2013-12-04 MED ORDER — SENNA 8.6 MG PO TABS
1.0000 | ORAL_TABLET | Freq: Every day | ORAL | Status: DC | PRN
  Administered 2013-12-07: 8.6 mg via ORAL
  Filled 2013-12-04 (×2): qty 1

## 2013-12-04 MED ORDER — CYANOCOBALAMIN 500 MCG PO TABS
500.0000 ug | ORAL_TABLET | Freq: Every day | ORAL | Status: DC
  Administered 2013-12-04 – 2013-12-09 (×6): 500 ug via ORAL
  Filled 2013-12-04 (×6): qty 1

## 2013-12-04 MED ORDER — GUAIFENESIN ER 600 MG PO TB12
600.0000 mg | ORAL_TABLET | Freq: Two times a day (BID) | ORAL | Status: DC
  Administered 2013-12-04 – 2013-12-09 (×10): 600 mg via ORAL
  Filled 2013-12-04 (×11): qty 1

## 2013-12-04 MED ORDER — SODIUM CHLORIDE 0.9 % IJ SOLN: 3.0000 mL | Freq: Two times a day (BID) | INTRAMUSCULAR | Status: DC

## 2013-12-04 MED ORDER — ALLOPURINOL 300 MG PO TABS
300.0000 mg | ORAL_TABLET | Freq: Every day | ORAL | Status: DC
  Administered 2013-12-04 – 2013-12-09 (×6): 300 mg via ORAL
  Filled 2013-12-04 (×6): qty 1

## 2013-12-04 MED ORDER — FUROSEMIDE 10 MG/ML IJ SOLN
40.0000 mg | Freq: Once | INTRAMUSCULAR | Status: AC
  Administered 2013-12-04: 40 mg via INTRAVENOUS
  Filled 2013-12-04: qty 4

## 2013-12-04 MED ORDER — ASPIRIN EC 81 MG PO TBEC
81.0000 mg | DELAYED_RELEASE_TABLET | Freq: Every day | ORAL | Status: DC
  Administered 2013-12-04 – 2013-12-09 (×6): 81 mg via ORAL
  Filled 2013-12-04 (×6): qty 1

## 2013-12-04 MED ORDER — LEVALBUTEROL HCL 0.63 MG/3ML IN NEBU
0.6300 mg | INHALATION_SOLUTION | Freq: Four times a day (QID) | RESPIRATORY_TRACT | Status: AC
  Administered 2013-12-04 – 2013-12-05 (×3): 0.63 mg via RESPIRATORY_TRACT
  Filled 2013-12-04 (×3): qty 3

## 2013-12-04 MED ORDER — LEVALBUTEROL HCL 0.63 MG/3ML IN NEBU: 0.6300 mg | INHALATION_SOLUTION | RESPIRATORY_TRACT | Status: DC | PRN

## 2013-12-04 MED ORDER — FENOFIBRATE 160 MG PO TABS
160.0000 mg | ORAL_TABLET | Freq: Every day | ORAL | Status: DC
  Administered 2013-12-04 – 2013-12-09 (×6): 160 mg via ORAL
  Filled 2013-12-04 (×6): qty 1

## 2013-12-04 MED ORDER — ATORVASTATIN CALCIUM 10 MG PO TABS
10.0000 mg | ORAL_TABLET | Freq: Every day | ORAL | Status: DC
Start: 2013-12-04 — End: 2013-12-09
  Administered 2013-12-04 – 2013-12-08 (×5): 10 mg via ORAL
  Filled 2013-12-04 (×6): qty 1

## 2013-12-04 MED ORDER — ADULT MULTIVITAMIN W/MINERALS CH
1.0000 | ORAL_TABLET | Freq: Every day | ORAL | Status: DC
  Administered 2013-12-04 – 2013-12-09 (×6): 1 via ORAL
  Filled 2013-12-04 (×6): qty 1

## 2013-12-04 MED ORDER — ACETAMINOPHEN 650 MG RE SUPP: 650.0000 mg | Freq: Four times a day (QID) | RECTAL | Status: DC | PRN

## 2013-12-04 MED ORDER — NITROGLYCERIN 0.4 MG SL SUBL: 0.4000 mg | SUBLINGUAL_TABLET | SUBLINGUAL | Status: DC | PRN

## 2013-12-04 MED ORDER — PANTOPRAZOLE SODIUM 40 MG PO TBEC
40.0000 mg | DELAYED_RELEASE_TABLET | Freq: Every day | ORAL | Status: DC
  Administered 2013-12-04 – 2013-12-09 (×6): 40 mg via ORAL
  Filled 2013-12-04 (×6): qty 1

## 2013-12-04 MED ORDER — DILTIAZEM HCL 100 MG IV SOLR
5.0000 mg/h | INTRAVENOUS | Status: AC
  Administered 2013-12-04 – 2013-12-05 (×2): 10 mg/h via INTRAVENOUS
  Filled 2013-12-04: qty 100

## 2013-12-04 MED ORDER — DILTIAZEM HCL 100 MG IV SOLR
5.0000 mg/h | INTRAVENOUS | Status: DC
  Administered 2013-12-04: 5 mg/h via INTRAVENOUS

## 2013-12-04 MED ORDER — ACETAMINOPHEN 325 MG PO TABS: 650.0000 mg | ORAL_TABLET | Freq: Four times a day (QID) | ORAL | Status: DC | PRN

## 2013-12-04 MED ORDER — FUROSEMIDE 10 MG/ML IJ SOLN
40.0000 mg | Freq: Two times a day (BID) | INTRAMUSCULAR | Status: DC
  Administered 2013-12-04: 40 mg via INTRAVENOUS
  Filled 2013-12-04: qty 4

## 2013-12-04 MED ORDER — BUDESONIDE-FORMOTEROL FUMARATE 160-4.5 MCG/ACT IN AERO
2.0000 | INHALATION_SPRAY | Freq: Two times a day (BID) | RESPIRATORY_TRACT | Status: DC
  Administered 2013-12-05 – 2013-12-09 (×8): 2 via RESPIRATORY_TRACT
  Filled 2013-12-04: qty 6

## 2013-12-04 MED ORDER — METOPROLOL TARTRATE 25 MG PO TABS
25.0000 mg | ORAL_TABLET | Freq: Two times a day (BID) | ORAL | Status: DC
  Administered 2013-12-04: 25 mg via ORAL
  Filled 2013-12-04 (×3): qty 1

## 2013-12-04 MED ORDER — SODIUM CHLORIDE 0.9 % IV SOLN
250.0000 mL | INTRAVENOUS | Status: DC | PRN
  Administered 2013-12-04: 21:00:00 via INTRAVENOUS

## 2013-12-04 MED ORDER — MAGNESIUM OXIDE 400 (241.3 MG) MG PO TABS
200.0000 mg | ORAL_TABLET | Freq: Every day | ORAL | Status: DC
  Administered 2013-12-04 – 2013-12-09 (×6): 200 mg via ORAL
  Filled 2013-12-04 (×6): qty 0.5

## 2013-12-04 MED ORDER — SODIUM CHLORIDE 0.9 % IJ SOLN: 3.0000 mL | INTRAMUSCULAR | Status: DC | PRN

## 2013-12-04 MED ORDER — DILTIAZEM HCL 25 MG/5ML IV SOLN
10.0000 mg | Freq: Once | INTRAVENOUS | Status: AC
  Administered 2013-12-04: 10 mg via INTRAVENOUS

## 2013-12-04 MED ORDER — ONDANSETRON 4 MG PO TBDP
4.0000 mg | ORAL_TABLET | Freq: Four times a day (QID) | ORAL | Status: DC | PRN
  Filled 2013-12-04: qty 1

## 2013-12-04 MED ORDER — ALBUTEROL SULFATE (2.5 MG/3ML) 0.083% IN NEBU: 3.0000 mL | INHALATION_SOLUTION | Freq: Four times a day (QID) | RESPIRATORY_TRACT | Status: DC | PRN

## 2013-12-04 NOTE — ED Notes (Signed)
Heart Diet ordered.

## 2013-12-04 NOTE — Progress Notes (Signed)
Subjective:    Patient ID: Heather Stevenson, female    DOB: 1930/03/31, 80 y.o.   MRN: [id number]  HPI Comments: 80 yo WF with close f/u with dehydration/ depression/ fatigue. She has been increasing fluid intake. She is not eating because she is staying nauseated, she has not been taking Dexialnt AD.  She was noted to have Afib at ER and was restarted on her Metoprolol. She was given IV hydration at ER which she denies any improvement of symptoms. She has noted increased swelling in ankles, mild SOB and increased fatigue. She has not continued new depression medicine with increased nausea.   Atrial Fibrillation Past medical history includes atrial fibrillation.     Medication List       This list is accurate as of: 12/04/13 12:49 PM.  Always use your most recent med list.               allopurinol 300 MG tablet  Commonly known as:  ZYLOPRIM  TAKE 1 TABLET ONCE DAILY. FOR GOUT     aspirin EC 325 MG tablet  Take 325 mg by mouth at bedtime.     BREO ELLIPTA 100-25 MCG/INH Aepb  Generic drug:  Fluticasone Furoate-Vilanterol  Inhale into the lungs.     CENTRUM tablet  Take 1 tablet by mouth daily.     [employer] VIT D 5000 HIGH-POTENCY PO  Take 5,000 Units by mouth daily.     DEXILANT 60 MG capsule  Generic drug:  dexlansoprazole  Take 60 mg by mouth daily.     fenofibrate micronized 134 MG capsule  Commonly known as:  LOFIBRA  TAKE 1 CAPSULE ONCE DAILY FOR BLOOD FATS.     Magnesium 250 MG Tabs  Take 250 mg by mouth daily.     metoprolol tartrate 25 MG tablet  Commonly known as:  LOPRESSOR  Take 25 mg by mouth daily. Takes 1/2 pill BID     montelukast 10 MG tablet  Commonly known as:  SINGULAIR     ondansetron 4 MG tablet  Commonly known as:  ZOFRAN  Take 1 tablet (4 mg total) by mouth every 6 (six) hours.     PROAIR HFA 108 (90 BASE) MCG/ACT inhaler  Generic drug:  albuterol     rosuvastatin 5 MG tablet  Commonly known as:  CRESTOR  Take 5 mg by mouth at bedtime.      VITAMIN B 12 PO  Take 500 mcg by mouth daily.     vitamin C 500 MG tablet  Commonly known as:  ASCORBIC ACID  Take 500 mg by mouth daily.        Allergies  Allergen Reactions  . Hctz [Hydrochlorothiazide]   . Sulfa Antibiotics   . Tetracyclines & Related    Past Medical History  Diagnosis Date  . Hyperlipidemia   . Gout   . History of small bowel obstruction   . COPD (chronic obstructive pulmonary disease)   . Substance abuse     Tobacco  . Depression   . Pulmonary hypertension   . Umbilical hernia   . Bronchitis   . Atrial fibrillation   . High cholesterol   . Pneumonia   . Arthritis   . Abdominal aortic aneurysm     followed by Dr Kellie Simmering- 4.8 cm per [place] 6/12- EPIC  . Complication of anesthesia     slow to wake up per pt- anesthesia record on chart  . Hypertension   . Anxiety   .  Atherosclerosis of aorta     Review of Systems  Constitutional: Positive for fatigue.  Gastrointestinal: Positive for nausea.  All other systems reviewed and are negative.  BP 142/90  Pulse 110  Temp(Src) 98.2 F (36.8 C) (Temporal)  Resp 18  Ht 5' 5.5" (1.664 m)  Wt 230 lb (104.327 kg)  BMI 37.68 kg/m2     Objective:   Physical Exam  Nursing note and vitals reviewed. Constitutional: She is oriented to person, place, and time. She appears well-developed and well-nourished. No distress.  Looks pale/ lethargic  HENT:  Head: Normocephalic and atraumatic.  Right Ear: External ear normal.  Left Ear: External ear normal.  Mouth/Throat: Oropharynx is clear and moist.  Eyes: Conjunctivae and EOM are normal.  Neck: Normal range of motion. Neck supple. No JVD present. No thyromegaly present.  Cardiovascular: Normal rate, regular rhythm, normal heart sounds and intact distal pulses.   + edema 2 + bilateral LE  Pulmonary/Chest: Effort normal. She has no rales.  ? Rhonchi/ Rales  Abdominal: Soft. Bowel sounds are normal. She exhibits no distension. There is no tenderness.   Musculoskeletal: Normal range of motion. She exhibits no edema and no tenderness.  Lymphadenopathy:    She has no cervical adenopathy.  Neurological: She is alert and oriented to person, place, and time. No cranial nerve deficit.  Skin: Skin is warm and dry. No rash noted. No erythema. No pallor.  Psychiatric: She has a normal mood and affect. Her behavior is normal. Judgment and thought content normal.          Assessment & Plan:  1. A Fib without improvement with restart metoprolol with increased EDEMA and fatigue ? CHF ref to ER for evaluation  2. Depression- restart Celexa 20 mg , increase to 40 mg with food, needs counseling ASAP

## 2013-12-04 NOTE — ED Notes (Signed)
Hospitalist at bedside assessing patient.

## 2013-12-04 NOTE — ED Notes (Signed)
EDP and daughter at bedside, patient on cardiac, blood pressure, heart rate and pulse ox monitoring.

## 2013-12-04 NOTE — ED Provider Notes (Signed)
CSN: [id number]     Arrival date & time 12/04/13  1326 History   First MD Initiated Contact with Patient 12/04/13 1335     No chief complaint on file.    (Consider location/radiation/quality/duration/timing/severity/associated sxs/prior Treatment) HPI 80 year old female with history of atrial fibrillation currently taking metoprolol, not on any anticoagulants, history of COPD, and history of depression and history of AAA who was sent here from her primary care doctor's office for further evaluation of her atrial fibrillation. Patient was seen in the ED 6 days ago for evaluations of nausea and vomiting after starting on an antidepressant medication that she can't tolerates. Pt sts her metoprolol was on hold while she was taking her antidepressant med.  She was found  to be in A. fib with RVR in ER and was restarted on her metoprolol. She also was getting IV hydration for possible dehydration. Since being discharge, she is getting progressively weak.  Has persistent cough, noticing swelling to her legs and now having worsening shortness of breath.  She sleep with 2 pillows.  She has been taking her metoprolol.  Still endorse nausea.  Her doctor evaluated her today and was concern of her peripheral edema and recommended to come to ER for further care.  No prior hx of CHF.  Denies fever, chills, headache, cp, hemoptysis, abd pain, back pain, or leg pain.  Denies numbness or rash.  Denies SI/HI but admits to being depressed and has decreased appetite.         Past Medical History  Diagnosis Date  . Hyperlipidemia   . Gout   . History of small bowel obstruction   . COPD (chronic obstructive pulmonary disease)   . Substance abuse     Tobacco  . Depression   . Pulmonary hypertension   . Umbilical hernia   . Bronchitis   . Atrial fibrillation   . High cholesterol   . Pneumonia   . Arthritis   . Abdominal aortic aneurysm     followed by Dr Kellie Simmering- 4.8 cm per [place] 6/12- EPIC  . Complication of  anesthesia     slow to wake up per pt- anesthesia record on chart  . Hypertension   . Anxiety   . Atherosclerosis of aorta    Past Surgical History  Procedure Laterality Date  . Small intestine surgery  05/2010  . Abdominal hysterectomy      Vaginal  . Hernia repair  05/2010    incarcerated Umbilical hernia  . Tonsillectomy    . Incisional hernia repair  02/21/2012    Procedure: LAPAROSCOPIC INCISIONAL HERNIA;  Surgeon: Harl , MD;  Location: [facility];  Service: General;  Laterality: N/A;   Family History  Problem Relation Age of Onset  . Cancer Mother   . Heart disease Father   . Cancer Daughter   . Deep vein thrombosis Daughter    History  Substance Use Topics  . Smoking status: Current Every Day Smoker -- 0.50 packs/day for 50 years    Types: Cigarettes  . Smokeless tobacco: Never Used  . Alcohol Use: No   OB History   Grav Para Term Preterm Abortions TAB SAB Ect Mult Living                 Review of Systems  All other systems reviewed and are negative.     Allergies  Hctz; Sulfa antibiotics; and Tetracyclines & related  Home Medications   Prior to Admission medications   Medication Sig Start  Date End Date Taking? Authorizing Provider  allopurinol (ZYLOPRIM) 300 MG tablet TAKE 1 TABLET ONCE DAILY. FOR GOUT 09/29/13   Vicie Mutters, PA-C  aspirin EC 325 MG tablet Take 325 mg by mouth at bedtime.    Historical Provider, MD  Cholecalciferol (CVS VIT D 5000 HIGH-POTENCY PO) Take 5,000 Units by mouth daily.     Historical Provider, MD  Cyanocobalamin (VITAMIN B 12 PO) Take 500 mcg by mouth daily.     Historical Provider, MD  dexlansoprazole (DEXILANT) 60 MG capsule Take 60 mg by mouth daily.    Historical Provider, MD  fenofibrate micronized (LOFIBRA) 134 MG capsule TAKE 1 CAPSULE ONCE DAILY FOR BLOOD FATS. 11/03/13   Vicie Mutters, PA-C  Fluticasone Furoate-Vilanterol (BREO ELLIPTA) 100-25 MCG/INH AEPB Inhale into the lungs.    Historical Provider, MD   Magnesium 250 MG TABS Take 250 mg by mouth daily.    Historical Provider, MD  metoprolol tartrate (LOPRESSOR) 25 MG tablet Take 25 mg by mouth daily. Takes 1/2 pill BID    Historical Provider, MD  montelukast (SINGULAIR) 10 MG tablet  09/13/13   Historical Provider, MD  Multiple Vitamins-Minerals (CENTRUM) tablet Take 1 tablet by mouth daily.     Historical Provider, MD  ondansetron (ZOFRAN) 4 MG tablet Take 1 tablet (4 mg total) by mouth every 6 (six) hours. 11/28/13   Dewaine Oats, PA-C  PROAIR HFA 108 (90 BASE) MCG/ACT inhaler  04/01/13   Historical Provider, MD  rosuvastatin (CRESTOR) 5 MG tablet Take 5 mg by mouth at bedtime.     Historical Provider, MD  vitamin C (ASCORBIC ACID) 500 MG tablet Take 500 mg by mouth daily.     Historical Provider, MD   There were no vitals taken for this visit. Physical Exam  Nursing note and vitals reviewed. Constitutional: She is oriented to person, place, and time. She appears well-developed and well-nourished. No distress.  HENT:  Head: Atraumatic.  Eyes: Conjunctivae are normal.  Neck: Neck supple. No JVD present.  Cardiovascular: Intact distal pulses.   Irregularly irregular rate without M/R/G  Pulmonary/Chest:  Diffused rhonchi with rales heard at base of lungs. No wheezes  Abdominal: Soft. There is no tenderness.  Musculoskeletal: She exhibits edema (bilateral  1+ pitting edema to lower extremities.  ).  Neurological: She is alert and oriented to person, place, and time.  Skin: No rash noted.  Psychiatric: She has a normal mood and affect.    ED Course  Procedures (including critical care time)  1:55 PM Pt here in afib with RVR, but no hypotension.  Is mentating appropriately.  Currently on metoprolol but will need CCB to manage her afib.  She has evidence to suggest new onset CHF and questionable PNA which may explain her presenting sxs.  Plan to initiate cardizem bolus and drip for rate control.  Her BP is stable.  Care discussed with  Dr. Tamera Punt.    4:11 PM Pt currently receiving cardizem bolus and drips for treatment of afib RVR.  She does [place] 65[address].  CXR with mild interstitial prominence without obvious pulmonary edema.  No focal consolidation concerning for PNA.  Pt renal function is slightly better than baseline.  ECG without acute ischemic changes, trop negative.  Normal lactic acid.  Lasix 72m IV given for new onset CHF.  Will weight pt.  Plan to have pt admitted for further management of her CHF and afib.    4:46 PM Will continue to titrates cardizem to  achieve goal HR.  [facility] has been consulted and agrees to admit pt to step down, team 10, under the care of Dr. Sharyl Nimrod  CRITICAL CARE Performed by: Domenic Moras Total critical care time: 45 min Critical care time was exclusive of separately billable procedures and treating other patients. Critical care was necessary to treat or prevent imminent or life-threatening deterioration. Critical care was time spent personally by me on the following activities: development of treatment plan with patient and/or surrogate as well as nursing, discussions with consultants, evaluation of patient's response to treatment, examination of patient, obtaining history from patient or surrogate, ordering and performing treatments and interventions, ordering and review of laboratory studies, ordering and review of radiographic studies, pulse oximetry and re-evaluation of patient's condition.   Labs Review Labs Reviewed  COMPREHENSIVE METABOLIC PANEL - Abnormal; Notable for the following:    Sodium 136 (*)    Glucose, Bld 109 (*)    BUN 34 (*)    Creatinine, Ser 1.71 (*)    Albumin 3.4 (*)    GFR calc non Af Amer 27 (*)    GFR calc Af Amer 31 (*)    All other components within normal limits  URINALYSIS, ROUTINE W REFLEX MICROSCOPIC - Abnormal; Notable for the following:    APPearance CLOUDY (*)    Protein, ur 30 (*)    All other components within normal  limits  PRO B NATRIURETIC PEPTIDE - Abnormal; Notable for the following:    Pro B Natriuretic peptide (BNP) 10429.0 (*)    All other components within normal limits  URINE MICROSCOPIC-ADD ON - Abnormal; Notable for the following:    Squamous Epithelial / LPF FEW (*)    Bacteria, UA MANY (*)    Casts HYALINE CASTS (*)    All other components within normal limits  CBC WITH DIFFERENTIAL  MAGNESIUM  I-STAT CG4 LACTIC ACID, ED  Randolm Idol, ED    Imaging Review Dg Chest Port 1 View  12/04/2013   CLINICAL DATA:  Rales, pleural effusion  EXAM: PORTABLE CHEST - 1 VIEW  COMPARISON:  11/28/2013  FINDINGS: Cardiomediastinal silhouette is stable. S-shaped thoracic scoliosis again noted. Atherosclerotic calcifications of thoracic aorta. Mild interstitial prominence bilaterally without convincing pulmonary edema. Streaky bilateral basilar atelectasis or scarring.  IMPRESSION: Mild interstitial prominence bilaterally without convincing pulmonary edema. Streaky bilateral basilar atelectasis or scarring.   Electronically Signed   By: Lahoma Crocker M.D.   On: 12/04/2013 14:17     EKG Interpretation None      Date: 12/04/2013  Rate: 121  Rhythm: atrial fibrillation  QRS Axis: left  Intervals: normal  ST/T Wave abnormalities: nonspecific ST changes  Conduction Disutrbances:none  Narrative Interpretation:   Old EKG Reviewed: unchanged    MDM   Final diagnoses:  Atrial fibrillation with RVR  CHF, acute    BP 137/86  Pulse 96  Temp(Src) 98.6 F (37 C) (Oral)  Resp 24  Wt 231 lb 8 oz (105.008 kg)  SpO2 97%  I have reviewed nursing notes and vital signs. I personally reviewed the imaging tests through PACS system  I reviewed available ER/hospitalization records thought the EMR     Domenic Moras, [place] 12/04/13 1648

## 2013-12-04 NOTE — ED Notes (Signed)
Pt was sent from DRs office today for decreased activity, increased heart rate and bilateral lower leg edema, Family concerned this was depression, but now concerned it is her heart.  Patient has had lots of stressful events.

## 2013-12-04 NOTE — H&P (Addendum)
[facility] History and Physical  Heather Stevenson VZD:[id number] DOB: 1929/09/15 DOA: 12/04/2013  Referring physician: EDP PCP: Alesia Richards, MD   Chief Complaint: Worsening shortness of breath and leg swelling  HPI: Heather Stevenson is a 80 y.o. female atrial fibrillation, pulmonary hypertension, abdominal aortic areas and followed by Dr. Kellie Simmering, hypertension, hyperlipidemia, renal insufficiency, COPD presents with above complaints. She reports that about 3 weeks ago she developed a cough and was treated with a prolonged course of antibiotics PCP, but the cough has persisted. Also she relates that 6 days ago she went to the [facility] ED with nausea vomiting/decreased by mouth intake and she was found to be dehydrated and treated with IV fluids . She states that at that time she she had been recently started on an antidepressant, and was asked to discontinue her metoprolol prior to that ED visit. While in the ED on 5/22 she was found to be mildly tachycardic and so she was asked to resume her metoprolol, and she was in discharged home. She reports that as already mentioned that cough has persisted and productive of whitish sputum today, she developed worsening shortness of breath over the past week, as well as leg swelling. She admits to two-pillow orthopnea but states that this is long-standing/unchanged. She denies chest pain. In the ED troponin was negative and she was found to be in A. fib with RVR in the 130s>> she was started on Cardizem drip with bolus. BNP was also found to be elevated greater than 10,000, chest x-ray showed mild interstitial prominence without comments in pulmonary edema and she was given IV Lasix. She is admitted for further evaluation and management. Cards was consulted per EDP, admission to [facility] requested    Review of Systems The patient denies anorexia, fever, weight loss,, vision loss, decreased hearing, hoarseness, chest pain, syncope,  balance deficits,  hemoptysis, abdominal pain, melena, hematochezia, severe indigestion/heartburn, hematuria, incontinence, genital sores, suspicious skin lesions, transient blindness, difficulty walking, depression, unusual weight change,  Past Medical History  Diagnosis Date  . Hyperlipidemia   . Gout   . History of small bowel obstruction   . COPD (chronic obstructive pulmonary disease)   . Substance abuse     Tobacco  . Depression   . Pulmonary hypertension   . Umbilical hernia   . Bronchitis   . Atrial fibrillation   . High cholesterol   . Pneumonia   . Arthritis   . Abdominal aortic aneurysm     followed by Dr Kellie Simmering- 4.8 cm per [place] 6/12- [facility]  . Complication of anesthesia     slow to wake up per pt- anesthesia record on chart  . Hypertension   . Anxiety   . Atherosclerosis of aorta    Past Surgical History  Procedure Laterality Date  . Small intestine surgery  05/2010  . Abdominal hysterectomy      Vaginal  . Hernia repair  05/2010    incarcerated Umbilical hernia  . Tonsillectomy    . Incisional hernia repair  02/21/2012    Procedure: LAPAROSCOPIC INCISIONAL HERNIA;  Surgeon: Harl Bowie, MD;  Location: [facility] ORS;  Service: General;  Laterality: N/A;   Social History:  reports that she has been smoking Cigarettes.  She has a 25 pack-year smoking history. She has never used smokeless tobacco. She reports that she does not drink alcohol or use illicit drugs.  Allergies  Allergen Reactions  . Hctz [Hydrochlorothiazide]   . Sulfa Antibiotics   . Tetracyclines &  Related     Family History  Problem Relation Age of Onset  . Cancer Mother   . Heart disease Father   . Cancer Daughter   . Deep vein thrombosis Daughter      Prior to Admission medications   Medication Sig Start Date End Date Taking? Authorizing Provider  allopurinol (ZYLOPRIM) 300 MG tablet Take 300 mg by mouth daily.   Yes Historical Provider, MD  aspirin EC 325 MG tablet Take 325 mg by mouth at bedtime.   Yes  Historical Provider, MD  Cholecalciferol ([employer] VIT D 5000 HIGH-POTENCY PO) Take 5,000 Units by mouth daily.    Yes Historical Provider, MD  cyanocobalamin 500 MCG tablet Take 500 mcg by mouth daily.   Yes Historical Provider, MD  dexlansoprazole (DEXILANT) 60 MG capsule Take 60 mg by mouth daily.   Yes Historical Provider, MD  fenofibrate micronized (LOFIBRA) 134 MG capsule Take 134 mg by mouth daily before breakfast.   Yes Historical Provider, MD  Fluticasone Furoate-Vilanterol (BREO ELLIPTA) 100-25 MCG/INH AEPB Inhale 1 puff into the lungs daily.    Yes Historical Provider, MD  Magnesium 250 MG TABS Take 250 mg by mouth daily.   Yes Historical Provider, MD  metoprolol tartrate (LOPRESSOR) 25 MG tablet Take 12.5 mg by mouth 2 (two) times daily. Takes 1/2 pill BID   Yes Historical Provider, MD  montelukast (SINGULAIR) 10 MG tablet Take 10 mg by mouth at bedtime.  09/13/13  Yes Historical Provider, MD  Multiple Vitamins-Minerals (CENTRUM) tablet Take 1 tablet by mouth daily.    Yes Historical Provider, MD  ondansetron (ZOFRAN-ODT) 4 MG disintegrating tablet Take 4 mg by mouth every 6 (six) hours as needed for nausea or vomiting.   Yes Historical Provider, MD  PROAIR HFA 108 (90 BASE) MCG/ACT inhaler Inhale 2 puffs into the lungs every 6 (six) hours as needed for shortness of breath.  04/01/13  Yes Historical Provider, MD  rosuvastatin (CRESTOR) 5 MG tablet Take 5 mg by mouth at bedtime.    Yes Historical Provider, MD  vitamin C (ASCORBIC ACID) 500 MG tablet Take 500 mg by mouth daily.   Yes Historical Provider, MD   Physical Exam: Filed Vitals:   12/04/13 1730  BP: 144/109  Pulse: 130  Temp:   Resp: 28    BP 144/109  Pulse 130  Temp(Src) 98.6 F (37 C) (Oral)  Resp 28  Wt 105.008 kg (231 lb 8 oz)  SpO2 91% Constitutional: Vital signs reviewed.  Patient is a well-developed and well-nourished  in no acute distress and cooperative with exam. Alert and oriented x3.  Head: Normocephalic and  atraumatic Ear: TM normal bilaterally Nose: No erythema or drainage noted.  Turbinates normal Mouth: no erythema or exudates, MMM Eyes: PERRL, EOMI, conjunctivae normal, No scleral icterus.  Neck: Supple, Trachea midline normal ROM, No JVD, mass, thyromegaly, or carotid bruit present.  Cardiovascular: Irregular irregular, tachycardic S1 normal, S2 normal, no MRG, pulses symmetric and intact bilaterally Pulmonary/Chest: Scattered rhonchi and crackles bilaterally, no wheezes Abdominal: Soft. Non-tender, non-distended, bowel sounds are normal, no masses, organomegaly, or guarding present.  GU: no CVA tenderness  extremities: +1 edema, no cyanosis  Neurological: A&O x3, Strength is normal and symmetric bilaterally, cranial nerve II-XII are grossly intact, no focal motor deficit, sensory intact to light touch bilaterally.  Skin: Warm, dry and intact. No rash, cyanosis, or clubbing.  Psychiatric: Normal mood and affect. speech and behavior is normal. Judgment and thought content normal. Cognition and memory  are normal.                Labs on Admission:  Basic Metabolic Panel:  Recent Labs Lab 11/28/13 1101 11/28/13 2056 12/04/13 1428  NA 139 139 136*  K 4.7 4.1 4.4  CL 98 99 97  CO2 _0 GLUCOSE 116* 135* 109*  BUN 40* 41* 34*  CREATININE 2.00* 2.00* 1.71*  CALCIUM 9.6 9.5 9.3  MG  --   --  2.1   Liver Function Tests:  Recent Labs Lab 11/28/13 2056 12/04/13 1428  AST 25 26  ALT 16 21  ALKPHOS 62 69  BILITOT 0.4 0.7  PROT 7.3 7.2  ALBUMIN 3.5 3.4*   No results found for this basename: LIPASE, AMYLASE,  in the last 168 hours No results found for this basename: AMMONIA,  in the last 168 hours CBC:  Recent Labs Lab 11/28/13 2056 12/04/13 1428  WBC 8.1 7.8  NEUTROABS 5.6 5.4  HGB 15.6* 14.4  HCT 44.4 41.2  MCV 94.7 94.5  PLT PLATELET CLUMPS NOTED ON SMEAR, COUNT APPEARS ADEQUATE PLATELET CLUMPS NOTED ON SMEAR, COUNT APPEARS DECREASED   Cardiac  Enzymes: No results found for this basename: CKTOTAL, CKMB, CKMBINDEX, TROPONINI,  in the last 168 hours  BNP (last 3 results)  Recent Labs  12/04/13 1428  PROBNP 10429.0*   CBG: No results found for this basename: GLUCAP,  in the last 168 hours  Radiological Exams on Admission: Dg Chest Port 1 View  12/04/2013   CLINICAL DATA:  Rales, pleural effusion  EXAM: PORTABLE CHEST - 1 VIEW  COMPARISON:  11/28/2013  FINDINGS: Cardiomediastinal silhouette is stable. S-shaped thoracic scoliosis again noted. Atherosclerotic calcifications of thoracic aorta. Mild interstitial prominence bilaterally without convincing pulmonary edema. Streaky bilateral basilar atelectasis or scarring.  IMPRESSION: Mild interstitial prominence bilaterally without convincing pulmonary edema. Streaky bilateral basilar atelectasis or scarring.   Electronically Signed   By: Lahoma Crocker M.D.   On: 12/04/2013 14:17    EKG: Independently reviewed. Ectopic atrial tachycardia at 121, no acute ischemic changes  Assessment/Plan Active Problems:   Atrial fibrillation with RVR -Patient presenting as discussed above, and EKG as above>> will repeat EKG -Cycle cardiac enzymes, obtain echo and follow. As per history of present illness she was treated for dehydration,? Bronchitis/URI the past week and she also had been off metoprolol for some time and this all likely contributed to the RVR -Will continue Cardizem drip, follow resume metoprolol in a.m. and wean off drip as clinically appropriate -Start her on full dose Lovenox , Follow and reconsult cards in a.m. for further recommendations/further anticoagulation   CHF, acute-likely diastolic -As discussed above, likely precipitated by above -Cycle cardiac enzymes, 2-D echo -Strict I and os, follow - IV Lasix, with close monitoring of renal function   Renal insuff, likely CKD (chronic kidney disease) -Last creatinine on 5/22 was 2>> 1.7 today(noted she was hydrated 5/22 after labs  were obtained)   Hypertension -Monitor her BPs on Cardizem drip follow and further treat accordingly   COPD (chronic obstructive pulmonary disease) -Placed on mucolytics, bronchodilators and follow   H/o PreDiabetes -Obtain A1c, follow recheck CBG in am and further manage accordingly.       Code Status: full Family Communication: .family At bedside Disposition Plan: Admit to step down  Time spent:>28mn  [facility] Pager [phone]

## 2013-12-04 NOTE — Telephone Encounter (Signed)
Patient was advised 11/28/13 to go to ER for IV fluids with abnormal labs, dehydration, and increasing symptoms of depression.  I spoke with patient 12/02/13 and she states she felt beter after receiving 2 bags of IV fluid but still "just doesn't feel well."  I called patient's daughter, Otis Peak, and informed her we need to recheck labs by 12/04/13 and also advised per Kelby Aline, PA-C orders that Ms. Culp needs to be set up to see a counselor soon with increasing symptoms of depression and multiple life changes and personal stressors that have occurred recently.  Per Otis Peak, patient declines counselor and will not agree to see one.  She is concerned about A-fib noted on EKG at hospital and I advised her that her dehydration may be contributing to her abnormal EKG.  Otis Peak states she is concerned patient's symptoms of depression may be steaming from her abnormal EKG.  Per Lenna Sciara, I sent an order for a cardiology referral in and advised Joanie patient still needs to f/u in office for recheck labs. Otis Peak also states recent depression Rx that Melissa gave her is causing patient to be nauseous, and is requesting we restart the Celexa Rx for patient but increase her dose.  Per Lenna Sciara, I advised her that can be discussed at her ov that needs to be scheduled to recheck labs.

## 2013-12-04 NOTE — ED Provider Notes (Signed)
Medical screening examination/treatment/procedure(s) were conducted as a shared visit with non-physician practitioner(s) and myself.  I personally evaluated the patient during the encounter.   EKG Interpretation   Date/Time:  Thursday Dec 04 2013 13:36:34 EDT Ventricular Rate:  121 PR Interval:  171 QRS Duration: 77 QT Interval:  339 QTC Calculation: 481 R Axis:   -16 Text Interpretation:  Age not entered, assumed to be  80 years old for  purpose of ECG interpretation Sinus or ectopic atrial tachycardia Multiple  premature complexes, vent  Borderline left axis deviation Minimal ST  depression, lateral leads Borderline prolonged QT interval Confirmed by  Waylynn Benefiel  MD, Baley Shands ([id number]) on 12/04/2013 7:27:14 PM      Pt with a-fib, hx in past, but only once during a hospitalization.  Over last 5-6 days, increased fatigue, SOB, leg swelling.  Pt in a-fib with RVR, trop neg, BNP markedly elevated.  Given lasix, started on cardizem drip.  Admitted to hospitalist.  CRITICAL CARE Performed by: Malvin Johns Total critical care time: 45 Critical care time was exclusive of separately billable procedures and treating other patients. Critical care was necessary to treat or prevent imminent or life-threatening deterioration. Critical care was time spent personally by me on the following activities: development of treatment plan with patient and/or surrogate as well as nursing, discussions with consultants, evaluation of patient's response to treatment, examination of patient, obtaining history from patient or surrogate, ordering and performing treatments and interventions, ordering and review of laboratory studies, ordering and review of radiographic studies, pulse oximetry and re-evaluation of patient's condition.   Malvin Johns, MD 12/04/13 [phone]

## 2013-12-05 DIAGNOSIS — I059 Rheumatic mitral valve disease, unspecified: Secondary | ICD-10-CM

## 2013-12-05 DIAGNOSIS — J9601 Acute respiratory failure with hypoxia: Secondary | ICD-10-CM | POA: Diagnosis present

## 2013-12-05 LAB — CBC
HCT: 39.6 % (ref 36.0–46.0)
Hemoglobin: 13.5 g/dL (ref 12.0–15.0)
MCH: 32.4 pg (ref 26.0–34.0)
MCHC: 34.1 g/dL (ref 30.0–36.0)
MCV: 95 fL (ref 78.0–100.0)
PLATELETS: 259 10*3/uL (ref 150–400)
RBC: 4.17 MIL/uL (ref 3.87–5.11)
RDW: 14.2 % (ref 11.5–15.5)
WBC: 9.3 10*3/uL (ref 4.0–10.5)

## 2013-12-05 LAB — COMPREHENSIVE METABOLIC PANEL
ALT: 18 U/L (ref 0–35)
AST: 22 U/L (ref 0–37)
Albumin: 3.2 g/dL — ABNORMAL LOW (ref 3.5–5.2)
Alkaline Phosphatase: 64 U/L (ref 39–117)
BILIRUBIN TOTAL: 0.8 mg/dL (ref 0.3–1.2)
BUN: 36 mg/dL — ABNORMAL HIGH (ref 6–23)
CO2: 26 meq/L (ref 19–32)
Calcium: 8.9 mg/dL (ref 8.4–10.5)
Chloride: 93 mEq/L — ABNORMAL LOW (ref 96–112)
Creatinine, Ser: 1.95 mg/dL — ABNORMAL HIGH (ref 0.50–1.10)
GFR calc Af Amer: 26 mL/min — ABNORMAL LOW (ref >90)
GFR, EST NON AFRICAN AMERICAN: 23 mL/min — AB (ref >90)
GLUCOSE: 224 mg/dL — AB (ref 70–99)
Potassium: 3.8 mEq/L (ref 3.7–5.3)
SODIUM: 134 meq/L — AB (ref 137–147)
Total Protein: 6.9 g/dL (ref 6.0–8.3)

## 2013-12-05 LAB — TROPONIN I
Troponin I: <0.3 ng/mL (ref <0.30)
Troponin I: <0.3 ng/mL (ref <0.30)

## 2013-12-05 LAB — GLUCOSE, CAPILLARY
Glucose-Capillary: 103 mg/dL — ABNORMAL HIGH (ref 70–99)
Glucose-Capillary: 137 mg/dL — ABNORMAL HIGH (ref 70–99)
Glucose-Capillary: 186 mg/dL — ABNORMAL HIGH (ref 70–99)

## 2013-12-05 LAB — HEMOGLOBIN A1C
Hgb A1c MFr Bld: 6.5 % — ABNORMAL HIGH (ref <5.7)
Mean Plasma Glucose: 140 mg/dL — ABNORMAL HIGH (ref <117)

## 2013-12-05 MED ORDER — DILTIAZEM HCL 60 MG PO TABS
60.0000 mg | ORAL_TABLET | Freq: Four times a day (QID) | ORAL | Status: AC
  Administered 2013-12-05 – 2013-12-07 (×11): 60 mg via ORAL
  Filled 2013-12-05 (×12): qty 1

## 2013-12-05 MED ORDER — INSULIN ASPART 100 UNIT/ML ~~LOC~~ SOLN
0.0000 [IU] | Freq: Three times a day (TID) | SUBCUTANEOUS | Status: DC
  Administered 2013-12-05 – 2013-12-07 (×3): 1 [IU] via SUBCUTANEOUS

## 2013-12-05 MED ORDER — ENOXAPARIN SODIUM 120 MG/0.8ML ~~LOC~~ SOLN
1.0000 mg/kg | Freq: Two times a day (BID) | SUBCUTANEOUS | Status: DC
  Filled 2013-12-05 (×3): qty 0.8

## 2013-12-05 MED ORDER — FUROSEMIDE 10 MG/ML IJ SOLN
40.0000 mg | Freq: Two times a day (BID) | INTRAMUSCULAR | Status: DC
  Administered 2013-12-05: 40 mg via INTRAVENOUS
  Filled 2013-12-05 (×3): qty 4

## 2013-12-05 MED ORDER — ENOXAPARIN SODIUM 100 MG/ML ~~LOC~~ SOLN
100.0000 mg | SUBCUTANEOUS | Status: DC
  Administered 2013-12-05 – 2013-12-09 (×5): 100 mg via SUBCUTANEOUS
  Filled 2013-12-05 (×5): qty 1

## 2013-12-05 MED ORDER — METOPROLOL TARTRATE 50 MG PO TABS
50.0000 mg | ORAL_TABLET | Freq: Two times a day (BID) | ORAL | Status: AC
  Administered 2013-12-05 – 2013-12-07 (×6): 50 mg via ORAL
  Filled 2013-12-05 (×6): qty 1

## 2013-12-05 NOTE — Care Management Note (Addendum)
    Page 1 of 2   12/09/2013     11:03:57 AM CARE MANAGEMENT NOTE 12/09/2013  Patient:  Heather Stevenson, Heather Stevenson   Account Number:  [id number]  Date Initiated:  12/05/2013  Documentation initiated by:  Heather Stevenson  Subjective/Objective Assessment:   adm w at fib w rvr     Action/Plan:   lives alone, pcp dr Heather Stevenson   Anticipated DC Date:     Anticipated DC Plan:        [place]  CM consult  Medication Assistance      [facility] Choice  HOME HEALTH   Choice offered to / List presented to:  [name]        [place] arranged  HH-1 RN  [place] OT      [place].   Status of service:  Completed, signed off Medicare Important Message given?  YES (If response is "NO", the following Medicare IM given date fields will be blank) Date Medicare IM given:  12/08/2013 Date Additional Medicare IM given:    Discharge Disposition:  [place]  Per UR Regulation:  Reviewed for med. necessity/level of care/duration of stay  If discussed at [place] of Stay Meetings, dates discussed:   12/09/2013    Comments:   12-09-13 [address], [place] [phone] CM did make referral for [place] with [facility]. SOC to begin within 24-48 hours post d/c. No further needs from CM at this time.  12-08-13 [address] Heather Krauss, RN,BSN [phone] Awaiting INR to trend up. Plan to return to [facility] with [facility] services once medically stable for d/c.   5/29 1028a Heather dowell rn,bsn pt has 70.00 per copay if goes home on eliquis. left 30day free eliquis card in room if needed.

## 2013-12-05 NOTE — Progress Notes (Signed)
  Echocardiogram 2D Echocardiogram has been performed.  Heather Stevenson 12/05/2013, 11:55 AM

## 2013-12-05 NOTE — Progress Notes (Signed)
Heather Stevenson - Stepdown / ICU Progress Note  Heather Stevenson GYJ:[id number] DOB: 02-04-1930 DOA: 12/04/2013 PCP: Alesia Richards, MD  Time spent :  35 mins  Brief narrative: 80 year old female patient with chronic atrial fibrillation, pulmonary hypertension with COPD, chronic kidney disease as well as abdominal aortic aneurysm. Presented to the emergency department because of progressive shortness of breath and lower extremity edema. Endorsed 3 weeks prior treated as an outpatient for possible bronchitis or pneumonia and was prescribed antibiotics by her primary care physician. 6 days prior to presentation to the emergency department she developed nausea and vomiting and poor intake and was given IV fluids. Normally on metoprolol but this medication had been discontinued during this same time period. After discontinuation of metoprolol and during another physician evaluation she was found to be tachycardic and therefore her metoprolol was renewed.  Upon initial evaluation in the emergency department she was found to be in nature fibrillation with rapid ventricular response rates in the 130s. Chest x-ray demonstrated mild interstitial pulmonary edema and she was subsequently given IV Lasix.  HPI/Subjective: SOB has markedly improved. No CP.  Assessment/Plan:    Atrial fibrillation with RVR -Rate controlled -transition to PO - increase BB dose and add CCB -was never started on anti coagulation so begin renally adjusted dose Lovenox -if can improve renal function off diuretics can likely transition to Eliquis -CHADVASc 6-7     Acute respiratory failure with hypoxia:   A) Diastolic heart failure, NYHA class 2   B) COPD (chronic obstructive pulmonary disease) -minimal edema on XR -very little response to Lasix and suspect respiratory sx's more r/t RVR -last ECHO 2011- repeat this admit pending -no wheezing so COPD appears compensated    Diabetes -HgbA1c = 6.5 -SSI -diet  management    CKD (chronic kidney disease) -baseline BUN/Cr 29/Stevenson.28 -BUN 36 -follow    Hypertension -poorly controlled -increasing dose of rate control agents should improve BP   DVT prophylaxis: Lovenox Code Status: Full Family Communication: No family at bedside Disposition Plan/Expected LOS: Step down   Consultants: None  Procedures: Echocardiogram - pending  Antibiotics: None  Objective: Blood pressure 121/88, pulse 140, temperature 98.9 F (37.2 C), temperature source Oral, resp. rate 16, height 5' 5.5" (Stevenson.664 m), weight 228 lb 9.9 oz (103.7 kg), SpO2 96.00%.  Intake/Output Summary (Last 24 hours) at 12/05/13 1313 Last data filed at 12/05/13 1000  Gross per 24 hour  Intake 715.42 ml  Output   1250 ml  Net -534.58 ml   Exam: General: No acute respiratory distress Lungs: Clear to auscultation bilaterally without wheezes or crackles, 3 L Cardiovascular: Irregular rate and rhythm without murmur gallop or rub normal S1 and S2, no peripheral edema or JVD Abdomen: Nontender, nondistended, soft, bowel sounds positive, no rebound, no ascites, no appreciable mass Musculoskeletal: No significant cyanosis, clubbing of bilateral lower extremities   Scheduled Meds:  Scheduled Meds: . allopurinol  300 mg Oral Daily  . aspirin EC  81 mg Oral Daily  . atorvastatin  10 mg Oral q1800  . budesonide-formoterol  2 puff Inhalation BID  . cyanocobalamin  500 mcg Oral Daily  . diltiazem  60 mg Oral 4 times per day  . enoxaparin (LOVENOX) injection  100 mg Subcutaneous Q24H  . fenofibrate  160 mg Oral Daily  . guaiFENesin  600 mg Oral BID  . insulin aspart  0-9 Units Subcutaneous TID WC  . magnesium oxide  200 mg Oral Daily  . metoprolol tartrate  50 mg Oral BID  . montelukast  10 mg Oral QHS  . multivitamin with minerals  Stevenson tablet Oral Daily  . pantoprazole  40 mg Oral Daily  . vitamin C  500 mg Oral Daily   Data Reviewed: Basic Metabolic Panel:  Recent Labs Lab  11/28/13 2056 12/04/13 1428 12/05/13 0059  NA 139 136* 134*  K 4.Stevenson 4.4 3.8  CL 99 97 93*  CO2 _0 GLUCOSE 135* 109* 224*  BUN 41* 34* 36*  CREATININE 2.00* Stevenson.71* Stevenson.95*  CALCIUM 9.5 9.3 8.9  MG  --  2.Stevenson  --    Liver Function Tests:  Recent Labs Lab 11/28/13 2056 12/04/13 1428 12/05/13 0059  AST _1 ALT _2 ALKPHOS 62 69 64  BILITOT 0.4 0.7 0.8  PROT 7.3 7.2 6.9  ALBUMIN 3.5 3.4* 3.2*   CBC:  Recent Labs Lab 11/28/13 2056 12/04/13 1428 12/05/13 0059  WBC 8.Stevenson 7.8 9.3  NEUTROABS 5.6 5.4  --   HGB 15.6* 14.4 13.5  HCT 44.4 41.2 39.6  MCV 94.7 94.5 95.0  PLT PLATELET CLUMPS NOTED ON SMEAR, COUNT APPEARS ADEQUATE PLATELET CLUMPS NOTED ON SMEAR, COUNT APPEARS DECREASED 259   Cardiac Enzymes:  Recent Labs Lab 12/04/13 1842 12/05/13 0059 12/05/13 1000  TROPONINI <0.30 <0.30 <0.30   BNP (last 3 results)  Recent Labs  12/04/13 1428  PROBNP 10429.0*   CBG:  Recent Labs Lab 12/05/13 1222  GLUCAP 103*    Recent Results (from the past 240 hour(s))  URINE CULTURE     Status: None   Collection Time    11/25/13  5:21 PM      Result Value Ref Range Status   Colony Count NO GROWTH   Final   Organism ID, Bacteria NO GROWTH   Final  MRSA PCR SCREENING     Status: None   Collection Time    12/04/13  6:43 PM      Result Value Ref Range Status   MRSA by PCR NEGATIVE  NEGATIVE Final   Comment:            The GeneXpert MRSA Assay (FDA     approved for NASAL specimens     only), is one component of a     comprehensive MRSA colonization     surveillance program. It is not     intended to diagnose MRSA     infection nor to guide or     monitor treatment for     MRSA infections.     Studies:  Recent x-ray studies have been reviewed in detail by the Attending Physician  Heather Stevenson, ANP [facility] Office  [phone] Pager [phone]  **If unable to reach the above provider after paging please contact the [place] @  [phone]  On-Call/Text Page:      [email or web address]      password [email or web address]  If 7PM-7AM, please contact night-coverage [email or web address] Password [email or web address] 12/05/2013, Stevenson:13 PM   LOS: Stevenson day   I have personally examined this patient and reviewed the entire database. I have reviewed the above note, made any necessary editorial changes, and agree with its content.  Cherene Altes, MD [facility]

## 2013-12-06 LAB — CBC
HEMATOCRIT: 37.7 % (ref 36.0–46.0)
HEMOGLOBIN: 13.1 g/dL (ref 12.0–15.0)
MCH: 32.6 pg (ref 26.0–34.0)
MCHC: 34.7 g/dL (ref 30.0–36.0)
MCV: 93.8 fL (ref 78.0–100.0)
Platelets: 264 10*3/uL (ref 150–400)
RBC: 4.02 MIL/uL (ref 3.87–5.11)
RDW: 13.9 % (ref 11.5–15.5)
WBC: 9.5 10*3/uL (ref 4.0–10.5)

## 2013-12-06 LAB — GLUCOSE, CAPILLARY
GLUCOSE-CAPILLARY: 126 mg/dL — AB (ref 70–99)
Glucose-Capillary: 89 mg/dL (ref 70–99)
Glucose-Capillary: 117 mg/dL — ABNORMAL HIGH (ref 70–99)
Glucose-Capillary: 180 mg/dL — ABNORMAL HIGH (ref 70–99)

## 2013-12-06 LAB — BASIC METABOLIC PANEL
BUN: 41 mg/dL — AB (ref 6–23)
CHLORIDE: 92 meq/L — AB (ref 96–112)
CO2: 24 mEq/L (ref 19–32)
Calcium: 8.9 mg/dL (ref 8.4–10.5)
Creatinine, Ser: 2.47 mg/dL — ABNORMAL HIGH (ref 0.50–1.10)
GFR, EST AFRICAN AMERICAN: 20 mL/min — AB (ref >90)
GFR, EST NON AFRICAN AMERICAN: 17 mL/min — AB (ref >90)
Glucose, Bld: 113 mg/dL — ABNORMAL HIGH (ref 70–99)
POTASSIUM: 3.5 meq/L — AB (ref 3.7–5.3)
SODIUM: 131 meq/L — AB (ref 137–147)

## 2013-12-06 MED ORDER — SODIUM CHLORIDE 0.9 % IV SOLN
INTRAVENOUS | Status: DC
  Administered 2013-12-06 – 2013-12-07 (×2): via INTRAVENOUS

## 2013-12-06 MED ORDER — SODIUM CHLORIDE 0.9 % IV BOLUS (SEPSIS)
250.0000 mL | Freq: Once | INTRAVENOUS | Status: AC
  Administered 2013-12-06: 250 mL via INTRAVENOUS

## 2013-12-06 MED ORDER — OFF THE BEAT BOOK
Freq: Once | Status: AC
  Administered 2013-12-07: 18:00:00
  Filled 2013-12-06: qty 1

## 2013-12-06 NOTE — Progress Notes (Signed)
Report called to 3w

## 2013-12-06 NOTE — Progress Notes (Signed)
[name]  Heather Stevenson INO:[id number] DOB: 1930-03-20 DOA: 12/04/2013 PCP: Alesia Richards, MD  Brief narrative: 80 yo F w/ chronic atrial fibrillation, pulmonary hypertension with COPD, chronic kidney disease, as well as an abdominal aortic aneurysm who presented to the ED w/ progressive shortness of breath and lower extremity edema. Endorsed 3 weeks prior treated as an outpatient for possible bronchitis or pneumonia and was prescribed antibiotics by her PCP. 6 days prior to presentation she developed nausea and vomiting and poor intake and was given IV fluids. She was previously on metoprolol but this medication had been discontinued during her GI illness.   Upon initial evaluation in the emergency department she was found to be in atrial fibrillation with rapid ventricular response rates in the 130s. Chest x-ray demonstrated mild interstitial pulmonary edema and she was subsequently given IV Lasix.  HPI/Subjective: Pt says she "feels great."  Denies cp, n/v, abdom pain, or sob.  States she is thirsty, and feels she is not urinating near as much as she usually does.    Assessment/Plan:  Atrial fibrillation with RVR Rate now controlled - unclear why anticoag was never initiated or if was ever considered in outpt setting (pt denies ever discussing) - CHA2DS2-VASc Score is 6, which correlates with a nearly 10% yearly risk of CVA - HAS-BLED Score 1-2, making risk of CVA signif higher than bleeding risk - pt agreeable to initiating anticoag  Acute kidney failure in chronic kidney disease baseline BUN/Cr 29/1.28 - crt currently climbing - hydrate in setting of recent GI illness and follow closely  Acute respiratory failure with hypoxia minimal edema on CXR - very little response to Lasix given by ER and admitting MD and suspect respiratory sx's more r/t RVR - now stable from resp standpoint with control of HR   Newly appreciated systolic CHF w/  acute exacerbation with chronic diastolic heart failure EF now 40-45% - previously 65-70% (2011) - could simply be related to recent RVR since no focal WMA - suggest f/u TTE in outpt setting after consistent control of HR - no ACE/ARB now due to acute kidney failure   Chronic obstructive pulmonary disease no wheezing - compensated - counseled on absolute need to quit smoking   Diabetes A1c 6.5 - CBG reasonably controlled   Hypertension Currently reasonably controlled   Known AAA Followed per Dr. Kellie Simmering - 5.6 cm June 2014 - at that time plan was to discuss resection and grafting, with plan for procedure in July 2014 - pt states she decided against surgery and would rather "just die from it if it is gonna take me" than to risk undergoing surgery - will re-image if crt stabilizes to determine if size has changed  Obesity - Body mass index is 36.7 kg/(m^2).  DVT prophylaxis: Lovenox Code Status: Full Family Communication: No family at bedside Disposition Plan: stable for transfer to tele bed - watch renal fxn - gently hydrate - begin PT/OT and assure rate controlled w/ activity   Consultants: None   Procedures: TTE - 5/29 - EF 40-45% - no WMA - moderate LVH - L&R atria mildly dilated - PA peak 35m Hg  Antibiotics: None  Objective: Blood pressure 131/81, pulse 78, temperature 98 F (36.7 C), temperature source Oral, resp. rate 18, height 5' 5.5" (1.664 m), weight 101.606 kg (224 lb), SpO2 97.00%.  Intake/Output Summary (Last 24 hours) at 12/06/13 1010 Last data filed at 12/05/13 2100  Gross per 24  hour  Intake    640 ml  Output      0 ml  Net    640 ml   Exam: General: No acute respiratory distress Lungs: Clear to auscultation bilaterally without wheezes or crackles Cardiovascular: Irregular rate and rhythm without murmur gallop or rub normal S1 and S2, no peripheral edema or JVD Abdomen: Nontender, nondistended, overweight, soft, bowel sounds positive, no rebound, no  ascites, no appreciable mass Musculoskeletal: No significant cyanosis, clubbing of bilateral lower extremities   Scheduled Meds:  Scheduled Meds: . allopurinol  300 mg Oral Daily  . aspirin EC  81 mg Oral Daily  . atorvastatin  10 mg Oral q1800  . budesonide-formoterol  2 puff Inhalation BID  . cyanocobalamin  500 mcg Oral Daily  . diltiazem  60 mg Oral 4 times per day  . enoxaparin (LOVENOX) injection  100 mg Subcutaneous Q24H  . fenofibrate  160 mg Oral Daily  . guaiFENesin  600 mg Oral BID  . insulin aspart  0-9 Units Subcutaneous TID WC  . magnesium oxide  200 mg Oral Daily  . metoprolol tartrate  50 mg Oral BID  . montelukast  10 mg Oral QHS  . multivitamin with minerals  1 tablet Oral Daily  . pantoprazole  40 mg Oral Daily  . vitamin C  500 mg Oral Daily   Data Reviewed: Basic Metabolic Panel:  Recent Labs Lab 12/04/13 1428 12/05/13 0059 12/06/13 0210  NA 136* 134* 131*  K 4.4 3.8 3.5*  CL 97 93* 92*  CO2 _0 GLUCOSE 109* 224* 113*  BUN 34* 36* 41*  CREATININE 1.71* 1.95* 2.47*  CALCIUM 9.3 8.9 8.9  MG 2.1  --   --    Liver Function Tests:  Recent Labs Lab 12/04/13 1428 12/05/13 0059  AST 26 22  ALT 21 18  ALKPHOS 69 64  BILITOT 0.7 0.8  PROT 7.2 6.9  ALBUMIN 3.4* 3.2*   CBC:  Recent Labs Lab 12/04/13 1428 12/05/13 0059 12/06/13 0210  WBC 7.8 9.3 9.5  NEUTROABS 5.4  --   --   HGB 14.4 13.5 13.1  HCT 41.2 39.6 37.7  MCV 94.5 95.0 93.8  PLT PLATELET CLUMPS NOTED ON SMEAR, COUNT APPEARS DECREASED 259 264   Cardiac Enzymes:  Recent Labs Lab 12/04/13 1842 12/05/13 0059 12/05/13 1000  TROPONINI <0.30 <0.30 <0.30   BNP (last 3 results)  Recent Labs  12/04/13 1428  PROBNP 10429.0*   CBG:  Recent Labs Lab 12/05/13 1222 12/05/13 1630 12/05/13 2121 12/06/13 0753  GLUCAP 103* 137* 186* 126*    Recent Results (from the past 240 hour(s))  MRSA PCR SCREENING     Status: None   Collection Time    12/04/13  6:43 PM       Result Value Ref Range Status   MRSA by PCR NEGATIVE  NEGATIVE Final   Comment:            The GeneXpert MRSA Assay (FDA     approved for NASAL specimens     only), is one component of a     comprehensive MRSA colonization     surveillance program. It is not     intended to diagnose MRSA     infection nor to guide or     monitor treatment for     MRSA infections.     Studies:  Recent x-ray studies have been reviewed in detail by the Attending Physician  Time spent:  17 mins  Cherene Altes, MD [facility] For Consults/Admissions - Flow Manager - [phone] Office  [phone] Pager [phone]  On-Call/Text Page:      [email or web address]      password [email or web address]  12/06/2013, 10:10 AM   LOS: 2 days

## 2013-12-06 NOTE — Evaluation (Addendum)
Occupational Therapy Evaluation Patient Details Name: Heather Stevenson MRN: [id number] DOB: 04-Sep-1929 Today's Date: 12/06/2013    History of Present Illness 80 year old female patient with chronic atrial fibrillation, pulmonary hypertension with COPD, chronic kidney disease as well as abdominal aortic aneurysm. Presented to the emergency department because of progressive shortness of breath and lower extremity edema.   Clinical Impression   Pt presents with below problem list. Pt independent with ADLs, PTA. Feel pt will benefit from acute OT to increase independence prior to d/c. Recommending HHOT upon d/c.     Follow Up Recommendations  Home health OT;Supervision/Assistance - 24 hour    Equipment Recommendations  3 in 1 bedside comode    Recommendations for Other Services       Precautions / Restrictions Restrictions Weight Bearing Restrictions: No      Mobility Bed Mobility               General bed mobility comments: not assessed  Transfers Overall transfer level: Needs assistance Equipment used: 4-wheeled walker Transfers: Sit to/from Stand Sit to Stand: Min guard;Supervision         General transfer comment: cues for hand placement.    Balance                                            ADL Overall ADL's : Needs assistance/impaired     Grooming: Supervision/safety;Standing               Lower Body Dressing: Sit to/from stand;Min guard   Toilet Transfer: Minimal assistance;Ambulation;Regular Toilet;Grab bars (ambulated without rollator)   Toileting- Clothing Manipulation and Hygiene: Supervision/safety;Sit to/from stand       Functional mobility during ADLs: Min guard;Minimal assistance (ambulated with and without rollator) General ADL Comments: Recommended sitting for most of bathing/dressing. Discussed energy conservation techniques. Pt appeared more stable with use of rollator versus without. OT educated on safety tips  (safe shoewear, rugs).  OT educated/demonstrated shower transfer technique and also eduated on use of 3 in 1. Advised to stop smoking.     Vision                     Perception     Praxis      Pertinent Vitals/Pain No pain reported.      Hand Dominance     Extremity/Trunk Assessment Upper Extremity Assessment Upper Extremity Assessment: Overall WFL for tasks assessed   Lower Extremity Assessment Lower Extremity Assessment: Defer to PT evaluation       Communication Communication Communication: No difficulties   Cognition Arousal/Alertness: Awake/alert Behavior During Therapy: WFL for tasks assessed/performed Overall Cognitive Status: Within Functional Limits for tasks assessed                     General Comments       Exercises       Shoulder Instructions      Home Living Family/patient expects to be discharged to:: Private residence Living Arrangements: Alone Available Help at Discharge: Family Type of Home: Apartment Home Access: Elevator           Bathroom Shower/Tub: Hospital doctor Toilet: Standard Bathroom Accessibility: Yes How Accessible: Accessible via walker Home Equipment: Walker - 4 wheels;Shower seat - built in          Prior Functioning/Environment Level of Independence: Independent with  assistive device(s)        Comments: used rollator in community    OT Diagnosis: Generalized weakness   OT Problem List: Decreased strength;Decreased activity tolerance;Impaired balance (sitting and/or standing);Decreased knowledge of use of DME or AE;Decreased knowledge of precautions;Pain;Cardiopulmonary status limiting activity   OT Treatment/Interventions: Self-care/ADL training;Energy conservation;Therapeutic exercise;DME and/or AE instruction;Therapeutic activities;Patient/family education;Balance training    OT Goals(Current goals can be found in the care plan section) Acute Rehab OT Goals Patient Stated Goal:  not stated OT Goal Formulation: With patient Time For Goal Achievement: 12/13/13 Potential to Achieve Goals: Good ADL Goals Pt Will Perform Upper Body Bathing: with modified independence;sitting Pt Will Perform Lower Body Bathing: with modified independence;sit to/from stand Pt Will Perform Lower Body Dressing: with modified independence;sit to/from stand Pt Will Transfer to Toilet: with modified independence;ambulating Pt Will Perform Tub/Shower Transfer: Shower transfer;with modified independence;ambulating;3 in 1;rolling walker Additional ADL Goal #1: Pt will independently verbalize and demonstrate 3/3 energy conservation techniques.  OT Frequency: Min 2X/week   Barriers to D/C:            Co-evaluation              End of Session Equipment Utilized During Treatment: Gait belt;Other (comment) (rollator; O2 placed on at end of session) Nurse Communication: Mobility status  Activity Tolerance: Patient tolerated treatment well Patient left: in chair;with call bell/phone within reach;with family/visitor present   Time: 0569-7948 OT Time Calculation (min): 28 min Charges:  OT General Charges $OT Visit: 1 Procedure OT Evaluation $Initial OT Evaluation Tier I: 1 Procedure OT Treatments $Self Care/Home Management : 8-22 mins G-Codes:    Benito Mccreedy OTR/L [phone] 12/06/2013, 5:55 PM

## 2013-12-06 NOTE — Progress Notes (Signed)
ANTICOAGULATION CONSULT NOTE - Initial Consult  Pharmacy Consult for Lovenox Indication: atrial fibrillation  Allergies  Allergen Reactions  . Hctz [Hydrochlorothiazide]   . Sulfa Antibiotics   . Tetracyclines & Related     Patient Measurements: Height: 5' 5.5" (166.4 cm) Weight: 224 lb (101.606 kg) IBW/kg (Calculated) : 58.15  Vital Signs: Temp: 97.6 F (36.4 C) (05/30 1149) Temp src: Oral (05/30 1149) BP: 126/92 mmHg (05/30 1149) Pulse Rate: 82 (05/30 1149)  Labs:  Recent Labs  12/04/13 1428 12/04/13 1842 12/05/13 0059 12/05/13 1000 12/06/13 0210  HGB 14.4  --  13.5  --  13.1  HCT 41.2  --  39.6  --  37.7  PLT PLATELET CLUMPS NOTED ON SMEAR, COUNT APPEARS DECREASED  --  259  --  264  CREATININE 1.71*  --  1.95*  --  2.47*  TROPONINI  --  <0.30 <0.30 <0.30  --     Estimated Creatinine Clearance: 21 ml/min (by C-G formula based on Cr of 2.47).   Medical History: Past Medical History  Diagnosis Date  . Hyperlipidemia   . Gout   . History of small bowel obstruction   . COPD (chronic obstructive pulmonary disease)   . Substance abuse     Tobacco  . Depression   . Pulmonary hypertension   . Umbilical hernia   . Bronchitis   . Atrial fibrillation   . High cholesterol   . Pneumonia   . Arthritis   . Abdominal aortic aneurysm     followed by Dr Kellie Simmering- 4.8 cm per [place] 6/12- EPIC  . Complication of anesthesia     slow to wake up per pt- anesthesia record on chart  . Hypertension   . Anxiety   . Atherosclerosis of aorta     Medications:  Scheduled:  . allopurinol  300 mg Oral Daily  . aspirin EC  81 mg Oral Daily  . atorvastatin  10 mg Oral q1800  . budesonide-formoterol  2 puff Inhalation BID  . cyanocobalamin  500 mcg Oral Daily  . diltiazem  60 mg Oral 4 times per day  . enoxaparin (LOVENOX) injection  100 mg Subcutaneous Q24H  . fenofibrate  160 mg Oral Daily  . guaiFENesin  600 mg Oral BID  . insulin aspart  0-9 Units Subcutaneous TID WC  .  magnesium oxide  200 mg Oral Daily  . metoprolol tartrate  50 mg Oral BID  . montelukast  10 mg Oral QHS  . multivitamin with minerals  1 tablet Oral Daily  . pantoprazole  40 mg Oral Daily  . vitamin C  500 mg Oral Daily   Infusions:  . sodium chloride 50 mL/hr at 12/06/13 1156    Assessment: 80 yo F presenting on 5/28 with dehydration and fatigue. Noted to be in afib which is a chronic problem, unclear why patient not already on chronic anticoagulation. With CHADS-VASc of 5, patient started on full dose lovenox renally dose adjusted. Renal function continues to decline, SCr 1.95>2.47 (CrCl ~21 ml/min). H/H and plt wnl and stable with no reported s/s bleeding.   Goal of Therapy:  Prevention of thromboembolic events Monitor platelets by anticoagulation protocol: Yes   Plan:  - Continue Lovenox 100 mg SQ q24h - Monitor renal function, weight, CBC, s/s bleeding - F/u plans for transition to long-term PO anticoagulant  Harolyn Rutherford, PharmD Clinical Pharmacist - Resident Pager: [phone] Pharmacy: [phone] 12/06/2013 12:17 PM

## 2013-12-07 DIAGNOSIS — J9600 Acute respiratory failure, unspecified whether with hypoxia or hypercapnia: Secondary | ICD-10-CM

## 2013-12-07 LAB — COMPREHENSIVE METABOLIC PANEL
ALBUMIN: 3 g/dL — AB (ref 3.5–5.2)
ALK PHOS: 59 U/L (ref 39–117)
ALT: 23 U/L (ref 0–35)
AST: 30 U/L (ref 0–37)
BUN: 43 mg/dL — ABNORMAL HIGH (ref 6–23)
CO2: 25 mEq/L (ref 19–32)
Calcium: 8.8 mg/dL (ref 8.4–10.5)
Chloride: 96 mEq/L (ref 96–112)
Creatinine, Ser: 2.15 mg/dL — ABNORMAL HIGH (ref 0.50–1.10)
GFR calc Af Amer: 23 mL/min — ABNORMAL LOW (ref >90)
GFR calc non Af Amer: 20 mL/min — ABNORMAL LOW (ref >90)
Glucose, Bld: 118 mg/dL — ABNORMAL HIGH (ref 70–99)
POTASSIUM: 3.5 meq/L — AB (ref 3.7–5.3)
SODIUM: 137 meq/L (ref 137–147)
TOTAL PROTEIN: 6.3 g/dL (ref 6.0–8.3)
Total Bilirubin: 0.7 mg/dL (ref 0.3–1.2)

## 2013-12-07 LAB — CBC
HEMATOCRIT: 36.8 % (ref 36.0–46.0)
Hemoglobin: 12.4 g/dL (ref 12.0–15.0)
MCH: 31.6 pg (ref 26.0–34.0)
MCHC: 33.7 g/dL (ref 30.0–36.0)
MCV: 93.9 fL (ref 78.0–100.0)
PLATELETS: 250 10*3/uL (ref 150–400)
RBC: 3.92 MIL/uL (ref 3.87–5.11)
RDW: 14.1 % (ref 11.5–15.5)
WBC: 6.8 10*3/uL (ref 4.0–10.5)

## 2013-12-07 LAB — GLUCOSE, CAPILLARY
Glucose-Capillary: 119 mg/dL — ABNORMAL HIGH (ref 70–99)
Glucose-Capillary: 123 mg/dL — ABNORMAL HIGH (ref 70–99)
Glucose-Capillary: 128 mg/dL — ABNORMAL HIGH (ref 70–99)
Glucose-Capillary: 127 mg/dL — ABNORMAL HIGH (ref 70–99)

## 2013-12-07 LAB — MAGNESIUM: Magnesium: 2.2 mg/dL (ref 1.5–2.5)

## 2013-12-07 LAB — PROTIME-INR
INR: 1.19 (ref 0.00–1.49)
Prothrombin Time: 14.8 seconds (ref 11.6–15.2)

## 2013-12-07 MED ORDER — WARFARIN SODIUM 5 MG PO TABS
5.0000 mg | ORAL_TABLET | Freq: Once | ORAL | Status: AC
  Administered 2013-12-07: 5 mg via ORAL
  Filled 2013-12-07: qty 1

## 2013-12-07 MED ORDER — METOPROLOL SUCCINATE ER 100 MG PO TB24
100.0000 mg | ORAL_TABLET | Freq: Every day | ORAL | Status: DC
  Administered 2013-12-08 – 2013-12-09 (×2): 100 mg via ORAL
  Filled 2013-12-07 (×2): qty 1

## 2013-12-07 MED ORDER — COUMADIN BOOK
Freq: Once | Status: AC
  Administered 2013-12-07: 18:00:00
  Filled 2013-12-07: qty 1

## 2013-12-07 MED ORDER — WARFARIN VIDEO: Freq: Once | Status: DC

## 2013-12-07 MED ORDER — DILTIAZEM HCL ER COATED BEADS 240 MG PO CP24
240.0000 mg | ORAL_CAPSULE | Freq: Every day | ORAL | Status: DC
  Administered 2013-12-08 – 2013-12-09 (×2): 240 mg via ORAL
  Filled 2013-12-07 (×2): qty 1

## 2013-12-07 MED ORDER — WARFARIN - PHARMACIST DOSING INPATIENT: Freq: Every day | Status: DC

## 2013-12-07 NOTE — Evaluation (Signed)
Physical Therapy Evaluation and Discharge Patient Details Name: Heather Stevenson MRN: [id number] DOB: 04/17/1930 Today's Date: 12/07/2013   History of Present Illness  80 year old female patient with chronic atrial fibrillation, pulmonary hypertension with COPD, chronic kidney disease as well as abdominal aortic aneurysm. Presented to the emergency department because of progressive shortness of breath and lower extremity edema.  Clinical Impression   Pt presents at/near baseline functional mobility but still has limitations with muscle endurance walking longer distances (has long hallway at home).  Educated on simple exercises and pt agreeable to HHPT for continued PT following d/c.  Daughter states [place] (patient's apartment) has an agency ([place] something? She's not sure of the name) and wants RNCM to call her to set up HHPT (phone [phone]).  No further acute PT needs, will d/c services. Thank you.    Follow Up Recommendations Home health PT;Supervision - Intermittent    Equipment Recommendations  None recommended by PT    Recommendations for Other Services       Precautions / Restrictions Precautions Precautions: Fall      Mobility  Bed Mobility                  Transfers Overall transfer level: Modified independent Equipment used: 4-wheeled walker Transfers: Sit to/from Stand Sit to Stand: Supervision            Ambulation/Gait Ambulation/Gait assistance: Supervision Ambulation Distance (Feet): 100 Feet Assistive device: 4-wheeled walker Gait Pattern/deviations: Step-through pattern;Trunk flexed Gait velocity: decr Gait velocity interpretation: Below normal speed for age/gender General Gait Details: dependent on RW and fatigues, so sits on rollator seat.    Stairs            Wheelchair Mobility    Modified Rankin (Stroke Patients Only)       Balance Overall balance assessment: No apparent balance deficits (not formally assessed)                                            Pertinent Vitals/Pain no apparent distress     Home Living Family/patient expects to be discharged to:: Private residence Living Arrangements: Alone Available Help at Discharge: Family Type of Home: Apartment Home Access: Elevator     Home Layout: One level Home Equipment: Environmental consultant - 4 wheels;Shower seat - built in      Prior Function Level of Independence: Independent with assistive device(s)         Comments: used rollator in [employer]        Extremity/Trunk Assessment   Upper Extremity Assessment: Defer to OT evaluation           Lower Extremity Assessment: Overall WFL for tasks assessed (calves fatigue with walking.  Instructed stretches)         Communication   Communication: No difficulties  Cognition Arousal/Alertness: Awake/alert Behavior During Therapy: WFL for tasks assessed/performed Overall Cognitive Status: Within Functional Limits for tasks assessed                      General Comments      Exercises Other Exercises Other Exercises: Instructed on walking intervals of 1/4 length of hallway x 4-5 bouts throughout day, standing supported calf stretching, and stand strong/sit slow to improve leg strength.      Assessment/Plan    PT Assessment All further PT needs can  be met in the next venue of care  PT Diagnosis Difficulty walking   PT Problem List Cardiopulmonary status limiting activity;Decreased mobility;Decreased activity tolerance  PT Treatment Interventions     PT Goals (Current goals can be found in the Care Plan section) Acute Rehab PT Goals Patient Stated Goal: go home and not need anybody's help PT Goal Formulation: No goals set, d/c therapy    Frequency     Barriers to discharge        Co-evaluation               End of Session Equipment Utilized During Treatment: Gait belt Activity Tolerance: Patient tolerated treatment  well Patient left: in chair;with call bell/phone within reach;with family/visitor present Nurse Communication: Mobility status         Time: 1530-1600 PT Time Calculation (min): 30 min   Charges:   PT Evaluation $Initial PT Evaluation Tier I: 1 Procedure PT Treatments $Therapeutic Exercise: 8-22 mins $Therapeutic Activity: 8-22 mins   PT G Codes:          Herbie Drape 12/07/2013, 5:32 PM

## 2013-12-07 NOTE — Progress Notes (Signed)
ANTICOAGULATION CONSULT NOTE - Initial Consult  Pharmacy Consult for Lovenox and Coumadin Indication: atrial fibrillation  Allergies  Allergen Reactions  . Hctz [Hydrochlorothiazide]   . Sulfa Antibiotics   . Tetracyclines & Related     Patient Measurements: Height: 5' 5.5" (166.4 cm) Weight: 223 lb 3.2 oz (101.243 kg) IBW/kg (Calculated) : 58.15  Vital Signs: Temp: 98.3 F (36.8 C) (05/31 1400) Temp src: Oral (05/31 1400) BP: 125/96 mmHg (05/31 1400) Pulse Rate: 78 (05/31 1400)  Labs:  Recent Labs  12/04/13 1842  12/05/13 0059 12/05/13 1000 12/06/13 0210 12/07/13 0515 12/07/13 1339  HGB  --   < > 13.5  --  13.1 12.4  --   HCT  --   --  39.6  --  37.7 36.8  --   PLT  --   --  259  --  264 250  --   LABPROT  --   --   --   --   --   --  14.8  INR  --   --   --   --   --   --  1.19  CREATININE  --   --  1.95*  --  2.47* 2.15*  --   TROPONINI <0.30  --  <0.30 <0.30  --   --   --   < > = values in this interval not displayed.  Estimated Creatinine Clearance: 24 ml/min (by C-G formula based on Cr of 2.15).   Medical History: Past Medical History  Diagnosis Date  . Hyperlipidemia   . Gout   . History of small bowel obstruction   . COPD (chronic obstructive pulmonary disease)   . Substance abuse     Tobacco  . Depression   . Pulmonary hypertension   . Umbilical hernia   . Bronchitis   . Atrial fibrillation   . High cholesterol   . Pneumonia   . Arthritis   . Abdominal aortic aneurysm     followed by Dr Kellie Simmering- 4.8 cm per [place] 6/12- EPIC  . Complication of anesthesia     slow to wake up per pt- anesthesia record on chart  . Hypertension   . Anxiety   . Atherosclerosis of aorta     Medications:  Scheduled:  . allopurinol  300 mg Oral Daily  . aspirin EC  81 mg Oral Daily  . atorvastatin  10 mg Oral q1800  . budesonide-formoterol  2 puff Inhalation BID  . cyanocobalamin  500 mcg Oral Daily  . [START ON 12/08/2013] diltiazem  240 mg Oral Daily  .  diltiazem  60 mg Oral 4 times per day  . enoxaparin (LOVENOX) injection  100 mg Subcutaneous Q24H  . fenofibrate  160 mg Oral Daily  . guaiFENesin  600 mg Oral BID  . insulin aspart  0-9 Units Subcutaneous TID WC  . magnesium oxide  200 mg Oral Daily  . metoprolol tartrate  50 mg Oral BID  . [START ON 12/08/2013] metoprolol succinate  100 mg Oral Daily  . montelukast  10 mg Oral QHS  . multivitamin with minerals  1 tablet Oral Daily  . off the beat book   Does not apply Once  . pantoprazole  40 mg Oral Daily  . vitamin C  500 mg Oral Daily   Infusions:     Assessment: Received orders to start coumadin for afib in this  80 yo female who is receiving Lovenox SQ 148m q12h for afib.   Noted  with h/o afib as a chronic problem, unclear why patient was not already on chronic anticoagulation prior to admission.  With CHADS-VASc of 5, patient was started on full dose lovenox n 5/29,  renally dose adjusted. Scr improved some today, = 2.15 down from 2..47 <1.95. Estimated CrCl ~24 ml/min. H/H and plt wnl and stable with no reported s/s bleeding. Baseline INR today = 1.19.  Start with low dose coumadin due to age >57.  Weight =101kg, LFTs wnl, Albumin 3.0 ( >2.5)   Goal of Therapy:  Prevention of thromboembolic events Monitor platelets by anticoagulation protocol: Yes INR = 2-3   Plan:  -Give coumadin 56m po x 1 - Continue Lovenox 100 mg SQ q24h - Daily PT/INR - Monitor renal function, weight, CBC, s/s bleeding - F/u plans for transition to long-term PO anticoagulant  RNicole Cella RPh Clinical Pharmacist Pager: [phone] 03325/31/2015 2:53 PM

## 2013-12-07 NOTE — Progress Notes (Signed)
,           PROGRESS NOTE  Heather Stevenson XLK:[id number] DOB: 25-May-1930 DOA: 12/04/2013 PCP: Alesia Richards, MD  Assessment/Plan: Atrial fibrillation with RVR  -Rate now controlled -CHA2DS2-VASc Score is 6, which correlates with a nearly 10% yearly risk of CVA - -HAS-BLED Score 1-2, making risk of CVA signif higher than bleeding risk - pt agreeable to initiating anticoag  -As the patient has CKD stage IV, factor Xa antagonists, start warfarin with Lovenox bridge Acute kidney failure in chronic kidney disease  baseline BUN/Cr 29/1.28 - crt currently climbing - -serum creatinine showed some improvement and plateau on 12/07/2013 -Elevated serum creatinine secondary to aggressive diuresis Acute respiratory failure with hypoxia  -12/04/2013 chest x-ray --mild interstitial edema/prominence  -Presently stable on room air  Acute systolic CHF -EF now 36-64% - previously 65-70% (2011) - could simply be related to recent RVR since no focal WMA - suggest f/u TTE in outpt setting after consistent control of HR - no ACE/ARB now due to acute kidney failure  -Convert to metoprolol succinate in the morning- Chronic obstructive pulmonary disease  no wheezing - compensated - counseled on absolute need to quit smoking  -Patient has over 60 pack year history  Diabetes type II, newly diagnosed -A1c 6.5 - CBG reasonably controlled  -Given the patient's age and current recommendations, I would allow for more liberal glycemic control  -Lifestyle modification  Hypertension  -Currently reasonably controlled  Known AAA  Followed per Dr. Kellie Simmering - 5.6 cm June 2014 - at that time plan was to discuss resection and grafting, with plan for procedure in July 2014 - pt states she decided against surgery and would rather "just die from it if it is gonna take me" than to risk undergoing surgery - will re-image if crt stabilizes to determine if size has changed  Obesity - Body mass index is 36.7  kg/(m^2).  Family Communication:   Updated daughter on phone Disposition Plan:   Home when medically stable        Procedures/Studies: Dg Chest 2 View  11/09/2013   CLINICAL DATA:  Productive cough.  Hypertension.  EXAM: CHEST  2 VIEW  COMPARISON:  04/07/2013  FINDINGS: Cardiac silhouette is normal in size. The aorta is tortuous. No mediastinal or hilar masses.  Lungs are hyperexpanded. There is mild pleural parenchymal scarring at the apices. No lung consolidation or edema. No pleural effusion or pneumothorax.  Bony thorax is diffusely demineralized but grossly intact.  IMPRESSION: No acute cardiopulmonary disease.   Electronically Signed   By: Lajean Manes M.D.   On: 11/09/2013 17:18   Dg Chest Port 1 View  12/04/2013   CLINICAL DATA:  Rales, pleural effusion  EXAM: PORTABLE CHEST - 1 VIEW  COMPARISON:  11/28/2013  FINDINGS: Cardiomediastinal silhouette is stable. S-shaped thoracic scoliosis again noted. Atherosclerotic calcifications of thoracic aorta. Mild interstitial prominence bilaterally without convincing pulmonary edema. Streaky bilateral basilar atelectasis or scarring.  IMPRESSION: Mild interstitial prominence bilaterally without convincing pulmonary edema. Streaky bilateral basilar atelectasis or scarring.   Electronically Signed   By: Lahoma Crocker M.D.   On: 12/04/2013 14:17   Dg Chest Portable 1 View  11/28/2013   CLINICAL DATA:  Dehydration.  EXAM: PORTABLE CHEST - 1 VIEW  COMPARISON:  11/09/2013  FINDINGS: Cardiac silhouette is normal in size. The aorta is tortuous. No mediastinal or hilar masses. There is diffuse vascular prominence which is stable. Apical pleural parenchymal scarring is stable. No lung consolidation. No overt  edema. No pleural effusion or pneumothorax.  Bony thorax is demineralized but grossly intact.  IMPRESSION: No acute cardiopulmonary disease.   Electronically Signed   By: Lajean Manes M.D.   On: 11/28/2013 21:06         Subjective: Patient denies  fevers, chills, headache, chest pain, dyspnea, nausea, vomiting, diarrhea, abdominal pain, dysuria, hematuria. Leg edema has improved   Objective: Filed Vitals:   12/06/13 2123 12/06/13 2332 12/07/13 0500 12/07/13 0633  BP:  112/78 100/56 134/97  Pulse: 102 84 77 92  Temp:   98.3 F (36.8 C)   TempSrc:      Resp:   18   Height:      Weight:   101.243 kg (223 lb 3.2 oz)   SpO2:   93%     Intake/Output Summary (Last 24 hours) at 12/07/13 1300 Last data filed at 12/07/13 0500  Gross per 24 hour  Intake 1333.33 ml  Output      0 ml  Net 1333.33 ml   Weight change: -0.363 kg (-12.8 oz) Exam:   General:  Pt is alert, follows commands appropriately, not in acute distress  HEENT: No icterus, No thrush, No neck mass, [place]/AT  Cardiovascular: RRR, S1/S2, no rubs, no gallops  Respiratory: CTA bilaterally, no wheezing, no crackles, no rhonchi  Abdomen: Soft/+BS, non tender, non distended, no guarding  Extremities: trace LE edema, No lymphangitis, No petechiae, No rashes, no synovitis  Data Reviewed: Basic Metabolic Panel:  Recent Labs Lab 12/04/13 1428 12/05/13 0059 12/06/13 0210 12/07/13 0515  NA 136* 134* 131* 137  K 4.4 3.8 3.5* 3.5*  CL 97 93* 92* 96  CO2 _0 GLUCOSE 109* 224* 113* 118*  BUN 34* 36* 41* 43*  CREATININE 1.71* 1.95* 2.47* 2.15*  CALCIUM 9.3 8.9 8.9 8.8  MG 2.1  --   --  2.2   Liver Function Tests:  Recent Labs Lab 12/04/13 1428 12/05/13 0059 12/07/13 0515  AST _1 ALT _2 ALKPHOS 69 64 59  BILITOT 0.7 0.8 0.7  PROT 7.2 6.9 6.3  ALBUMIN 3.4* 3.2* 3.0*   No results found for this basename: LIPASE, AMYLASE,  in the last 168 hours No results found for this basename: AMMONIA,  in the last 168 hours CBC:  Recent Labs Lab 12/04/13 1428 12/05/13 0059 12/06/13 0210 12/07/13 0515  WBC 7.8 9.3 9.5 6.8  NEUTROABS 5.4  --   --   --   HGB 14.4 13.5 13.1 12.4  HCT 41.2 39.6 37.7 36.8  MCV 94.5 95.0 93.8 93.9  PLT  PLATELET CLUMPS NOTED ON SMEAR, COUNT APPEARS DECREASED 259 264 250   Cardiac Enzymes:  Recent Labs Lab 12/04/13 1842 12/05/13 0059 12/05/13 1000  TROPONINI <0.30 <0.30 <0.30   BNP: No components found with this basename: POCBNP,  CBG:  Recent Labs Lab 12/06/13 1150 12/06/13 1628 12/06/13 2015 12/07/13 0721 12/07/13 1130  GLUCAP 89 117* 180* 119* 123*    Recent Results (from the past 240 hour(s))  MRSA PCR SCREENING     Status: None   Collection Time    12/04/13  6:43 PM      Result Value Ref Range Status   MRSA by PCR NEGATIVE  NEGATIVE Final   Comment:            The GeneXpert MRSA Assay (FDA     approved for NASAL specimens     only), is one component of a  comprehensive MRSA colonization     surveillance program. It is not     intended to diagnose MRSA     infection nor to guide or     monitor treatment for     MRSA infections.     Scheduled Meds: . allopurinol  300 mg Oral Daily  . aspirin EC  81 mg Oral Daily  . atorvastatin  10 mg Oral q1800  . budesonide-formoterol  2 puff Inhalation BID  . cyanocobalamin  500 mcg Oral Daily  . diltiazem  60 mg Oral 4 times per day  . enoxaparin (LOVENOX) injection  100 mg Subcutaneous Q24H  . fenofibrate  160 mg Oral Daily  . guaiFENesin  600 mg Oral BID  . insulin aspart  0-9 Units Subcutaneous TID WC  . magnesium oxide  200 mg Oral Daily  . metoprolol tartrate  50 mg Oral BID  . montelukast  10 mg Oral QHS  . multivitamin with minerals  1 tablet Oral Daily  . off the beat book   Does not apply Once  . pantoprazole  40 mg Oral Daily  . vitamin C  500 mg Oral Daily   Continuous Infusions:    Geraldo Docker  [facility] Pager [phone]  If 7PM-7AM, please contact night-coverage [email or web address] Password [email or web address] 12/07/2013, 1:00 PM   LOS: 3 days

## 2013-12-08 ENCOUNTER — Encounter: Payer: Self-pay | Admitting: Internal Medicine

## 2013-12-08 LAB — BASIC METABOLIC PANEL
BUN: 44 mg/dL — ABNORMAL HIGH (ref 6–23)
CO2: 28 mEq/L (ref 19–32)
Calcium: 9 mg/dL (ref 8.4–10.5)
Chloride: 95 mEq/L — ABNORMAL LOW (ref 96–112)
Creatinine, Ser: 2.34 mg/dL — ABNORMAL HIGH (ref 0.50–1.10)
GFR calc Af Amer: 21 mL/min — ABNORMAL LOW (ref >90)
GFR calc non Af Amer: 18 mL/min — ABNORMAL LOW (ref >90)
Glucose, Bld: 153 mg/dL — ABNORMAL HIGH (ref 70–99)
POTASSIUM: 4.1 meq/L (ref 3.7–5.3)
SODIUM: 136 meq/L — AB (ref 137–147)

## 2013-12-08 LAB — GLUCOSE, CAPILLARY
Glucose-Capillary: 107 mg/dL — ABNORMAL HIGH (ref 70–99)
Glucose-Capillary: 93 mg/dL (ref 70–99)

## 2013-12-08 LAB — PROTIME-INR
INR: 1.11 (ref 0.00–1.49)
Prothrombin Time: 14.1 seconds (ref 11.6–15.2)

## 2013-12-08 MED ORDER — WARFARIN SODIUM 5 MG PO TABS
5.0000 mg | ORAL_TABLET | Freq: Once | ORAL | Status: AC
  Administered 2013-12-08: 5 mg via ORAL
  Filled 2013-12-08: qty 1

## 2013-12-08 NOTE — Progress Notes (Signed)
Occupational Therapy Treatment Patient Details Name: Heather Stevenson MRN: [id number] DOB: [birth year] Today's Date: 12/08/2013    History of present illness 80 year old female patient with chronic atrial fibrillation, pulmonary hypertension with COPD, chronic kidney disease as well as abdominal aortic aneurysm. Presented to the emergency department because of progressive shortness of breath and lower extremity edema.   OT comments  Pt educated on energy conservation and home safety strategies. Overall she is doing well and appears close to baseline. She will benefit from continued OT while on acute and post acute followup also.   Follow Up Recommendations  Home health OT;Supervision - Intermittent    Equipment Recommendations  None recommended by OT (pt wants to let [facility] assess for 3in1 to see if it will fit in her space)    Recommendations for Other Services      Precautions / Restrictions Precautions Precautions: Fall Restrictions Weight Bearing Restrictions: No       Mobility Bed Mobility Overal bed mobility: Modified Independent                Transfers Overall transfer level: Modified independent Equipment used: 4-wheeled walker Transfers: Sit to/from Stand                Balance                                   ADL                                   Tub/ Shower Transfer: Supervision/safety;Walk-in shower;Grab bars Tub/Shower Transfer Details (indicate cue type and reason): pt had slight LOB taking a step toward shower stall. Able to step into and out of shower with grab bar with supervision.   General ADL Comments: Pt declined performing sponge bath this am despite encouragement. She states she didnt sleep well last night and is tired right now. Provided extensive education on toilet transfers and DME as well as shower DME and energy conservation. Pt states her toilet is low at home and she has difficulty transferring on and off  of it. She only has a pedistal sink beside toilet and is not sure if space would allow for a 3in1. She would like a shower chair also to help conserve energy for showering. Explained OT can request DMe for d/c but pt states she would like for HHOT to assess for both DME to make sure it fits in her space. Provided energy conservation handout and reviewed information with pt. She verbalized understanding of information and reports information is helpful. Discussed AE also for LB self care to make task easier. Pt reports having non skid strips in shower. Made recommendations for clear paths in her apt to allow for manuevering 4 wheel walker, etc. She had intermittant episode of forgetfulness. She wanted to find her walker and it was right in front of her in the room. She also required increased time to verbalize "oxygen tube."      Vision                     Perception     Praxis      Cognition   Behavior During Therapy: [facility] for tasks assessed/performed Overall Cognitive Status: Within Functional Limits for tasks assessed (intermittant noted some forgetfulness. see ADL notes)  Extremity/Trunk Assessment               Exercises     Shoulder Instructions       General Comments      Pertinent Vitals/ Pain       No complaint of  Home Living                                          Prior Functioning/Environment              Frequency Min 2X/week     Progress Toward Goals  OT Goals(current goals can now be found in the care plan section)  Progress towards OT goals: Progressing toward goals     Plan Discharge plan remains appropriate    Co-evaluation                 End of Session Equipment Utilized During Treatment: Rolling walker (4 wheeled walker)   Activity Tolerance Patient tolerated treatment well   Patient Left in chair (pt wanting to sit in chair near bed. feel pt ok to transfer over to EOB)    Nurse Communication          Time: 0900-0930 OT Time Calculation (min): 30 min  Charges: OT General Charges $OT Visit: 1 Procedure OT Treatments $Self Care/Home Management : 8-22 mins $Therapeutic Activity: 8-22 mins  [place] [phone] 12/08/2013, 9:39 AM

## 2013-12-08 NOTE — Progress Notes (Signed)
PROGRESS NOTE  Heather Stevenson OTR:[id number] DOB: 12/05/1929 DOA: 12/04/2013 PCP: Alesia Richards, MD  Assessment/Plan: Atrial fibrillation with RVR  -Rate now controlled  -CHA2DS2-VASc Score is 6, which correlates with a nearly 10% yearly risk of CVA - -HAS-BLED Score 1-2, making risk of CVA signif higher than bleeding risk - pt agreeable to initiating anticoag  -As the patient has CKD stage IV, factor Xa antagonists, start warfarin with Lovenox bridge  Acute kidney failure in chronic kidney disease  baseline BUN/Cr 29/1.28 - crt currently climbing -  -serum creatinine showed some improvement and plateau on 12/07/2013  -Elevated serum creatinine secondary to aggressive diuresis  Acute respiratory failure with hypoxia  -12/04/2013 chest x-ray --mild interstitial edema/prominence  -Presently stable on room air  Acute systolic CHF  -EF now 83-33% - previously 65-70% (2011) - could simply be related to recent RVR since no focal WMA - suggest f/u TTE in outpt setting after consistent control of HR - no ACE/ARB now due to acute kidney failure  -Converted to metoprolol succinate  Chronic obstructive pulmonary disease  -no wheezing - compensated - counseled on absolute need to quit smoking  -Patient has over 60 pack year history  Diabetes type II, newly diagnosed  -A1c 6.5 - CBG reasonably controlled  -Given the patient's age and current recommendations, I would allow for more liberal glycemic control  -Lifestyle modification  -d/c CBGs Hypertension  -Currently controlled  Known AAA  Followed per Dr. Kellie Simmering - 5.6 cm June 2014 - at that time plan was to discuss resection and grafting, with plan for procedure in July 2014 - pt states she decided against surgery and would rather "just die from it if it is gonna take me" than to risk undergoing surgery - will re-image if crt stabilizes to determine if size has changed  Obesity - Body mass index is 36.7 kg/(m^2).  Family  Communication: Updated daughter on phone  Disposition Plan: Home when INR trending up        Procedures/Studies: Dg Chest 2 View  11/09/2013   CLINICAL DATA:  Productive cough.  Hypertension.  EXAM: CHEST  2 VIEW  COMPARISON:  04/07/2013  FINDINGS: Cardiac silhouette is normal in size. The aorta is tortuous. No mediastinal or hilar masses.  Lungs are hyperexpanded. There is mild pleural parenchymal scarring at the apices. No lung consolidation or edema. No pleural effusion or pneumothorax.  Bony thorax is diffusely demineralized but grossly intact.  IMPRESSION: No acute cardiopulmonary disease.   Electronically Signed   By: Lajean Manes M.D.   On: 11/09/2013 17:18   Dg Chest Port 1 View  12/04/2013   CLINICAL DATA:  Rales, pleural effusion  EXAM: PORTABLE CHEST - 1 VIEW  COMPARISON:  11/28/2013  FINDINGS: Cardiomediastinal silhouette is stable. S-shaped thoracic scoliosis again noted. Atherosclerotic calcifications of thoracic aorta. Mild interstitial prominence bilaterally without convincing pulmonary edema. Streaky bilateral basilar atelectasis or scarring.  IMPRESSION: Mild interstitial prominence bilaterally without convincing pulmonary edema. Streaky bilateral basilar atelectasis or scarring.   Electronically Signed   By: Lahoma Crocker M.D.   On: 12/04/2013 14:17   Dg Chest Portable 1 View  11/28/2013   CLINICAL DATA:  Dehydration.  EXAM: PORTABLE CHEST - 1 VIEW  COMPARISON:  11/09/2013  FINDINGS: Cardiac silhouette is normal in size. The aorta is tortuous. No mediastinal or hilar masses. There is diffuse vascular prominence which is stable. Apical pleural parenchymal scarring is stable. No lung consolidation.  No overt edema. No pleural effusion or pneumothorax.  Bony thorax is demineralized but grossly intact.  IMPRESSION: No acute cardiopulmonary disease.   Electronically Signed   By: Lajean Manes M.D.   On: 11/28/2013 21:06         Subjective: Patient denies fevers, chills, headache,  chest pain, dyspnea, nausea, vomiting, diarrhea, abdominal pain, dysuria, hematuria   Objective: Filed Vitals:   12/07/13 2358 12/08/13 0500 12/08/13 0900 12/08/13 1407  BP: 121/71 107/71  125/76  Pulse: 102 71  64  Temp:  98 F (36.7 C)  97.8 F (36.6 C)  TempSrc:    Oral  Resp:  18    Height:      Weight:  102.331 kg (225 lb 9.6 oz)    SpO2:  97% 95% 98%    Intake/Output Summary (Last 24 hours) at 12/08/13 1855 Last data filed at 12/08/13 1700  Gross per 24 hour  Intake   1440 ml  Output      0 ml  Net   1440 ml   Weight change: 1.089 kg (2 lb 6.4 oz) Exam:   General:  Pt is alert, follows commands appropriately, not in acute distress  HEENT: No icterus, No thrush,  [place]/AT  Cardiovascular: IRRR, S1/S2, no rubs, no gallops  Respiratory: Fine bibasilar crackles. No wheezing. Good air movement.  Abdomen: Soft/+BS, non tender, non distended, no guarding  Extremities: 1+ LE edema, No lymphangitis, No petechiae, No rashes, no synovitis  Data Reviewed: Basic Metabolic Panel:  Recent Labs Lab 12/04/13 1428 12/05/13 0059 12/06/13 0210 12/07/13 0515 12/08/13 0535  NA 136* 134* 131* 137 136*  K 4.4 3.8 3.5* 3.5* 4.1  CL 97 93* 92* 96 95*  CO2 _0 GLUCOSE 109* 224* 113* 118* 153*  BUN 34* 36* 41* 43* 44*  CREATININE 1.71* 1.95* 2.47* 2.15* 2.34*  CALCIUM 9.3 8.9 8.9 8.8 9.0  MG 2.1  --   --  2.2  --    Liver Function Tests:  Recent Labs Lab 12/04/13 1428 12/05/13 0059 12/07/13 0515  AST _1 ALT _2 ALKPHOS 69 64 59  BILITOT 0.7 0.8 0.7  PROT 7.2 6.9 6.3  ALBUMIN 3.4* 3.2* 3.0*   No results found for this basename: LIPASE, AMYLASE,  in the last 168 hours No results found for this basename: AMMONIA,  in the last 168 hours CBC:  Recent Labs Lab 12/04/13 1428 12/05/13 0059 12/06/13 0210 12/07/13 0515  WBC 7.8 9.3 9.5 6.8  NEUTROABS 5.4  --   --   --   HGB 14.4 13.5 13.1 12.4  HCT 41.2 39.6 37.7 36.8  MCV 94.5 95.0 93.8  93.9  PLT PLATELET CLUMPS NOTED ON SMEAR, COUNT APPEARS DECREASED 259 264 250   Cardiac Enzymes:  Recent Labs Lab 12/04/13 1842 12/05/13 0059 12/05/13 1000  TROPONINI <0.30 <0.30 <0.30   BNP: No components found with this basename: POCBNP,  CBG:  Recent Labs Lab 12/07/13 1130 12/07/13 1651 12/07/13 1956 12/08/13 0731 12/08/13 1655  GLUCAP 123* 128* 127* 107* 93    Recent Results (from the past 240 hour(s))  MRSA PCR SCREENING     Status: None   Collection Time    12/04/13  6:43 PM      Result Value Ref Range Status   MRSA by PCR NEGATIVE  NEGATIVE Final   Comment:            The GeneXpert MRSA Assay (FDA  approved for NASAL specimens     only), is one component of a     comprehensive MRSA colonization     surveillance program. It is not     intended to diagnose MRSA     infection nor to guide or     monitor treatment for     MRSA infections.     Scheduled Meds: . allopurinol  300 mg Oral Daily  . aspirin EC  81 mg Oral Daily  . atorvastatin  10 mg Oral q1800  . budesonide-formoterol  2 puff Inhalation BID  . cyanocobalamin  500 mcg Oral Daily  . diltiazem  240 mg Oral Daily  . enoxaparin (LOVENOX) injection  100 mg Subcutaneous Q24H  . fenofibrate  160 mg Oral Daily  . guaiFENesin  600 mg Oral BID  . magnesium oxide  200 mg Oral Daily  . metoprolol succinate  100 mg Oral Daily  . montelukast  10 mg Oral QHS  . multivitamin with minerals  1 tablet Oral Daily  . pantoprazole  40 mg Oral Daily  . vitamin C  500 mg Oral Daily  . warfarin   Does not apply Once  . Warfarin - Pharmacist Dosing Inpatient   Does not apply q1800   Continuous Infusions:    Geraldo Docker  [facility] Pager [phone]  If 7PM-7AM, please contact night-coverage [email or web address] Password [email or web address] 12/08/2013, 6:55 PM   LOS: 4 days

## 2013-12-08 NOTE — Progress Notes (Signed)
ANTICOAGULATION CONSULT NOTE - Follow Up Consult  Pharmacy Consult for Coumadin,Lovenox Indication: atrial fibrillation  Allergies  Allergen Reactions  . Hctz [Hydrochlorothiazide]   . Sulfa Antibiotics   . Tetracyclines & Related     Patient Measurements: Height: 5' 5.5" (166.4 cm) Weight: 225 lb 9.6 oz (102.331 kg) IBW/kg (Calculated) : 58.15 Heparin Dosing Weight:   Vital Signs: Temp: 98 F (36.7 C) (06/01 0500) BP: 107/71 mmHg (06/01 0500) Pulse Rate: 71 (06/01 0500)  Labs:  Recent Labs  12/06/13 0210 12/07/13 0515 12/07/13 1339 12/08/13 0535 12/08/13 0930  HGB 13.1 12.4  --   --   --   HCT 37.7 36.8  --   --   --   PLT 264 250  --   --   --   LABPROT  --   --  14.8  --  14.1  INR  --   --  1.19  --  1.11  CREATININE 2.47* 2.15*  --  2.34*  --     Estimated Creatinine Clearance: 22.2 ml/min (by C-G formula based on Cr of 2.34).   Medications:  Scheduled:  . allopurinol  300 mg Oral Daily  . aspirin EC  81 mg Oral Daily  . atorvastatin  10 mg Oral q1800  . budesonide-formoterol  2 puff Inhalation BID  . cyanocobalamin  500 mcg Oral Daily  . diltiazem  240 mg Oral Daily  . enoxaparin (LOVENOX) injection  100 mg Subcutaneous Q24H  . fenofibrate  160 mg Oral Daily  . guaiFENesin  600 mg Oral BID  . insulin aspart  0-9 Units Subcutaneous TID WC  . magnesium oxide  200 mg Oral Daily  . metoprolol succinate  100 mg Oral Daily  . montelukast  10 mg Oral QHS  . multivitamin with minerals  1 tablet Oral Daily  . pantoprazole  40 mg Oral Daily  . vitamin C  500 mg Oral Daily  . warfarin   Does not apply Once  . Warfarin - Pharmacist Dosing Inpatient   Does not apply q1800    Assessment: 80yo female on Lovenox and Coumadin for AFib.  INR this AM 1.11 after first dose yesterday.  No CBC today, no signs of bleeding.  Cr 2.34; Lovenox dose is adjusted for renal function.    Goal of Therapy:  INR 2-3 Monitor platelets by anticoagulation protocol: Yes    Plan:  1-  Continue Lovenox 158m SQ q24 2-  Repeat Coumadin [id number] 3-  Daily INR  KeGracy BruinsPharmD Clinical Pharmacist CoArlington Hospital

## 2013-12-08 NOTE — Discharge Instructions (Signed)
Information on my medicine - Coumadin   (Warfarin)  This medication education was reviewed with me or my healthcare representative as part of my discharge preparation.  The pharmacist that spoke with me during my hospital stay was:  Jaquita Folds, [facility]  Why was Coumadin prescribed for you? Coumadin was prescribed for you because you have a blood clot or a medical condition that can cause an increased risk of forming blood clots. Blood clots can cause serious health problems by blocking the flow of blood to the heart, lung, or brain. Coumadin can prevent harmful blood clots from forming. As a reminder your indication for Coumadin is:   Stroke Prevention Because Of Atrial Fibrillation  What test will check on my response to Coumadin? While on Coumadin (warfarin) you will need to have an INR test regularly to ensure that your dose is keeping you in the desired range. The INR (international normalized ratio) number is calculated from the result of the laboratory test called prothrombin time (PT).  If an INR APPOINTMENT HAS NOT ALREADY BEEN MADE FOR YOU please schedule an appointment to have this lab work done by your health care provider within 7 days. Your INR goal is usually a number between:  2 to 3 or your provider may give you a more narrow range like 2-2.5.  Ask your health care provider during an office visit what your goal INR is.  What  do you need to  know  About  COUMADIN? Take Coumadin (warfarin) exactly as prescribed by your healthcare provider about the same time each day.  DO NOT stop taking without talking to the doctor who prescribed the medication.  Stopping without other blood clot prevention medication to take the place of Coumadin may increase your risk of developing a new clot or stroke.  Get refills before you run out.  What do you do if you miss a dose? If you miss a dose, take it as soon as you remember on the same day then continue your regularly scheduled regimen the next  day.  Do not take two doses of Coumadin at the same time.  Important Safety Information A possible side effect of Coumadin (Warfarin) is an increased risk of bleeding. You should call your healthcare provider right away if you experience any of the following:   Bleeding from an injury or your nose that does not stop.   Unusual colored urine (red or dark brown) or unusual colored stools (red or black).   Unusual bruising for unknown reasons.   A serious fall or if you hit your head (even if there is no bleeding).  Some foods or medicines interact with Coumadin (warfarin) and might alter your response to warfarin. To help avoid this:   Eat a balanced diet, maintaining a consistent amount of Vitamin K.   Notify your provider about major diet changes you plan to make.   Avoid alcohol or limit your intake to 1 drink for women and 2 drinks for men per day. (1 drink is 5 oz. wine, 12 oz. beer, or 1.5 oz. liquor.)  Make sure that ANY health care provider who prescribes medication for you knows that you are taking Coumadin (warfarin).  Also make sure the healthcare provider who is monitoring your Coumadin knows when you have started a new medication including herbals and non-prescription products.  Coumadin (Warfarin)  Major Drug Interactions  Increased Warfarin Effect Decreased Warfarin Effect  Alcohol (large quantities) Antibiotics (esp. Septra/Bactrim, Flagyl, Cipro) Amiodarone (Cordarone) Aspirin (  ASA) Cimetidine (Tagamet) Megestrol (Megace) NSAIDs (ibuprofen, naproxen, etc.) Piroxicam (Feldene) Propafenone (Rythmol SR) Propranolol (Inderal) Isoniazid (INH) Posaconazole (Noxafil) Barbiturates (Phenobarbital) Carbamazepine (Tegretol) Chlordiazepoxide (Librium) Cholestyramine (Questran) Griseofulvin Oral Contraceptives Rifampin Sucralfate (Carafate) Vitamin K   Coumadin (Warfarin) Major Herbal Interactions  Increased Warfarin Effect Decreased Warfarin Effect   Garlic Ginseng Ginkgo biloba Coenzyme Q10 Green tea St. Johns wort    Coumadin (Warfarin) FOOD Interactions  Eat a consistent number of servings per week of foods HIGH in Vitamin K (1 serving =  cup)  Collards (cooked, or boiled & drained) Kale (cooked, or boiled & drained) Mustard greens (cooked, or boiled & drained) Parsley *serving size only =  cup Spinach (cooked, or boiled & drained) Swiss chard (cooked, or boiled & drained) Turnip greens (cooked, or boiled & drained)  Eat a consistent number of servings per week of foods MEDIUM-HIGH in Vitamin K (1 serving = 1 cup)  Asparagus (cooked, or boiled & drained) Broccoli (cooked, boiled & drained, or raw & chopped) Brussel sprouts (cooked, or boiled & drained) *serving size only =  cup Lettuce, raw (green leaf, endive, romaine) Spinach, raw Turnip greens, raw & chopped   These websites have more information on Coumadin (warfarin):  FailFactory.se; VeganReport.com.au;

## 2013-12-08 NOTE — Progress Notes (Signed)
Additional copy Medicare IM given to pt and signed copy placed in shadow chart- signed 12/08/13- 1545

## 2013-12-09 DIAGNOSIS — I714 Abdominal aortic aneurysm, without rupture, unspecified: Secondary | ICD-10-CM

## 2013-12-09 LAB — GLUCOSE, CAPILLARY
GLUCOSE-CAPILLARY: 82 mg/dL (ref 70–99)
Glucose-Capillary: 139 mg/dL — ABNORMAL HIGH (ref 70–99)
Glucose-Capillary: 113 mg/dL — ABNORMAL HIGH (ref 70–99)

## 2013-12-09 LAB — BASIC METABOLIC PANEL
BUN: 40 mg/dL — AB (ref 6–23)
CO2: 26 meq/L (ref 19–32)
CREATININE: 1.9 mg/dL — AB (ref 0.50–1.10)
Calcium: 9 mg/dL (ref 8.4–10.5)
Chloride: 97 mEq/L (ref 96–112)
GFR calc Af Amer: 27 mL/min — ABNORMAL LOW (ref >90)
GFR calc non Af Amer: 23 mL/min — ABNORMAL LOW (ref >90)
GLUCOSE: 139 mg/dL — AB (ref 70–99)
Potassium: 3.7 mEq/L (ref 3.7–5.3)
SODIUM: 137 meq/L (ref 137–147)

## 2013-12-09 LAB — PROTIME-INR
INR: 1.21 (ref 0.00–1.49)
Prothrombin Time: 15 seconds (ref 11.6–15.2)

## 2013-12-09 MED ORDER — ENOXAPARIN SODIUM 100 MG/ML ~~LOC~~ SOLN: 100.0000 mg | SUBCUTANEOUS | Status: DC

## 2013-12-09 MED ORDER — FUROSEMIDE 20 MG PO TABS: 20.0000 mg | ORAL_TABLET | Freq: Every day | ORAL | Status: DC

## 2013-12-09 MED ORDER — METOPROLOL SUCCINATE ER 100 MG PO TB24: 100.0000 mg | ORAL_TABLET | Freq: Every day | ORAL | Status: DC

## 2013-12-09 MED ORDER — WARFARIN SODIUM 5 MG PO TABS: 5.0000 mg | ORAL_TABLET | Freq: Every day | ORAL | Status: DC

## 2013-12-09 MED ORDER — DILTIAZEM HCL ER COATED BEADS 240 MG PO CP24: 240.0000 mg | ORAL_CAPSULE | Freq: Every day | ORAL | Status: DC

## 2013-12-09 NOTE — Progress Notes (Signed)
Spoke with patient about her Hgb A1c about better food choices and exercise. Explained what a HgbA1c was and what is measures. She mentions that she has never been diagnosed with DM but two of her children have been diagnosed with DM. Pt explained that her daughter has been doing really well with her diet and that they were planning on cooking meals together for their whole week. I told her that was a good start.  Also spoke with patient last night about coumadin and vitamin K. We went over the coumadin booklet. Pt recognized broccoli on her tray last night and did not eat it. I told her she did well. Pt asked for a dietician consult for nutritional information while on coumadin. Consult put in.

## 2013-12-09 NOTE — Progress Notes (Signed)
UR Completed Obediah Welles Graves-Bigelow, RN,BSN [phone]  

## 2013-12-09 NOTE — Progress Notes (Signed)
Nutrition Consult  Received nutrition consult for education regarding Coumadin and vitamin K intake. Provided patient with a list of foods and their vitamin K content. Encouraged a consistent intake of vitamin K while taking Coumadin. Patient receptive.  Molli Barrows, RD, LDN, [place] Pager [phone] After Hours Pager [phone]

## 2013-12-09 NOTE — Discharge Summary (Signed)
Physician Discharge Summary  Heather Stevenson OAC:[id number] DOB: [birth year] DOA: 12/04/2013  PCP: Alesia Richards, MD  Admit date: 12/04/2013 Discharge date: 12/09/2013  Recommendations for Outpatient Follow-up:  1. Pt will need to follow up with PCP in 1 weeks post discharge 2. Please obtain BMP 12/11/13 3. Please also check CBC 12/11/13 4. Please check INR on 12/11/13 and adjust coumadin accordingly--Home Health will draw and send results to Dr. Melford Aase 5. Continue Lovenox 136m [place] daily until INR is 2-3  Discharge Diagnoses:  Atrial fibrillation with RVR  -Rate now controlled  -CHA2DS2-VASc Score is 6, which correlates with a nearly 10% yearly risk of CVA - -HAS-BLED Score 1-2, making risk of CVA signif higher than bleeding risk - pt agreeable to initiating anticoag  -As the patient has CKD stage IV, factor Xa antagonists, start warfarin with Lovenox bridge  -Patient will go home with warfarin 5 mg daily with Lovenox bridge -Patient is comfortable injecting herself with Lovenox -INR will be drawn 12/11/2013 by home health service with results sent to the patient's primary care provider to adjust Coumadin dosing -Rate controlled on Cardizem CD 240 mg daily Acute kidney failure in chronic kidney disease  -Baseline creatinine 1.5-1.7 -serum creatinine showed some improvement and plateau on 12/07/2013  -Elevated serum creatinine secondary to aggressive diuresis  -Serum creatinine 1.90 the date of discharge Acute respiratory failure with hypoxia  -12/04/2013 chest x-ray --mild interstitial edema/prominence  -Presently stable on room air  Acute systolic CHF  -EF now 401-09%- previously 65-70% (2011) - could simply be related to recent RVR since no focal WMA - suggest f/u TTE in outpt setting after consistent control of HR - no ACE/ARB now due to acute kidney failure  -Converted to metoprolol succinate  -The patient will restart furosemide 20 mg po daily beginning 12/10/2013 -BMP will be  requested on 12/11/2013 with results and the patient's primary care provider  -Discharge "dry weight"--101.4 kg  Chronic obstructive pulmonary disease  -no wheezing - compensated - counseled on absolute need to quit smoking  -Patient has over 60 pack year history  Diabetes type II, newly diagnosed  -A1c 6.5 - CBG reasonably controlled  -Given the patient's age and current recommendations, I would allow for more liberal glycemic control  -Lifestyle modification  -d/c CBGs  Hypertension  -Currently controlled  Known AAA  Followed per Dr. LKellie Simmering- 5.6 cm June 2014 - at that time plan was to discuss resection and grafting, with plan for procedure in July 2014 - pt states she decided against surgery and would rather "just die from it if it is gonna take me" than to risk undergoing surgery - will re-image if crt stabilizes to determine if size has changed  Obesity - Body mass index is 36.7 kg/(m^2).  Deconditioning  -The patient was up with home health PT and OT  -Home health will also assist with drawing the pt's INR Family Communication: Updated daughter on phone    Discharge Condition: Stable  Disposition:  Follow-up Information   Follow up with MAlesia Richards MD In 1 week.   Specialty:  Internal Medicine   Contact information:   [address] [phone]    Home  Diet: Heart healthy  Wt Readings from Last 3 Encounters:  12/09/13 101.4 kg (223 lb 8.7 oz)  12/04/13 104.327 kg (230 lb)  11/28/13 90.719 kg (200 lb)    History of present illness:   80yo F w/ chronic atrial fibrillation,  pulmonary hypertension with COPD, chronic kidney disease, as well as an abdominal aortic aneurysm who presented to the ED w/ progressive shortness of breath and lower extremity edema. Endorsed 3 weeks prior treated as an outpatient for possible bronchitis or pneumonia and was prescribed antibiotics by her PCP. 6 days prior to presentation she  developed nausea and vomiting and poor intake and was given IV fluids. She was previously on metoprolol but this medication had been discontinued during her GI illness.  Upon initial evaluation in the emergency department she was found to be in atrial fibrillation with rapid ventricular response rates in the 130s. Chest x-ray demonstrated mild interstitial pulmonary edema and she was subsequently given IV Lasix. Her BNP was 10,429. The patient showed good clinical response and was subsequently weaned off of oxygen. Her circumflex branch and an increase from diuresis. As a result, her furosemide was held. Her creatinine stabilized. She will be restarted on furosemide Naprosyn sedentary the patient was discharged in stable condition on room air.   Discharge Exam: Filed Vitals:   12/09/13 0500  BP: 128/87  Pulse: 65  Temp: 98.3 F (36.8 C)  Resp: 18   Filed Vitals:   12/08/13 1407 12/08/13 2003 12/08/13 2048 12/09/13 0500  BP: 125/76 136/98  128/87  Pulse: 64 86  65  Temp: 97.8 F (36.6 C) 98.1 F (36.7 C)  98.3 F (36.8 C)  TempSrc: Oral Oral  Oral  Resp:  18  18  Height:      Weight:    101.4 kg (223 lb 8.7 oz)  SpO2: 98% 90% 92% 96%   General: A&O x 3, NAD, pleasant, cooperative Cardiovascular: IRRR, no rub, no gallop, no S3 Respiratory: CTAB, no wheeze, no rhonchi Abdomen:soft, nontender, nondistended, positive bowel sounds Extremities: trace LE edema, No lymphangitis, no petechiae  Discharge Instructions      Discharge Instructions   Diet - low sodium heart healthy    Complete by:  As directed      Discharge instructions    Complete by:  As directed   Please make appt to see Dr. Gaspar Skeeters for 12/15/13     Increase activity slowly    Complete by:  As directed             Medication List    STOP taking these medications       aspirin EC 325 MG tablet     metoprolol tartrate 25 MG tablet  Commonly known as:  LOPRESSOR      TAKE these medications        allopurinol 300 MG tablet  Commonly known as:  ZYLOPRIM  Take 300 mg by mouth daily.     BREO ELLIPTA 100-25 MCG/INH Aepb  Generic drug:  Fluticasone Furoate-Vilanterol  Inhale 1 puff into the lungs daily.     CENTRUM tablet  Take 1 tablet by mouth daily.     CVS VIT D 5000 HIGH-POTENCY PO  Take 5,000 Units by mouth daily.     cyanocobalamin 500 MCG tablet  Take 500 mcg by mouth daily.     DEXILANT 60 MG capsule  Generic drug:  dexlansoprazole  Take 60 mg by mouth daily.     diltiazem 240 MG 24 hr capsule  Commonly known as:  CARDIZEM CD  Take 1 capsule (240 mg total) by mouth daily.     enoxaparin 100 MG/ML injection  Commonly known as:  LOVENOX  Inject 1 mL (100 mg total) into the skin daily.  fenofibrate micronized 134 MG capsule  Commonly known as:  LOFIBRA  Take 134 mg by mouth daily before breakfast.     furosemide 20 MG tablet  Commonly known as:  LASIX  Take 1 tablet (20 mg total) by mouth daily. Start 12/10/13  Start taking on:  12/10/2013     Magnesium 250 MG Tabs  Take 250 mg by mouth daily.     metoprolol succinate 100 MG 24 hr tablet  Commonly known as:  TOPROL-XL  Take 1 tablet (100 mg total) by mouth daily. Take with or immediately following a meal.     montelukast 10 MG tablet  Commonly known as:  SINGULAIR  Take 10 mg by mouth at bedtime.     ondansetron 4 MG disintegrating tablet  Commonly known as:  ZOFRAN-ODT  Take 4 mg by mouth every 6 (six) hours as needed for nausea or vomiting.     PROAIR HFA 108 (90 BASE) MCG/ACT inhaler  Generic drug:  albuterol  Inhale 2 puffs into the lungs every 6 (six) hours as needed for shortness of breath.     rosuvastatin 5 MG tablet  Commonly known as:  CRESTOR  Take 5 mg by mouth at bedtime.     vitamin C 500 MG tablet  Commonly known as:  ASCORBIC ACID  Take 500 mg by mouth daily.     warfarin 5 MG tablet  Commonly known as:  COUMADIN  Take 1 tablet (5 mg total) by mouth daily.         The  results of significant diagnostics from this hospitalization (including imaging, microbiology, ancillary and laboratory) are listed below for reference.    Significant Diagnostic Studies: Dg Chest 2 View  11/09/2013   CLINICAL DATA:  Productive cough.  Hypertension.  EXAM: CHEST  2 VIEW  COMPARISON:  04/07/2013  FINDINGS: Cardiac silhouette is normal in size. The aorta is tortuous. No mediastinal or hilar masses.  Lungs are hyperexpanded. There is mild pleural parenchymal scarring at the apices. No lung consolidation or edema. No pleural effusion or pneumothorax.  Bony thorax is diffusely demineralized but grossly intact.  IMPRESSION: No acute cardiopulmonary disease.   Electronically Signed   By: Lajean Manes M.D.   On: 11/09/2013 17:18   Dg Chest Port 1 View  12/04/2013   CLINICAL DATA:  Rales, pleural effusion  EXAM: PORTABLE CHEST - 1 VIEW  COMPARISON:  11/28/2013  FINDINGS: Cardiomediastinal silhouette is stable. S-shaped thoracic scoliosis again noted. Atherosclerotic calcifications of thoracic aorta. Mild interstitial prominence bilaterally without convincing pulmonary edema. Streaky bilateral basilar atelectasis or scarring.  IMPRESSION: Mild interstitial prominence bilaterally without convincing pulmonary edema. Streaky bilateral basilar atelectasis or scarring.   Electronically Signed   By: Lahoma Crocker M.D.   On: 12/04/2013 14:17   Dg Chest Portable 1 View  11/28/2013   CLINICAL DATA:  Dehydration.  EXAM: PORTABLE CHEST - 1 VIEW  COMPARISON:  11/09/2013  FINDINGS: Cardiac silhouette is normal in size. The aorta is tortuous. No mediastinal or hilar masses. There is diffuse vascular prominence which is stable. Apical pleural parenchymal scarring is stable. No lung consolidation. No overt edema. No pleural effusion or pneumothorax.  Bony thorax is demineralized but grossly intact.  IMPRESSION: No acute cardiopulmonary disease.   Electronically Signed   By: Lajean Manes M.D.   On: 11/28/2013 21:06      Microbiology: Recent Results (from the past 240 hour(s))  MRSA PCR SCREENING     Status: None   Collection  Time    12/04/13  6:43 PM      Result Value Ref Range Status   MRSA by PCR NEGATIVE  NEGATIVE Final   Comment:            The GeneXpert MRSA Assay (FDA     approved for NASAL specimens     only), is one component of a     comprehensive MRSA colonization     surveillance program. It is not     intended to diagnose MRSA     infection nor to guide or     monitor treatment for     MRSA infections.     Labs: Basic Metabolic Panel:  Recent Labs Lab 12/04/13 1428 12/05/13 0059 12/06/13 0210 12/07/13 0515 12/08/13 0535 12/09/13 0500  NA 136* 134* 131* 137 136* 137  K 4.4 3.8 3.5* 3.5* 4.1 3.7  CL 97 93* 92* 96 95* 97  CO2 _0 GLUCOSE 109* 224* 113* 118* 153* 139*  BUN 34* 36* 41* 43* 44* 40*  CREATININE 1.71* 1.95* 2.47* 2.15* 2.34* 1.90*  CALCIUM 9.3 8.9 8.9 8.8 9.0 9.0  MG 2.1  --   --  2.2  --   --    Liver Function Tests:  Recent Labs Lab 12/04/13 1428 12/05/13 0059 12/07/13 0515  AST _1 ALT _2 ALKPHOS 69 64 59  BILITOT 0.7 0.8 0.7  PROT 7.2 6.9 6.3  ALBUMIN 3.4* 3.2* 3.0*   No results found for this basename: LIPASE, AMYLASE,  in the last 168 hours No results found for this basename: AMMONIA,  in the last 168 hours CBC:  Recent Labs Lab 12/04/13 1428 12/05/13 0059 12/06/13 0210 12/07/13 0515  WBC 7.8 9.3 9.5 6.8  NEUTROABS 5.4  --   --   --   HGB 14.4 13.5 13.1 12.4  HCT 41.2 39.6 37.7 36.8  MCV 94.5 95.0 93.8 93.9  PLT PLATELET CLUMPS NOTED ON SMEAR, COUNT APPEARS DECREASED 259 264 250   Cardiac Enzymes:  Recent Labs Lab 12/04/13 1842 12/05/13 0059 12/05/13 1000  TROPONINI <0.30 <0.30 <0.30   BNP: No components found with this basename: POCBNP,  CBG:  Recent Labs Lab 12/08/13 0731 12/08/13 1156 12/08/13 1655 12/08/13 2000 12/09/13 0741  GLUCAP 107* 82 93 139* 113*    Time coordinating  discharge:  Greater than 30 minutes  Signed:  Orson Eva, DO [facility] Pager: [phone] 12/09/2013, 10:37 AM

## 2013-12-09 NOTE — Progress Notes (Signed)
ANTICOAGULATION CONSULT NOTE - Follow Up Consult  Pharmacy Consult for Coumadin Indication: atrial fibrillation  Allergies  Allergen Reactions  . Hctz [Hydrochlorothiazide]   . Sulfa Antibiotics   . Tetracyclines & Related     Patient Measurements: Height: 5' 5.5" (166.4 cm) Weight: 223 lb 8.7 oz (101.4 kg) IBW/kg (Calculated) : 58.15 Heparin Dosing Weight:   Vital Signs: Temp: 98.3 F (36.8 C) (06/02 0500) Temp src: Oral (06/02 0500) BP: 128/87 mmHg (06/02 0500) Pulse Rate: 65 (06/02 0500)  Labs:  Recent Labs  12/07/13 0515 12/07/13 1339 12/08/13 0535 12/08/13 0930 12/09/13 0500  HGB 12.4  --   --   --   --   HCT 36.8  --   --   --   --   PLT 250  --   --   --   --   LABPROT  --  14.8  --  14.1 15.0  INR  --  1.19  --  1.11 1.21  CREATININE 2.15*  --  2.34*  --  1.90*    Estimated Creatinine Clearance: 27.2 ml/min (by C-G formula based on Cr of 1.9).   Medications:  Scheduled:  . allopurinol  300 mg Oral Daily  . aspirin EC  81 mg Oral Daily  . atorvastatin  10 mg Oral q1800  . budesonide-formoterol  2 puff Inhalation BID  . cyanocobalamin  500 mcg Oral Daily  . diltiazem  240 mg Oral Daily  . enoxaparin (LOVENOX) injection  100 mg Subcutaneous Q24H  . fenofibrate  160 mg Oral Daily  . guaiFENesin  600 mg Oral BID  . magnesium oxide  200 mg Oral Daily  . metoprolol succinate  100 mg Oral Daily  . montelukast  10 mg Oral QHS  . multivitamin with minerals  1 tablet Oral Daily  . pantoprazole  40 mg Oral Daily  . vitamin C  500 mg Oral Daily  . warfarin   Does not apply Once  . Warfarin - Pharmacist Dosing Inpatient   Does not apply q1800    Assessment: 80yo female with AFib, on day 3 of Coumadin with renally adjusted Lovenox bridge.  INR 1.21 this AM, no CBC, no bleeding noted.  Expected d/c home today.  Goal of Therapy:  INR 2-3 Monitor platelets by anticoagulation protocol: Yes   Plan:  1-  Repeat Coumadin 62m 2-  Recommend Coumadin 535m daily & next INR 6/4   KeGracy BruinsPharmD [facility]

## 2013-12-11 DIAGNOSIS — I5021 Acute systolic (congestive) heart failure: Secondary | ICD-10-CM

## 2013-12-11 DIAGNOSIS — J441 Chronic obstructive pulmonary disease with (acute) exacerbation: Secondary | ICD-10-CM

## 2013-12-11 DIAGNOSIS — I4891 Unspecified atrial fibrillation: Secondary | ICD-10-CM

## 2013-12-11 DIAGNOSIS — I509 Heart failure, unspecified: Secondary | ICD-10-CM

## 2013-12-16 ENCOUNTER — Ambulatory Visit (INDEPENDENT_AMBULATORY_CARE_PROVIDER_SITE_OTHER): Payer: Medicare Other | Admitting: Internal Medicine

## 2013-12-16 ENCOUNTER — Encounter: Payer: Self-pay | Admitting: Internal Medicine

## 2013-12-16 VITALS — BP 130/90 | HR 76 | Temp 97.7°F | Resp 16 | Ht 65.5 in | Wt 232.0 lb

## 2013-12-16 DIAGNOSIS — I4891 Unspecified atrial fibrillation: Secondary | ICD-10-CM

## 2013-12-16 DIAGNOSIS — Z7901 Long term (current) use of anticoagulants: Secondary | ICD-10-CM

## 2013-12-16 DIAGNOSIS — R0989 Other specified symptoms and signs involving the circulatory and respiratory systems: Secondary | ICD-10-CM

## 2013-12-16 DIAGNOSIS — I509 Heart failure, unspecified: Secondary | ICD-10-CM

## 2013-12-16 DIAGNOSIS — Z79899 Other long term (current) drug therapy: Secondary | ICD-10-CM

## 2013-12-16 DIAGNOSIS — R0609 Other forms of dyspnea: Secondary | ICD-10-CM

## 2013-12-16 LAB — CBC WITH DIFFERENTIAL/PLATELET
Basophils Absolute: 0.1 10*3/uL (ref 0.0–0.1)
Basophils Relative: 1 % (ref 0–1)
EOS ABS: 0.3 10*3/uL (ref 0.0–0.7)
EOS PCT: 4 % (ref 0–5)
HCT: 37.1 % (ref 36.0–46.0)
Hemoglobin: 12.7 g/dL (ref 12.0–15.0)
Lymphocytes Relative: 19 % (ref 12–46)
Lymphs Abs: 1.3 10*3/uL (ref 0.7–4.0)
MCH: 31.7 pg (ref 26.0–34.0)
MCHC: 34.2 g/dL (ref 30.0–36.0)
MCV: 92.5 fL (ref 78.0–100.0)
Monocytes Absolute: 0.6 10*3/uL (ref 0.1–1.0)
Monocytes Relative: 8 % (ref 3–12)
Neutro Abs: 4.8 10*3/uL (ref 1.7–7.7)
Neutrophils Relative %: 68 % (ref 43–77)
PLATELETS: 329 10*3/uL (ref 150–400)
RBC: 4.01 MIL/uL (ref 3.87–5.11)
RDW: 14.8 % (ref 11.5–15.5)
WBC: 7.1 10*3/uL (ref 4.0–10.5)

## 2013-12-16 NOTE — Patient Instructions (Signed)
Try 2 tabs Lasix 20 mg = 40 mg daily for fluid     3 to 4 Gram Sodium Diet, No Added Salt (NAS) A 3 to 4 gram sodium diet restricts the amount of sodium in the diet to no more than 3 to 4 g or 3000 to 4000 mg daily. Limiting the amount of sodium is often used to help lower blood pressure. It is important if you have heart, liver, or kidney problems. Many foods contain sodium for flavor and sometimes as a preservative. When the amount of sodium in a diet needs to be low, it is important to know what to look for when choosing foods and drinks. The following includes some information and guidelines to help make it easier for you to adapt to a low sodium diet. QUICK TIPS  Do not add salt to food.  Avoid convenience items and fast food.  Choose unsalted snack foods.  Buy lower sodium products, often labeled as "lower sodium" or "no salt added."  Check food labels to learn how much sodium is in 1 serving.  When eating at a restaurant, ask that your food be prepared with less salt or none, if possible. READING FOOD LABELS FOR SODIUM INFORMATION The nutrition facts label is a good place to find how much sodium is in foods. Look for products with no more than 500 to 600 mg of sodium per meal and no more than 150 mg per serving. Remember that 3 to 4 g = 3000 to 4000 mg. The food label may also list foods as:  Sodium-free: Less than 5 mg in a serving.  Very low sodium: 35 mg or less in a serving.  Low-sodium: 140 mg or less in a serving.  Light in sodium: 50% less sodium in a serving. For example, if a food that usually has 300 mg of sodium is changed to become light in sodium, it will have 150 mg of sodium.  Reduced sodium: 25% less sodium in a serving. For example, if a food that usually has 400 mg of sodium is changed to reduced sodium, it will have 300 mg of sodium. CHOOSING FOODS Grains  Avoid: Salted crackers and snack items. Bread stuffing and biscuit mixes. Seasoned rice or pasta  mixes.  Choose: Unsalted snack items. English muffins, breads, and rolls. Homemade pancakes and waffles. Most cereals. Pasta. Meats  Avoid: Salted, canned, smoked, spiced, pickled meats, including fish and poultry. Bacon, ham, sausage, cold cuts, hot dogs, anchovies.  Choose: Low-sodium canned tuna and salmon. Fresh or frozen meat, poultry, and fish. Dairy  Avoid: Processed cheese and spreads. Cottage cheese. Buttermilk and condensed milk. Regular cheese.  Choose: Milk. Low-sodium cottage cheese. Yogurt. Sour cream. Low-sodium cheese. Fruits and Vegetables  Avoid: Regular canned vegetables. Regular canned tomato sauce and paste. Frozen vegetables in sauces. Olives. Heather Stevenson. Relishes. Sauerkraut.  Choose: Low-sodium canned vegetables. Low-sodium tomato sauce and paste. Frozen or fresh vegetables. Fresh and frozen fruit. Condiments  Avoid: Canned and packaged gravies. Worcestershire sauce. Tartar sauce. Barbecue sauce. Soy sauce. Steak sauce. Ketchup. Onion, garlic, and table salt. Meat flavorings and tenderizers.  Choose: Fresh and dried herbs and spices. Low-sodium varieties of mustard and ketchup. Lemon juice. Tabasco sauce. Horseradish. SAMPLE 3 TO 4 GRAM SODIUM MEAL PLAN  Breakfast / Sodium (mg)  1 cup low-fat milk / 275 mg  2 slices whole-wheat toast / 270 mg  1 tbs heart-healthy margarine / 153 mg  1 hard-boiled egg / 139 mg Lunch / Sodium (mg)  1 cup raw carrots / 76 mg   cup hummus / 298 mg  1 cup low-fat milk / 143 mg   cup red grapes / 2 mg  1 cup low-sodium chicken and rice soup / 480 mg  10 low-sodium saltine crackers / 191 mg Dinner / Sodium (mg)  1 cup whole-wheat pasta / 2 mg  1 cup tomato sauce / 1178 mg  3 oz lean ground beef / 57 mg  1 small side salad (1 cup raw spinach leaves,  cup cucumber,  cup yellow bell pepper) / 25 mg  1 tsp ranch dressing / 144 mg Snack / Sodium (mg)  1 slice cheddar cheese / 258 mg  1 medium apple / 1  mg Nutrient Analysis  Calories: 2005  Protein: 85 g  Carbohydrate: 245 g  Fat: 78 g  Sodium: 3560 mg Document Released: 06/26/2005 Document Revised: 09/18/2011 Document Reviewed: 09/27/2009 [employer] Patient Information 2014 [place], [place].

## 2013-12-16 NOTE — Progress Notes (Signed)
Subjective:    Patient ID: Heather Stevenson, female    DOB: [birth year], 80 y.o.   MRN: [id number]  HPI 80 yo [facility] 5/28-12/09/2013 with new onset Afib w/ RVR  With CHA2DS2 Vasc6 and comorbid HTN, T2_NIDDM w/Stage 4 CKD  (BUN/Cr 40/1.90  & GFR 27 ml/min), COPD/pulmHTN,HLD & AAA who was diuresed  And started on Lovenox and coumadin  Now presents for hospital f/u & coagulation monitoring. Patient c/o dependent edema w/o response to the low dose Furosemide 20 mg. She denies current DOE/PND/Orthopnea or CP. Also, no cough, congestion or sputum pdn. Denies bleeding issues.    Medication List       This list is accurate as of: 12/16/13  7:27 PM.  Always use your most recent med list.               allopurinol 300 MG tablet  Commonly known as:  ZYLOPRIM  Take 300 mg by mouth daily.     BREO ELLIPTA 100-25 MCG/INH Aepb  Generic drug:  Fluticasone Furoate-Vilanterol  Inhale 1 puff into the lungs daily.     CENTRUM tablet  Take 1 tablet by mouth daily.     CVS VIT D 5000 HIGH-POTENCY PO  Take 5,000 Units by mouth daily.     cyanocobalamin 500 MCG tablet  Take 500 mcg by mouth daily.     DEXILANT 60 MG capsule  Generic drug:  dexlansoprazole  Take 60 mg by mouth daily.     diltiazem 240 MG 24 hr capsule  Commonly known as:  CARDIZEM CD  Take 1 capsule (240 mg total) by mouth daily.     enoxaparin 100 MG/ML injection  Commonly known as:  LOVENOX  Inject 1 mL (100 mg total) into the skin daily.     fenofibrate micronized 134 MG capsule  Commonly known as:  LOFIBRA  Take 134 mg by mouth daily before breakfast.     furosemide 20 MG tablet  Commonly known as:  LASIX  Take 1 tablet (20 mg total) by mouth daily. Start 12/10/13     Magnesium 250 MG Tabs  Take 250 mg by mouth daily.     metoprolol succinate 100 MG 24 hr tablet  Commonly known as:  TOPROL-XL  Take 1 tablet (100 mg total) by mouth daily. Take with or immediately following a meal.     montelukast 10 MG tablet   Commonly known as:  SINGULAIR  Take 10 mg by mouth at bedtime.     ondansetron 4 MG disintegrating tablet  Commonly known as:  ZOFRAN-ODT  Take 4 mg by mouth every 6 (six) hours as needed for nausea or vomiting.     PROAIR HFA 108 (90 BASE) MCG/ACT inhaler  Generic drug:  albuterol  Inhale 2 puffs into the lungs every 6 (six) hours as needed for shortness of breath.     rosuvastatin 5 MG tablet  Commonly known as:  CRESTOR  Take 5 mg by mouth at bedtime.     vitamin C 500 MG tablet  Commonly known as:  ASCORBIC ACID  Take 500 mg by mouth daily.     warfarin 5 MG tablet  Commonly known as:  COUMADIN  Take 1 tablet (5 mg total) by mouth daily.       Allergies  Allergen Reactions  . Hctz [Hydrochlorothiazide]   . Sulfa Antibiotics   . Tetracyclines & Related    Past Medical History  Diagnosis Date  . Hyperlipidemia   .  Gout   . History of small bowel obstruction   . COPD (chronic obstructive pulmonary disease)   . Substance abuse     Tobacco  . Depression   . Pulmonary hypertension   . Umbilical hernia   . Bronchitis   . Atrial fibrillation   . High cholesterol   . Pneumonia   . Arthritis   . Abdominal aortic aneurysm     followed by Dr Kellie Simmering- 4.8 cm per [place] 6/12- [facility]  . Complication of anesthesia     slow to wake up per pt- anesthesia record on chart  . Hypertension   . Anxiety   . Atherosclerosis of aorta    Past Surgical History  Procedure Laterality Date  . Small intestine surgery  05/2010  . Abdominal hysterectomy      Vaginal  . Hernia repair  05/2010    incarcerated Umbilical hernia  . Tonsillectomy    . Incisional hernia repair  02/21/2012    Procedure: LAPAROSCOPIC INCISIONAL HERNIA;  Surgeon: Harl Bowie, MD;  Location: [facility];  Service: General;  Laterality: N/A;   Review of Systems In addition to the HPI above,  No Fever-chills,  No Headache, No changes with Vision or hearing,  No problems swallowing food or Liquids,  No Chest  pain or productive Cough or Shortness of Breath,  No Abdominal pain, No Nausea or Vommitting, Bowel movements are regular,  No Blood in stool or Urine,  No dysuria,  No new skin rashes or bruises,  No new joints pains-aches,  No new weakness, tingling, numbness in any extremity,  No recent weight loss,  No polyuria, polydypsia or polyphagia,  No significant Mental Stressors.  A full 10 point Review of Systems was done, except as stated above, all other Review of Systems were negative    Objective:   Physical Exam  BP 130/90  P 76  T 97.7 F   Resp 16  Ht 5' 5.5"   Wt 232 lb   BMI 38.01 kg/m2  Over- nourished and morbidly obese. HEENT - Eac's patent. TM's Nl.EOM's full. PERRLA. NasoOroPharynx clear. Neck - supple. Nl Thyroid. No bruits nodes JVD Chest -Inc AP. Decreased BS.  Cor - Nl HS. Sl RRR w/o sig MGR. PP 1(+) w/ 1-2 (+) pretibial/ankle edema. Abd - No palpable organomegaly, masses or tenderness. BS nl. MS- FROM. w/o deformities. Muscle power tone and bulk Nl. Gait Nl. Neuro - No obvious Cr N abnormalities.  Sensory, motor and Cerebellar functions appear Nl w/o focal abnormalities.  Assessment & Plan:   1. Atrial fibrillation   2. Congestive heart failure, recent - increase Furosemide 20 mg x 2 = 40 mg/daily  3. Other dyspnea and respiratory abnormality - Brain natriuretic peptide   4. Long term (current) use of anticoagulants - Protime-INR  5. Encounter for long-term (current) use of other medications - CBC with Differential - BASIC METABOLIC PANEL WITH GFR  - ROV 1 week for coag mgmt. Consider taper or d/c  CCB if edema persists

## 2013-12-17 ENCOUNTER — Other Ambulatory Visit: Payer: Self-pay | Admitting: Emergency Medicine

## 2013-12-17 DIAGNOSIS — R6889 Other general symptoms and signs: Secondary | ICD-10-CM

## 2013-12-17 LAB — PROTIME-INR
INR: 3.81 — AB (ref <1.50)
Prothrombin Time: 36.4 seconds — ABNORMAL HIGH (ref 11.6–15.2)

## 2013-12-17 LAB — BASIC METABOLIC PANEL WITH GFR
BUN: 39 mg/dL — ABNORMAL HIGH (ref 6–23)
CALCIUM: 8.5 mg/dL (ref 8.4–10.5)
CHLORIDE: 96 meq/L (ref 96–112)
CO2: 24 mEq/L (ref 19–32)
CREATININE: 2.26 mg/dL — AB (ref 0.50–1.10)
GFR, Est African American: 23 mL/min — ABNORMAL LOW
GFR, Est Non African American: 20 mL/min — ABNORMAL LOW
Glucose, Bld: 140 mg/dL — ABNORMAL HIGH (ref 70–99)
Potassium: 3.6 mEq/L (ref 3.5–5.3)
Sodium: 135 mEq/L (ref 135–145)

## 2013-12-17 LAB — BRAIN NATRIURETIC PEPTIDE: BRAIN NATRIURETIC PEPTIDE: 535.1 pg/mL — AB (ref 0.0–100.0)

## 2013-12-17 MED ORDER — WARFARIN SODIUM 5 MG PO TABS: 5.0000 mg | ORAL_TABLET | Freq: Every day | ORAL | Status: DC

## 2013-12-22 ENCOUNTER — Ambulatory Visit (INDEPENDENT_AMBULATORY_CARE_PROVIDER_SITE_OTHER): Payer: Medicare Other | Admitting: Emergency Medicine

## 2013-12-22 ENCOUNTER — Encounter: Payer: Self-pay | Admitting: Emergency Medicine

## 2013-12-22 VITALS — BP 126/78 | HR 62 | Temp 98.2°F | Resp 18 | Ht 65.5 in | Wt 226.0 lb

## 2013-12-22 DIAGNOSIS — I509 Heart failure, unspecified: Secondary | ICD-10-CM

## 2013-12-22 DIAGNOSIS — Z79899 Other long term (current) drug therapy: Secondary | ICD-10-CM

## 2013-12-22 DIAGNOSIS — Z7901 Long term (current) use of anticoagulants: Secondary | ICD-10-CM

## 2013-12-22 DIAGNOSIS — I4891 Unspecified atrial fibrillation: Secondary | ICD-10-CM

## 2013-12-22 LAB — CBC WITH DIFFERENTIAL/PLATELET
BASOS ABS: 0.1 10*3/uL (ref 0.0–0.1)
BASOS PCT: 1 % (ref 0–1)
EOS PCT: 3 % (ref 0–5)
Eosinophils Absolute: 0.2 10*3/uL (ref 0.0–0.7)
HEMATOCRIT: 41.4 % (ref 36.0–46.0)
Hemoglobin: 14 g/dL (ref 12.0–15.0)
Lymphocytes Relative: 12 % (ref 12–46)
Lymphs Abs: 0.9 10*3/uL (ref 0.7–4.0)
MCH: 30.8 pg (ref 26.0–34.0)
MCHC: 33.8 g/dL (ref 30.0–36.0)
MCV: 91.2 fL (ref 78.0–100.0)
MONO ABS: 0.6 10*3/uL (ref 0.1–1.0)
Monocytes Relative: 8 % (ref 3–12)
Neutro Abs: 5.5 10*3/uL (ref 1.7–7.7)
Neutrophils Relative %: 76 % (ref 43–77)
Platelets: 336 10*3/uL (ref 150–400)
RBC: 4.54 MIL/uL (ref 3.87–5.11)
RDW: 14.8 % (ref 11.5–15.5)
WBC: 7.3 10*3/uL (ref 4.0–10.5)

## 2013-12-22 LAB — BASIC METABOLIC PANEL WITH GFR
BUN: 42 mg/dL — AB (ref 6–23)
CO2: 26 mEq/L (ref 19–32)
Calcium: 9.2 mg/dL (ref 8.4–10.5)
Chloride: 95 mEq/L — ABNORMAL LOW (ref 96–112)
Creat: 2.09 mg/dL — ABNORMAL HIGH (ref 0.50–1.10)
GFR, EST AFRICAN AMERICAN: 25 mL/min — AB
GFR, EST NON AFRICAN AMERICAN: 22 mL/min — AB
Glucose, Bld: 145 mg/dL — ABNORMAL HIGH (ref 70–99)
Potassium: 3.4 mEq/L — ABNORMAL LOW (ref 3.5–5.3)
Sodium: 136 mEq/L (ref 135–145)

## 2013-12-22 NOTE — Progress Notes (Signed)
Subjective:    Patient ID: CALISTA CRAIN, female    DOB: 09-25-1929, 80 y.o.   MRN: [id number]  HPI Comments: 80 yo WF with close f/u with new AFIB/ CHF diagnosis. She is having PT 3 x week, home health is monitoring weight with scales CHF program. She is starting to walk a little more and is trying to improve diet. Her family has been very supportive and accompanies her today. She denies bruising/ bleeding but notes she is still easily fatigued. She notes overall swelling has improved in both LE.   She notes mood is a little better but still depressed. She is uncertain why Cardio f/u was canceled. She is aware of Nephrology appointment. She canceled DERM appointment for probable SCC right forearm.    Medication List       This list is accurate as of: 12/22/13  4:33 PM.  Always use your most recent med list.               allopurinol 300 MG tablet  Commonly known as:  ZYLOPRIM  Take 300 mg by mouth daily.     BREO ELLIPTA 100-25 MCG/INH Aepb  Generic drug:  Fluticasone Furoate-Vilanterol  Inhale 1 puff into the lungs daily.     CENTRUM tablet  Take 1 tablet by mouth daily.     CVS VIT D 5000 HIGH-POTENCY PO  Take 5,000 Units by mouth daily.     cyanocobalamin 500 MCG tablet  Take 500 mcg by mouth daily.     DEXILANT 60 MG capsule  Generic drug:  dexlansoprazole  Take 60 mg by mouth daily.     diltiazem 240 MG 24 hr capsule  Commonly known as:  CARDIZEM CD  Take 1 capsule (240 mg total) by mouth daily.     fenofibrate micronized 134 MG capsule  Commonly known as:  LOFIBRA  Take 134 mg by mouth daily before breakfast.     furosemide 40 MG tablet  Commonly known as:  LASIX  Take 40 mg by mouth daily.     Magnesium 250 MG Tabs  Take 250 mg by mouth daily.     metoprolol succinate 100 MG 24 hr tablet  Commonly known as:  TOPROL-XL  Take 1 tablet (100 mg total) by mouth daily. Take with or immediately following a meal.     montelukast 10 MG tablet  Commonly  known as:  SINGULAIR  Take 10 mg by mouth at bedtime.     ondansetron 4 MG disintegrating tablet  Commonly known as:  ZOFRAN-ODT  Take 4 mg by mouth every 6 (six) hours as needed for nausea or vomiting.     PROAIR HFA 108 (90 BASE) MCG/ACT inhaler  Generic drug:  albuterol  Inhale 2 puffs into the lungs every 6 (six) hours as needed for shortness of breath.     rosuvastatin 5 MG tablet  Commonly known as:  CRESTOR  Take 5 mg by mouth at bedtime.     vitamin C 500 MG tablet  Commonly known as:  ASCORBIC ACID  Take 500 mg by mouth daily.     warfarin 5 MG tablet  Commonly known as:  COUMADIN  Take 5 mg by mouth daily.       Allergies  Allergen Reactions  . Hctz [Hydrochlorothiazide]   . Sulfa Antibiotics   . Tetracyclines & Related    Past Medical History  Diagnosis Date  . Hyperlipidemia   . Gout   . History of small  bowel obstruction   . COPD (chronic obstructive pulmonary disease)   . Substance abuse     Tobacco  . Depression   . Pulmonary hypertension   . Umbilical hernia   . Bronchitis   . Atrial fibrillation   . High cholesterol   . Pneumonia   . Arthritis   . Abdominal aortic aneurysm     followed by Dr Kellie Simmering- 4.8 cm per [place] 6/12- [facility]  . Complication of anesthesia     slow to wake up per pt- anesthesia record on chart  . Hypertension   . Anxiety   . Atherosclerosis of aorta       Review of Systems  Constitutional: Positive for fatigue.  Cardiovascular: Positive for leg swelling.  All other systems reviewed and are negative.  BP 126/78  Pulse 62  Temp(Src) 98.2 F (36.8 C) (Temporal)  Resp 18  Ht 5' 5.5" (1.664 m)  Wt 226 lb (102.513 kg)  BMI 37.02 kg/m2  SpO2 94%     Objective:   Physical Exam  Nursing note and vitals reviewed. Constitutional: She is oriented to person, place, and time. She appears well-developed and well-nourished. No distress.  HENT:  Head: Normocephalic and atraumatic.  Right Ear: External ear normal.  Left  Ear: External ear normal.  Nose: Nose normal.  Mouth/Throat: Oropharynx is clear and moist.  Eyes: Conjunctivae and EOM are normal.  Neck: Normal range of motion. Neck supple. No JVD present. No thyromegaly present.  Cardiovascular: Normal rate, regular rhythm, normal heart sounds and intact distal pulses.   Bil LE mild edema + improvement  Pulmonary/Chest: Effort normal and breath sounds normal.  Abdominal: Soft. Bowel sounds are normal. She exhibits no distension and no mass. There is no tenderness. There is no rebound and no guarding.  Musculoskeletal: Normal range of motion. She exhibits no edema and no tenderness.  Lymphadenopathy:    She has no cervical adenopathy.  Neurological: She is alert and oriented to person, place, and time. No cranial nerve deficit.  Skin: Skin is warm and dry. No rash noted. No erythema. No pallor.  No change with right forearm lesion  Psychiatric: She has a normal mood and affect. Her behavior is normal. Judgment and thought content normal.          Assessment & Plan:  1. Afib prophylaxis- Check labs, Call office with any unusual bleeding/ bruising/ weakness   2. CHF- + overall improvement, Recheck, strong discussion about need for compliance, call concerns  3. ? Depression- for now continue to increase activity and avoid restarting medicine if possible, needs counseling.

## 2013-12-22 NOTE — Addendum Note (Signed)
Addended by: Kelby Aline R on: 12/22/2013 01:38 PM   Modules accepted: Orders

## 2013-12-22 NOTE — Patient Instructions (Signed)
Heart Failure Heart failure means your heart has trouble pumping blood. This makes it hard for your body to work well. Heart failure is usually a long-term (chronic) condition. You must take good care of yourself and follow your doctor's treatment plan. HOME CARE  Take your heart medicine as told by your doctor.  Do not stop taking medicine unless your doctor tells you to.  Do not skip any dose of medicine.  Refill your medicines before they run out.  Take other medicines only as told by your doctor or pharmacist.  Stay active if told by your doctor. The elderly and people with severe heart failure should talk with a doctor about physical activity.  Eat heart healthy foods. Choose foods that are without trans fat and are low in saturated fat, cholesterol, and salt (sodium). This includes fresh or frozen fruits and vegetables, fish, lean meats, fat-free or low-fat dairy foods, whole grains, and high-fiber foods. Lentils and dried peas and beans (legumes) are also good choices.  Limit salt if told by your doctor.  Cook in a healthy way. Roast, grill, broil, bake, poach, steam, or stir-fry foods.  Limit fluids as told by your doctor.  Weigh yourself every morning. Do this after you pee (urinate) and before you eat breakfast. Write down your weight to give to your doctor.  Take your blood pressure and write it down if your doctor tell you to.  Ask your doctor how to check your pulse. Check your pulse as told.  Lose weight if told by your doctor.  Stop smoking or chewing tobacco. Do not use gum or patches that help you quit without your doctor's approval.  Schedule and go to doctor visits as told.  Nonpregnant women should have no more than 1 drink a day. Men should have no more than 2 drinks a day. Talk to your doctor about drinking alcohol.  Stop illegal drug use.  Stay current with shots (immunizations).  Manage your health conditions as told by your doctor.  Learn to manage  your stress.  Rest when you are tired.  If it is really hot outside:  Avoid intense activities.  Use air conditioning or fans, or get in a cooler place.  Avoid caffeine and alcohol.  Wear loose-fitting, lightweight, and light-colored clothing.  If it is really cold outside:  Avoid intense activities.  Layer your clothing.  Wear mittens or gloves, a hat, and a scarf when going outside.  Avoid alcohol.  Learn about heart failure and get support as needed.  Get help to maintain or improve your quality of life and your ability to care for yourself as needed. GET HELP IF:   You gain 03 lb/1.4 kg or more in 1 day or 05 lb/2.3 kg in a week.  You are more short of breath than usual.  You cannot do your normal activities.  You tire easily.  You cough more than normal, especially with activity.  You have any or more puffiness (swelling) in areas such as your hands, feet, ankles, or belly (abdomen).  You cannot sleep because it is hard to breathe.  You feel like your heart is beating fast (palpitations).  You get dizzy or lightheaded when you stand up. GET HELP RIGHT AWAY IF:   You have trouble breathing.  There is a change in mental status, such as becoming less alert or not being able to focus.  You have chest pain or discomfort.  You faint. MAKE SURE YOU:   Understand these  instructions.  Will watch your condition.  Will get help right away if you are not doing well or get worse. Document Released: 04/04/2008 Document Revised: 10/21/2012 Document Reviewed: 01/25/2012 [facility] Patient Information 2014 [place], [place].

## 2013-12-23 ENCOUNTER — Other Ambulatory Visit: Payer: Self-pay | Admitting: Emergency Medicine

## 2013-12-23 LAB — PROTIME-INR
INR: 2.7 — ABNORMAL HIGH (ref <1.50)
Prothrombin Time: 28 seconds — ABNORMAL HIGH (ref 11.6–15.2)

## 2013-12-23 LAB — BRAIN NATRIURETIC PEPTIDE: Brain Natriuretic Peptide: 429.5 pg/mL — ABNORMAL HIGH (ref 0.0–100.0)

## 2013-12-23 MED ORDER — POTASSIUM CHLORIDE ER 10 MEQ PO TBCR: 10.0000 meq | EXTENDED_RELEASE_TABLET | Freq: Two times a day (BID) | ORAL | Status: DC

## 2013-12-24 ENCOUNTER — Institutional Professional Consult (permissible substitution): Payer: Medicare Other | Admitting: Cardiology

## 2013-12-25 ENCOUNTER — Ambulatory Visit: Payer: Self-pay | Admitting: Emergency Medicine

## 2013-12-26 ENCOUNTER — Other Ambulatory Visit: Payer: Self-pay | Admitting: *Deleted

## 2013-12-26 MED ORDER — FUROSEMIDE 20 MG PO TABS: 20.0000 mg | ORAL_TABLET | Freq: Two times a day (BID) | ORAL | Status: DC

## 2014-01-01 ENCOUNTER — Ambulatory Visit: Payer: Self-pay | Admitting: Emergency Medicine

## 2014-01-02 ENCOUNTER — Other Ambulatory Visit: Payer: Self-pay | Admitting: *Deleted

## 2014-01-02 MED ORDER — METOPROLOL SUCCINATE ER 100 MG PO TB24: 100.0000 mg | ORAL_TABLET | Freq: Every day | ORAL | Status: DC

## 2014-01-02 MED ORDER — DILTIAZEM HCL ER COATED BEADS 240 MG PO CP24: 240.0000 mg | ORAL_CAPSULE | Freq: Every day | ORAL | Status: DC

## 2014-01-07 ENCOUNTER — Encounter: Payer: Self-pay | Admitting: Internal Medicine

## 2014-01-07 ENCOUNTER — Encounter: Payer: Self-pay | Admitting: Physician Assistant

## 2014-01-07 ENCOUNTER — Ambulatory Visit (INDEPENDENT_AMBULATORY_CARE_PROVIDER_SITE_OTHER): Payer: Medicare Other | Admitting: Physician Assistant

## 2014-01-07 VITALS — BP 134/82 | HR 72 | Temp 98.8°F | Resp 16 | Wt 229.4 lb

## 2014-01-07 DIAGNOSIS — Z79899 Other long term (current) drug therapy: Secondary | ICD-10-CM

## 2014-01-07 DIAGNOSIS — R0989 Other specified symptoms and signs involving the circulatory and respiratory systems: Secondary | ICD-10-CM

## 2014-01-07 DIAGNOSIS — I4891 Unspecified atrial fibrillation: Secondary | ICD-10-CM

## 2014-01-07 DIAGNOSIS — R0609 Other forms of dyspnea: Secondary | ICD-10-CM

## 2014-01-07 DIAGNOSIS — R0600 Dyspnea, unspecified: Secondary | ICD-10-CM

## 2014-01-07 LAB — CBC WITH DIFFERENTIAL/PLATELET
BASOS ABS: 0.1 10*3/uL (ref 0.0–0.1)
Basophils Relative: 1 % (ref 0–1)
EOS ABS: 0.3 10*3/uL (ref 0.0–0.7)
Eosinophils Relative: 4 % (ref 0–5)
HCT: 38.9 % (ref 36.0–46.0)
Hemoglobin: 13.4 g/dL (ref 12.0–15.0)
Lymphocytes Relative: 20 % (ref 12–46)
Lymphs Abs: 1.4 10*3/uL (ref 0.7–4.0)
MCH: 31.4 pg (ref 26.0–34.0)
MCHC: 34.4 g/dL (ref 30.0–36.0)
MCV: 91.1 fL (ref 78.0–100.0)
Monocytes Absolute: 0.5 10*3/uL (ref 0.1–1.0)
Monocytes Relative: 7 % (ref 3–12)
NEUTROS ABS: 4.6 10*3/uL (ref 1.7–7.7)
NEUTROS PCT: 68 % (ref 43–77)
Platelets: 262 10*3/uL (ref 150–400)
RBC: 4.27 MIL/uL (ref 3.87–5.11)
RDW: 15.1 % (ref 11.5–15.5)
WBC: 6.8 10*3/uL (ref 4.0–10.5)

## 2014-01-07 NOTE — Patient Instructions (Addendum)
Do the following things EVERYDAY: 1) Weigh yourself in the morning before breakfast. Write it down and keep it in a log. 2) Take your medicines as prescribed 3) Eat low salt foods-Limit salt (sodium) to 2000 mg per day.  4) Stay as active as you can everyday 5) Limit all fluids for the day to less than 2 liters   Increase lasix to 3 in the morning, increase potassium to two pills daily.   Sodium and Fluid Restriction Some health conditions may require you to restrict your sodium and fluid intake. Sodium is part of the salt in the blood. Sodium may be restricted because when you take in a lot of salt, you become thirsty. Limiting salt with help you become less thirsty and may make it easier to restrict fluid. Talk to your caregiver or dietician about how many cups of fluid and how many milligrams of sodium you are allowed each day. If your caregiver has restricted your sodium and fluids, usually the amount you can drink depends on several things, such as:  Your urine output.  How much fluid you are retaining.  Your blood pressure. Every 2 cups (500 mL) of fluid retained in the body becomes an extra 1 pound (0.5 kg) of body weight. The following are examples of some fluids you will have to restrict:  Tea, coffee, soda, lemonade, milk, water, and juice.  Alcoholic beverages.  Cream.  Gravy.  Ice cubes.  Soup and broth. The following are foods that become liquid at room temperature. These foods will count towards your fluid intake.  Ice cream and ice milk.  Frozen yogurt and sherbet.  Frozen ice pops.  Flavored gelatin. YOU MAY BE TAKING IN TOO MUCH FLUID IF:  Your weight increases.  Your face, hands, legs, feet, and abdomen start to swell.  You have trouble breathing. HOME CARE INSTRUCTIONS If you follow a low sodium diet closely, you will eat approximately 1,500 mg of sodium a day.   Avoid salty foods. This increases your thirst and makes fluid control more difficult.  Foods high in sodium include:  Most canned foods, including most meats.  Most processed foods, including most meats.  Cheese.  Dried pasta and rice mixes.  Snack foods (chips, popcorn, pretzels, cheese puffs, salted nuts).  Dips, sauces, and salad dressings.  Do not use salt in cooking or add salt to your meal. Lacinda Axon with herbs and spices, but not those that have salt in the name. Ask your caregiver if it is okay to use salt substitutes.  Eat home-prepared meals. Use fresh ingredients. Avoid canned, frozen, or packaged meals.  Read food labels to see how much sodium is in the food. Know how much sodium you are allowed each day.  When eating out, ask for dressings and sauces on the side.  Weigh yourself every morning with an empty bladder before you eat or drink. If your weight is going up, you are retaining fluid.  Freeze fruit juice or water in an ice cube tray. Use this as part of your fluid allowance.  Brush your teeth often or rinse your mouth with mouthwash to help your dry mouth. Lemon wedges, hard sour candies, chewing gum, or breath spray may help to moisten your mouth too.  Add a slice of fresh lemon or lemon juice to water or ice. This helps satisfy your thirst.  Try frozen fruits between meals, such as grapes or strawberries.  Swallow your pills along with meals or soft foods. This helps you  save your fluid for something you enjoy.  Use small cups and glasses and learn to sip fluids slowly.  Keep your home cooler. Keep the air in your home as humid as possible. Dry air increases thirst.  Avoid being out in the hot sun. Each morning, fill a jug with the amount of water you are allowed for the day. You can use this water as a guideline for fluid allowance. Each time you take in fluid, pour an equal amount of water out of the container. This helps you to see how much fluid you are taking in. It also helps plan your fluid intake for the rest of the day. CONVERSIONS TO  HELP MEASURE FLUID INTAKE  1 cup equals 8 oz (240 mL).   cup equals 6 oz (180 mL).   cup equals 5  oz (160 mL).   cup equals 4 oz (120 mL).   cup equals 2  oz (80 mL).   cup equals 2 oz (60 mL).  2 tbs equals 1 oz (30 mL). Document Released: 04/23/2007 Document Revised: 12/26/2011 Document Reviewed: 12/08/2011 [employer] Patient Information 2015 [place], [place]. This information is not intended to replace advice given to you by your health care provider. Make sure you discuss any questions you have with your health care provider.

## 2014-01-07 NOTE — Progress Notes (Signed)
   Subjective:    Patient ID: Heather Stevenson, female    DOB: 1930/03/08, 80 y.o.   MRN: [id number]  HPI 80 y.o. obese female with complicated history of CHF, AAA, COPD, Afib, CKD on coumadin presents for 2 week follow up. She has had some worsening swelling, her weight is up 3 lbs, denies PND, dyspnea, orthopnea.  Her Coumadin was changed to coumadin to 1/2 =2.5 mg MWF and 1 tab = 5 mg rest of week. She had hypokalemia and is on 1 potassium daily. O2 is 88-94%  Lab Results  Component Value Date   INR 2.70* 12/22/2013   INR 3.81* 12/16/2013   INR 1.21 12/09/2013   Wt Readings from Last 3 Encounters:  01/07/14 229 lb 6.4 oz (104.055 kg)  12/22/13 226 lb (102.513 kg)  12/16/13 232 lb (105.235 kg)   Lab Results  Component Value Date   CREATININE 2.09* 12/22/2013   BUN 42* 12/22/2013   NA 136 12/22/2013   K 3.4* 12/22/2013   CL 95* 12/22/2013   CO2 26 12/22/2013   BNP    Component Value Date/Time   PROBNP 10429.0* 12/04/2013 1428    Review of Systems  Constitutional: Positive for fatigue. Negative for fever, chills, diaphoresis, activity change, appetite change and unexpected weight change.  HENT: Negative.   Eyes: Negative.   Respiratory: Positive for cough and shortness of breath. Negative for apnea, choking, chest tightness, wheezing and stridor.   Cardiovascular: Positive for leg swelling. Negative for chest pain and palpitations.  Gastrointestinal: Negative.   Genitourinary: Negative.   Musculoskeletal: Positive for gait problem.  Neurological: Negative.        Objective:   Physical Exam  Constitutional: She is oriented to person, place, and time. She appears well-developed and well-nourished.  obese  HENT:  Head: Normocephalic and atraumatic.  Eyes: Conjunctivae are normal. Pupils are equal, round, and reactive to light.  Neck: Normal range of motion. Neck supple. No JVD present.  Cardiovascular: Normal rate.  An irregularly irregular rhythm present.  2+ bilateral edema   Pulmonary/Chest: Effort normal. She has decreased breath sounds (diffuse).  Abdominal: Soft.  obese  Musculoskeletal:  Gait slow, slightly unsteady  Lymphadenopathy:    She has no cervical adenopathy.  Neurological: She is alert and oriented to person, place, and time. No cranial nerve deficit.  Skin: Skin is warm and dry. No rash noted.      Assessment & Plan:  Dyspnea with leg swelling- CHF, vs infection, COPD, Get  labs, Give ABX if CBC elevated, and discussed CHF recommendations.  She states that she feels the Lasix 72m BID is not helping her pee as much, will increase to 633mtotal but may need to switch to Torsemide/Demadex next visit due to CKD. Follow up in 1 month with Dr. McMelford Aaset CPEmmettIf any increasing shortness of breath, swelling, or chest pressure go to ER immediately.  Edema- elevate legs TID, increase activity, increase water, decrease sodium intake.  Wear compression socks more routinely if available. Return to the office if no change with symptoms. Check labs. Coumadin- check INR

## 2014-01-08 LAB — MAGNESIUM: Magnesium: 2 mg/dL (ref 1.5–2.5)

## 2014-01-08 LAB — PROTIME-INR
INR: 3.51 — AB (ref <1.50)
Prothrombin Time: 35.2 seconds — ABNORMAL HIGH (ref 11.6–15.2)

## 2014-01-08 LAB — BASIC METABOLIC PANEL WITH GFR
BUN: 33 mg/dL — AB (ref 6–23)
CALCIUM: 9.1 mg/dL (ref 8.4–10.5)
CO2: 26 mEq/L (ref 19–32)
Chloride: 93 mEq/L — ABNORMAL LOW (ref 96–112)
Creat: 1.88 mg/dL — ABNORMAL HIGH (ref 0.50–1.10)
GFR, EST AFRICAN AMERICAN: 28 mL/min — AB
GFR, EST NON AFRICAN AMERICAN: 25 mL/min — AB
Glucose, Bld: 135 mg/dL — ABNORMAL HIGH (ref 70–99)
POTASSIUM: 3.5 meq/L (ref 3.5–5.3)
Sodium: 132 mEq/L — ABNORMAL LOW (ref 135–145)

## 2014-01-08 LAB — BRAIN NATRIURETIC PEPTIDE: Brain Natriuretic Peptide: 430.9 pg/mL — ABNORMAL HIGH (ref 0.0–100.0)

## 2014-01-16 ENCOUNTER — Other Ambulatory Visit: Payer: Self-pay | Admitting: Physician Assistant

## 2014-02-05 ENCOUNTER — Encounter: Payer: Self-pay | Admitting: Internal Medicine

## 2014-02-10 ENCOUNTER — Other Ambulatory Visit: Payer: Self-pay | Admitting: Physician Assistant

## 2014-02-10 ENCOUNTER — Other Ambulatory Visit: Payer: Self-pay | Admitting: Internal Medicine

## 2014-02-10 MED ORDER — FUROSEMIDE 20 MG PO TABS: 20.0000 mg | ORAL_TABLET | Freq: Three times a day (TID) | ORAL | Status: DC

## 2014-02-19 ENCOUNTER — Encounter: Payer: Self-pay | Admitting: Internal Medicine

## 2014-02-19 ENCOUNTER — Ambulatory Visit: Payer: Medicare Other | Admitting: Internal Medicine

## 2014-02-19 DIAGNOSIS — I10 Essential (primary) hypertension: Secondary | ICD-10-CM

## 2014-02-19 DIAGNOSIS — E785 Hyperlipidemia, unspecified: Secondary | ICD-10-CM

## 2014-02-19 DIAGNOSIS — Z1212 Encounter for screening for malignant neoplasm of rectum: Secondary | ICD-10-CM

## 2014-02-19 DIAGNOSIS — R7309 Other abnormal glucose: Secondary | ICD-10-CM

## 2014-02-19 DIAGNOSIS — Z79899 Other long term (current) drug therapy: Secondary | ICD-10-CM

## 2014-02-19 DIAGNOSIS — E559 Vitamin D deficiency, unspecified: Secondary | ICD-10-CM

## 2014-02-19 DIAGNOSIS — Z7901 Long term (current) use of anticoagulants: Secondary | ICD-10-CM

## 2014-02-19 LAB — CBC WITH DIFFERENTIAL/PLATELET
BASOS ABS: 0.1 10*3/uL (ref 0.0–0.1)
Basophils Relative: 1 % (ref 0–1)
EOS ABS: 0.2 10*3/uL (ref 0.0–0.7)
Eosinophils Relative: 3 % (ref 0–5)
HEMATOCRIT: 42 % (ref 36.0–46.0)
Hemoglobin: 14.6 g/dL (ref 12.0–15.0)
Lymphocytes Relative: 30 % (ref 12–46)
Lymphs Abs: 2.1 10*3/uL (ref 0.7–4.0)
MCH: 31.4 pg (ref 26.0–34.0)
MCHC: 34.8 g/dL (ref 30.0–36.0)
MCV: 90.3 fL (ref 78.0–100.0)
Monocytes Absolute: 0.5 10*3/uL (ref 0.1–1.0)
Monocytes Relative: 7 % (ref 3–12)
Neutro Abs: 4.1 10*3/uL (ref 1.7–7.7)
Neutrophils Relative %: 59 % (ref 43–77)
PLATELETS: 288 10*3/uL (ref 150–400)
RBC: 4.65 MIL/uL (ref 3.87–5.11)
RDW: 15.9 % — AB (ref 11.5–15.5)
WBC: 7 10*3/uL (ref 4.0–10.5)

## 2014-02-19 NOTE — Progress Notes (Signed)
Patient ID: Heather Stevenson, female   DOB: 06/13/1930, 80 y.o.   MRN: [id number]  Patient arrived 45 minutes late for her appointment    And was asked to reschedule and please  be on time for her next appointment   Labs were drawn today

## 2014-02-19 NOTE — Patient Instructions (Signed)
Preventive Care for Adults A healthy lifestyle and preventive care can promote health and wellness. Preventive health guidelines for women include the following key practices.  A routine yearly physical is a good way to check with your health care provider about your health and preventive screening. It is a chance to share any concerns and updates on your health and to receive a thorough exam.  Visit your dentist for a routine exam and preventive care every 6 months. Brush your teeth twice a day and floss once a day. Good oral hygiene prevents tooth decay and gum disease.  The frequency of eye exams is based on your age, health, family medical history, use of contact lenses, and other factors. Follow your health care provider's recommendations for frequency of eye exams.  Eat a healthy diet. Foods like vegetables, fruits, whole grains, low-fat dairy products, and lean protein foods contain the nutrients you need without too many calories. Decrease your intake of foods high in solid fats, added sugars, and salt. Eat the right amount of calories for you.Get information about a proper diet from your health care provider, if necessary.  Regular physical exercise is one of the most important things you can do for your health. Most adults should get at least 150 minutes of moderate-intensity exercise (any activity that increases your heart rate and causes you to sweat) each week. In addition, most adults need muscle-strengthening exercises on 2 or more days a week.  Maintain a healthy weight. The body mass index (BMI) is a screening tool to identify possible weight problems. It provides an estimate of body fat based on height and weight. Your health care provider can find your BMI and can help you achieve or maintain a healthy weight.For adults 20 years and older:  A BMI below 18.5 is considered underweight.  A BMI of 18.5 to 24.9 is normal.  A BMI of 25 to 29.9 is considered overweight.  A BMI of  30 and above is considered obese.  Maintain normal blood lipids and cholesterol levels by exercising and minimizing your intake of saturated fat. Eat a balanced diet with plenty of fruit and vegetables. Blood tests for lipids and cholesterol should begin at age 76 and be repeated every 5 years. If your lipid or cholesterol levels are high, you are over 50, or you are at high risk for heart disease, you may need your cholesterol levels checked more frequently.Ongoing high lipid and cholesterol levels should be treated with medicines if diet and exercise are not working.  If you smoke, find out from your health care provider how to quit. If you do not use tobacco, do not start.  Lung cancer screening is recommended for adults aged 22-80 years who are at high risk for developing lung cancer because of a history of smoking. A yearly low-dose CT scan of the lungs is recommended for people who have at least a 30-pack-year history of smoking and are a current smoker or have quit within the past 15 years. A pack year of smoking is smoking an average of 1 pack of cigarettes a day for 1 year (for example: 1 pack a day for 30 years or 2 packs a day for 15 years). Yearly screening should continue until the smoker has stopped smoking for at least 15 years. Yearly screening should be stopped for people who develop a health problem that would prevent them from having lung cancer treatment.  If you are pregnant, do not drink alcohol. If you are breastfeeding,  be very cautious about drinking alcohol. If you are not pregnant and choose to drink alcohol, do not have more than 1 drink per day. One drink is considered to be 12 ounces (355 mL) of beer, 5 ounces (148 mL) of wine, or 1.5 ounces (44 mL) of liquor.  Avoid use of street drugs. Do not share needles with anyone. Ask for help if you need support or instructions about stopping the use of drugs.  High blood pressure causes heart disease and increases the risk of  stroke. Your blood pressure should be checked at least every 1 to 2 years. Ongoing high blood pressure should be treated with medicines if weight loss and exercise do not work.  If you are 75-52 years old, ask your health care provider if you should take aspirin to prevent strokes.  Diabetes screening involves taking a blood sample to check your fasting blood sugar level. This should be done once every 3 years, after age 15, if you are within normal weight and without risk factors for diabetes. Testing should be considered at a younger age or be carried out more frequently if you are overweight and have at least 1 risk factor for diabetes.  Breast cancer screening is essential preventive care for women. You should practice "breast self-awareness." This means understanding the normal appearance and feel of your breasts and may include breast self-examination. Any changes detected, no matter how small, should be reported to a health care provider. Women in their 58s and 30s should have a clinical breast exam (CBE) by a health care provider as part of a regular health exam every 1 to 3 years. After age 16, women should have a CBE every year. Starting at age 53, women should consider having a mammogram (breast X-ray test) every year. Women who have a family history of breast cancer should talk to their health care provider about genetic screening. Women at a high risk of breast cancer should talk to their health care providers about having an MRI and a mammogram every year.  Breast cancer gene (BRCA)-related cancer risk assessment is recommended for women who have family members with BRCA-related cancers. BRCA-related cancers include breast, ovarian, tubal, and peritoneal cancers. Having family members with these cancers may be associated with an increased risk for harmful changes (mutations) in the breast cancer genes BRCA1 and BRCA2. Results of the assessment will determine the need for genetic counseling and  BRCA1 and BRCA2 testing.  Routine pelvic exams to screen for cancer are no longer recommended for nonpregnant women who are considered low risk for cancer of the pelvic organs (ovaries, uterus, and vagina) and who do not have symptoms. Ask your health care provider if a screening pelvic exam is right for you.  If you have had past treatment for cervical cancer or a condition that could lead to cancer, you need Pap tests and screening for cancer for at least 20 years after your treatment. If Pap tests have been discontinued, your risk factors (such as having a new sexual partner) need to be reassessed to determine if screening should be resumed. Some women have medical problems that increase the chance of getting cervical cancer. In these cases, your health care provider may recommend more frequent screening and Pap tests.  The HPV test is an additional test that may be used for cervical cancer screening. The HPV test looks for the virus that can cause the cell changes on the cervix. The cells collected during the Pap test can be  tested for HPV. The HPV test could be used to screen women aged 30 years and older, and should be used in women of any age who have unclear Pap test results. After the age of 30, women should have HPV testing at the same frequency as a Pap test.  Colorectal cancer can be detected and often prevented. Most routine colorectal cancer screening begins at the age of 50 years and continues through age 75 years. However, your health care provider may recommend screening at an earlier age if you have risk factors for colon cancer. On a yearly basis, your health care provider may provide home test kits to check for hidden blood in the stool. Use of a small camera at the end of a tube, to directly examine the colon (sigmoidoscopy or colonoscopy), can detect the earliest forms of colorectal cancer. Talk to your health care provider about this at age 50, when routine screening begins. Direct  exam of the colon should be repeated every 5-10 years through age 75 years, unless early forms of pre-cancerous polyps or small growths are found.  People who are at an increased risk for hepatitis B should be screened for this virus. You are considered at high risk for hepatitis B if:  You were born in a country where hepatitis B occurs often. Talk with your health care provider about which countries are considered high risk.  Your parents were born in a high-risk country and you have not received a shot to protect against hepatitis B (hepatitis B vaccine).  You have HIV or AIDS.  You use needles to inject street drugs.  You live with, or have sex with, someone who has hepatitis B.  You get hemodialysis treatment.  You take certain medicines for conditions like cancer, organ transplantation, and autoimmune conditions.  Hepatitis C blood testing is recommended for all people born from 1945 through 1965 and any individual with known risks for hepatitis C.  Practice safe sex. Use condoms and avoid high-risk sexual practices to reduce the spread of sexually transmitted infections (STIs). STIs include gonorrhea, chlamydia, syphilis, trichomonas, herpes, HPV, and human immunodeficiency virus (HIV). Herpes, HIV, and HPV are viral illnesses that have no cure. They can result in disability, cancer, and death.  You should be screened for sexually transmitted illnesses (STIs) including gonorrhea and chlamydia if:  You are sexually active and are younger than 24 years.  You are older than 24 years and your health care provider tells you that you are at risk for this type of infection.  Your sexual activity has changed since you were last screened and you are at an increased risk for chlamydia or gonorrhea. Ask your health care provider if you are at risk.  If you are at risk of being infected with HIV, it is recommended that you take a prescription medicine daily to prevent HIV infection. This is  called preexposure prophylaxis (PrEP). You are considered at risk if:  You are a heterosexual woman, are sexually active, and are at increased risk for HIV infection.  You take drugs by injection.  You are sexually active with a partner who has HIV.  Talk with your health care provider about whether you are at high risk of being infected with HIV. If you choose to begin PrEP, you should first be tested for HIV. You should then be tested every 3 months for as long as you are taking PrEP.  Osteoporosis is a disease in which the bones lose minerals and strength   with aging. This can result in serious bone fractures or breaks. The risk of osteoporosis can be identified using a bone density scan. Women ages 65 years and over and women at risk for fractures or osteoporosis should discuss screening with their health care providers. Ask your health care provider whether you should take a calcium supplement or vitamin D to reduce the rate of osteoporosis.  Menopause can be associated with physical symptoms and risks. Hormone replacement therapy is available to decrease symptoms and risks. You should talk to your health care provider about whether hormone replacement therapy is right for you.  Use sunscreen. Apply sunscreen liberally and repeatedly throughout the day. You should seek shade when your shadow is shorter than you. Protect yourself by wearing long sleeves, pants, a wide-brimmed hat, and sunglasses year round, whenever you are outdoors.  Once a month, do a whole body skin exam, using a mirror to look at the skin on your back. Tell your health care provider of new moles, moles that have irregular borders, moles that are larger than a pencil eraser, or moles that have changed in shape or color.  Stay current with required vaccines (immunizations).  Influenza vaccine. All adults should be immunized every year.  Tetanus, diphtheria, and acellular pertussis (Td, Tdap) vaccine. Pregnant women should  receive 1 dose of Tdap vaccine during each pregnancy. The dose should be obtained regardless of the length of time since the last dose. Immunization is preferred during the 27th-36th week of gestation. An adult who has not previously received Tdap or who does not know her vaccine status should receive 1 dose of Tdap. This initial dose should be followed by tetanus and diphtheria toxoids (Td) booster doses every 10 years. Adults with an unknown or incomplete history of completing a 3-dose immunization series with Td-containing vaccines should begin or complete a primary immunization series including a Tdap dose. Adults should receive a Td booster every 10 years.  Varicella vaccine. An adult without evidence of immunity to varicella should receive 2 doses or a second dose if she has previously received 1 dose. Pregnant females who do not have evidence of immunity should receive the first dose after pregnancy. This first dose should be obtained before leaving the health care facility. The second dose should be obtained 4-8 weeks after the first dose.  Human papillomavirus (HPV) vaccine. Females aged 13-26 years who have not received the vaccine previously should obtain the 3-dose series. The vaccine is not recommended for use in pregnant females. However, pregnancy testing is not needed before receiving a dose. If a female is found to be pregnant after receiving a dose, no treatment is needed. In that case, the remaining doses should be delayed until after the pregnancy. Immunization is recommended for any person with an immunocompromised condition through the age of 26 years if she did not get any or all doses earlier. During the 3-dose series, the second dose should be obtained 4-8 weeks after the first dose. The third dose should be obtained 24 weeks after the first dose and 16 weeks after the second dose.  Zoster vaccine. One dose is recommended for adults aged 60 years or older unless certain conditions are  present.  Measles, mumps, and rubella (MMR) vaccine. Adults born before 1957 generally are considered immune to measles and mumps. Adults born in 1957 or later should have 1 or more doses of MMR vaccine unless there is a contraindication to the vaccine or there is laboratory evidence of immunity to   each of the three diseases. A routine second dose of MMR vaccine should be obtained at least 28 days after the first dose for students attending postsecondary schools, health care workers, or international travelers. People who received inactivated measles vaccine or an unknown type of measles vaccine during 1963-1967 should receive 2 doses of MMR vaccine. People who received inactivated mumps vaccine or an unknown type of mumps vaccine before 1979 and are at high risk for mumps infection should consider immunization with 2 doses of MMR vaccine. For females of childbearing age, rubella immunity should be determined. If there is no evidence of immunity, females who are not pregnant should be vaccinated. If there is no evidence of immunity, females who are pregnant should delay immunization until after pregnancy. Unvaccinated health care workers born before 1957 who lack laboratory evidence of measles, mumps, or rubella immunity or laboratory confirmation of disease should consider measles and mumps immunization with 2 doses of MMR vaccine or rubella immunization with 1 dose of MMR vaccine.  Pneumococcal 13-valent conjugate (PCV13) vaccine. When indicated, a person who is uncertain of her immunization history and has no record of immunization should receive the PCV13 vaccine. An adult aged 19 years or older who has certain medical conditions and has not been previously immunized should receive 1 dose of PCV13 vaccine. This PCV13 should be followed with a dose of pneumococcal polysaccharide (PPSV23) vaccine. The PPSV23 vaccine dose should be obtained at least 8 weeks after the dose of PCV13 vaccine. An adult aged 19  years or older who has certain medical conditions and previously received 1 or more doses of PPSV23 vaccine should receive 1 dose of PCV13. The PCV13 vaccine dose should be obtained 1 or more years after the last PPSV23 vaccine dose.  Pneumococcal polysaccharide (PPSV23) vaccine. When PCV13 is also indicated, PCV13 should be obtained first. All adults aged 65 years and older should be immunized. An adult younger than age 65 years who has certain medical conditions should be immunized. Any person who resides in a nursing home or long-term care facility should be immunized. An adult smoker should be immunized. People with an immunocompromised condition and certain other conditions should receive both PCV13 and PPSV23 vaccines. People with human immunodeficiency virus (HIV) infection should be immunized as soon as possible after diagnosis. Immunization during chemotherapy or radiation therapy should be avoided. Routine use of PPSV23 vaccine is not recommended for American Indians, [place] Natives, or people younger than 65 years unless there are medical conditions that require PPSV23 vaccine. When indicated, people who have unknown immunization and have no record of immunization should receive PPSV23 vaccine. One-time revaccination 5 years after the first dose of PPSV23 is recommended for people aged 19-64 years who have chronic kidney failure, nephrotic syndrome, asplenia, or immunocompromised conditions. People who received 1-2 doses of PPSV23 before age 65 years should receive another dose of PPSV23 vaccine at age 65 years or later if at least 5 years have passed since the previous dose. Doses of PPSV23 are not needed for people immunized with PPSV23 at or after age 65 years.  Meningococcal vaccine. Adults with asplenia or persistent complement component deficiencies should receive 2 doses of quadrivalent meningococcal conjugate (MenACWY-D) vaccine. The doses should be obtained at least 2 months apart.  Microbiologists working with certain meningococcal bacteria, military recruits, people at risk during an outbreak, and people who travel to or live in countries with a high rate of meningitis should be immunized. A first-year college student up through age   21 years who is living in a residence hall should receive a dose if she did not receive a dose on or after her 16th birthday. Adults who have certain high-risk conditions should receive one or more doses of vaccine.  Hepatitis A vaccine. Adults who wish to be protected from this disease, have certain high-risk conditions, work with hepatitis A-infected animals, work in hepatitis A research labs, or travel to or work in countries with a high rate of hepatitis A should be immunized. Adults who were previously unvaccinated and who anticipate close contact with an international adoptee during the first 60 days after arrival in the [place] from a country with a high rate of hepatitis A should be immunized.  Hepatitis B vaccine. Adults who wish to be protected from this disease, have certain high-risk conditions, may be exposed to blood or other infectious body fluids, are household contacts or sex partners of hepatitis B positive people, are clients or workers in certain care facilities, or travel to or work in countries with a high rate of hepatitis B should be immunized.  Haemophilus influenzae type b (Hib) vaccine. A previously unvaccinated person with asplenia or sickle cell disease or having a scheduled splenectomy should receive 1 dose of Hib vaccine. Regardless of previous immunization, a recipient of a hematopoietic stem cell transplant should receive a 3-dose series 6-12 months after her successful transplant. Hib vaccine is not recommended for adults with HIV infection. Preventive Services / Frequency  Blood pressure check.** / Every 1 to 2 years.  Lipid and cholesterol check.** / Every 5 years beginning at age 67 years.  Lung cancer  screening. / Every year if you are aged 79-80 years and have a 30-pack-year history of smoking and currently smoke or have quit within the past 15 years. Yearly screening is stopped once you have quit smoking for at least 15 years or develop a health problem that would prevent you from having lung cancer treatment.  Clinical breast exam.** / Every year after age 66 years.  BRCA-related cancer risk assessment.** / For women who have family members with a BRCA-related cancer (breast, ovarian, tubal, or peritoneal cancers).  Mammogram.** / Every year beginning at age 11 years and continuing for as long as you are in good health. Consult with your health care provider.  Pap test.** / Every 3 years starting at age 27 years through age 33 or 15 years with a history of 3 consecutive normal Pap tests.  HPV screening.** / Every 3 years from ages 41 years through ages 22 to 45 years with a history of 3 consecutive normal Pap tests.  Fecal occult blood test (FOBT) of stool. / Every year beginning at age 66 years and continuing until age 19 years. You may not need to do this test if you get a colonoscopy every 10 years.  Flexible sigmoidoscopy or colonoscopy.** / Every 5 years for a flexible sigmoidoscopy or every 10 years for a colonoscopy beginning at age 20 years and continuing until age 25 years.  Hepatitis C blood test.** / For all people born from [birth year] through 1965 and any individual with known risks for hepatitis C.  Skin self-exam. / Monthly.  Influenza vaccine. / Every year.  Tetanus, diphtheria, and acellular pertussis (Tdap/Td) vaccine.** / Consult your health care provider. Pregnant women should receive 1 dose of Tdap vaccine during each pregnancy. 1 dose of Td every 10 years.  Varicella vaccine.** / Consult your health care provider. Pregnant females who do not have  evidence of immunity should receive the first dose after pregnancy.  Zoster vaccine.** / 1 dose for adults aged 35 years or  older.  Measles, mumps, rubella (MMR) vaccine.** / You need at least 1 dose of MMR if you were born in 1957 or later. You may also need a 2nd dose. For females of childbearing age, rubella immunity should be determined. If there is no evidence of immunity, females who are not pregnant should be vaccinated. If there is no evidence of immunity, females who are pregnant should delay immunization until after pregnancy.  Pneumococcal 13-valent conjugate (PCV13) vaccine.** / Consult your health care provider.  Pneumococcal polysaccharide (PPSV23) vaccine.** / 1 to 2 doses if you smoke cigarettes or if you have certain conditions.  Meningococcal vaccine.** / Consult your health care provider.  Hepatitis A vaccine.** / Consult your health care provider.  Hepatitis B vaccine.** / Consult your health care provider.  Haemophilus influenzae type b (Hib) vaccine.** / Consult your health care provider. Ages 35 years and over  Blood pressure check.** / Every 1 to 2 years.  Lipid and cholesterol check.** / Every 5 years beginning at age 85 years.  Lung cancer screening. / Every year if you are aged 4-80 years and have a 30-pack-year history of smoking and currently smoke or have quit within the past 15 years. Yearly screening is stopped once you have quit smoking for at least 15 years or develop a health problem that would prevent you from having lung cancer treatment.  Clinical breast exam.** / Every year after age 41 years.  BRCA-related cancer risk assessment.** / For women who have family members with a BRCA-related cancer (breast, ovarian, tubal, or peritoneal cancers).  Mammogram.** / Every year beginning at age 46 years and continuing for as long as you are in good health. Consult with your health care provider.  Pap test.** / Every 3 years starting at age 37 years through age 38 or 2 years with 3 consecutive normal Pap tests. Testing can be stopped between 65 and 70 years with 3 consecutive  normal Pap tests and no abnormal Pap or HPV tests in the past 10 years.  HPV screening.** / Every 3 years from ages 29 years through ages 75 or 73 years with a history of 3 consecutive normal Pap tests. Testing can be stopped between 65 and 70 years with 3 consecutive normal Pap tests and no abnormal Pap or HPV tests in the past 10 years.  Fecal occult blood test (FOBT) of stool. / Every year beginning at age 32 years and continuing until age 63 years. You may not need to do this test if you get a colonoscopy every 10 years.  Flexible sigmoidoscopy or colonoscopy.** / Every 5 years for a flexible sigmoidoscopy or every 10 years for a colonoscopy beginning at age 35 years and continuing until age 66 years.  Hepatitis C blood test.** / For all people born from [birth year] through 1963 and any individual with known risks for hepatitis C.  Osteoporosis screening.** / A one-time screening for women ages 50 years and over and women at risk for fractures or osteoporosis.  Skin self-exam. / Monthly.  Influenza vaccine. / Every year.  Tetanus, diphtheria, and acellular pertussis (Tdap/Td) vaccine.** / 1 dose of Td every 10 years.  Varicella vaccine.** / Consult your health care provider.  Zoster vaccine.** / 1 dose for adults aged 34 years or older.  Pneumococcal 13-valent conjugate (PCV13) vaccine.** / Consult your health care provider.  Pneumococcal  polysaccharide (PPSV23) vaccine.** / 1 dose for all adults aged 61 years and older.  Meningococcal vaccine.** / Consult your health care provider.  Hepatitis A vaccine.** / Consult your health care provider.  Hepatitis B vaccine.** / Consult your health care provider.  Haemophilus influenzae type b (Hib) vaccine.** / Consult your health care provider. ** Family history and personal history of risk and conditions may change your health care provider's recommendations. Document Released: 08/22/2001 Document Revised: 11/10/2013 Document Reviewed:  11/21/2010 [facility] Patient Information 2015 [place], [place]. This information is not intended to replace advice given to you by your health care provider. Make sure you discuss any questions you have with your health care provider.

## 2014-02-20 LAB — LIPID PANEL
CHOLESTEROL: 136 mg/dL (ref 0–200)
HDL: 40 mg/dL (ref >39)
LDL Cholesterol: 40 mg/dL (ref 0–99)
Total CHOL/HDL Ratio: 3.4 Ratio
Triglycerides: 279 mg/dL — ABNORMAL HIGH (ref <150)
VLDL: 56 mg/dL — ABNORMAL HIGH (ref 0–40)

## 2014-02-20 LAB — HEPATIC FUNCTION PANEL
ALBUMIN: 3.8 g/dL (ref 3.5–5.2)
ALT: 20 U/L (ref 0–35)
AST: 28 U/L (ref 0–37)
Alkaline Phosphatase: 78 U/L (ref 39–117)
BILIRUBIN TOTAL: 0.6 mg/dL (ref 0.2–1.2)
Bilirubin, Direct: 0.2 mg/dL (ref 0.0–0.3)
Indirect Bilirubin: 0.4 mg/dL (ref 0.2–1.2)
TOTAL PROTEIN: 7.1 g/dL (ref 6.0–8.3)

## 2014-02-20 LAB — INSULIN, FASTING: Insulin fasting, serum: 23 u[IU]/mL (ref 3–28)

## 2014-02-20 LAB — BASIC METABOLIC PANEL WITH GFR
BUN: 33 mg/dL — ABNORMAL HIGH (ref 6–23)
CO2: 27 mEq/L (ref 19–32)
CREATININE: 1.67 mg/dL — AB (ref 0.50–1.10)
Calcium: 9.2 mg/dL (ref 8.4–10.5)
Chloride: 96 mEq/L (ref 96–112)
GFR, EST NON AFRICAN AMERICAN: 28 mL/min — AB
GFR, Est African American: 33 mL/min — ABNORMAL LOW
Glucose, Bld: 129 mg/dL — ABNORMAL HIGH (ref 70–99)
Potassium: 3.5 mEq/L (ref 3.5–5.3)
Sodium: 134 mEq/L — ABNORMAL LOW (ref 135–145)

## 2014-02-20 LAB — URINALYSIS, MICROSCOPIC ONLY
BACTERIA UA: NONE SEEN
CASTS: NONE SEEN
Crystals: NONE SEEN

## 2014-02-20 LAB — MICROALBUMIN / CREATININE URINE RATIO
Creatinine, Urine: 35.2 mg/dL
Microalb Creat Ratio: 117.9 mg/g — ABNORMAL HIGH (ref 0.0–30.0)
Microalb, Ur: 4.15 mg/dL — ABNORMAL HIGH (ref 0.00–1.89)

## 2014-02-20 LAB — PROTIME-INR
INR: 2.3 — AB (ref <1.50)
PROTHROMBIN TIME: 25.3 s — AB (ref 11.6–15.2)

## 2014-02-20 LAB — VITAMIN D 25 HYDROXY (VIT D DEFICIENCY, FRACTURES): VIT D 25 HYDROXY: 69 ng/mL (ref 30–89)

## 2014-02-20 LAB — TSH: TSH: 3.723 u[IU]/mL (ref 0.350–4.500)

## 2014-02-20 LAB — HEMOGLOBIN A1C
Hgb A1c MFr Bld: 7.2 % — ABNORMAL HIGH (ref <5.7)
Mean Plasma Glucose: 160 mg/dL — ABNORMAL HIGH (ref <117)

## 2014-02-20 LAB — MAGNESIUM: Magnesium: 1.9 mg/dL (ref 1.5–2.5)

## 2014-03-26 ENCOUNTER — Other Ambulatory Visit: Payer: Self-pay | Admitting: *Deleted

## 2014-03-26 MED ORDER — MONTELUKAST SODIUM 10 MG PO TABS: 10.0000 mg | ORAL_TABLET | Freq: Every day | ORAL | Status: DC

## 2014-03-26 MED ORDER — DEXLANSOPRAZOLE 60 MG PO CPDR: 60.0000 mg | DELAYED_RELEASE_CAPSULE | Freq: Every day | ORAL | Status: DC

## 2014-04-09 ENCOUNTER — Other Ambulatory Visit: Payer: Self-pay | Admitting: *Deleted

## 2014-04-09 MED ORDER — POTASSIUM CHLORIDE ER 10 MEQ PO TBCR: 10.0000 meq | EXTENDED_RELEASE_TABLET | Freq: Two times a day (BID) | ORAL | Status: DC

## 2014-04-30 NOTE — Progress Notes (Signed)
Patient ID: Heather Stevenson, female   DOB: 23-Feb-1930, 80 y.o.   MRN: [id number]  Annual Screening Comprehensive Examination  This very nice 80 y.o.WF presents for complete physical.  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 Also patient has a known AAA of 5.5 cm and refuses to cinsider surgery or stenting. Patient  has COPD compromised by ongoing smoking and recent O2 sats on Rm Air range 88-94% at rest. Patient refuses to consider smoking cessation. Patient's daughter reports that she seems depressed with Apathy and lack of motivation and requests trial with an anti-depressant.    HTN predates since 2010. Patient's BP has been controlled at home and patient denies any cardiac symptoms as chest pain, palpitations, shortness of breath, dizziness or ankle swelling. Patient was hospitalized in May 2015 with Afib and started on coumadin. She was also dx'd with right heart failure & started on lasix  and has done well since.Today's BP: 108/64 mmHg    Patient's hyperlipidemia is controlled with diet and medications. Patient denies myalgias or other medication SE's. Last lipids were at goal - Total Chol 136; HDL 40; LDL 40; with elevated Trig 279 on 02/19/2014.   Patient has Morbid Obesity (BMI 38+) and consequent T2_NIDDM which she has failed controlling by diet and also has  CKD4 (GFR 28 ml/min).  Patient denies reactive hypoglycemic symptoms, visual blurring, diabetic polys, or paresthesias. Last A1c was  7.2% on 02/19/2014.   Finally, patient has history of Vitamin D Deficiency and last Vitamin D was 02/19/2014: Vit D, 25-Hydroxy 69  Medication Sig  . allopurinol (ZYLOPRIM) 300 MG tablet TAKE 1 TABLET ONCE DAILY. FOR GOUT  . Cholecalciferol ([employer] VIT D 5000 HIGH-POTENCY PO) Take 5,000 Units by mouth daily.   . cyanocobalamin 500 MCG tablet Take daily.  Marland Kitchen dexlansoprazole (DEXILANT) 60 Mg Take 1 capsule  daily.  Marland Kitchen diltiazem  CD 240 MG 24 hr capsule Take 1 capsule  daily.  . fenofibrate  134 MG capsule TAKE 1 CAPSULE ONCE DAILY FOR BLOOD FATS.  Marland Kitchen BREO ELLIPTA 100-25  Inhale 1 puff into the lungs daily.   . furosemide ( 20 MG tablet Take 1 tablet  3  times daily.  . Magnesium 250 MG TABS Take daily.  . metoprolol succinate -XL) 100 MG  Take 1 tablet  daily.   . montelukast  10 MG tablet Take 1 tablet at bedtime.  . CENTRUM Take 1 tablet  daily.   . ondansetron (ZOFRAN-ODT) 4 MG  Take 4 mg  every 6  hours as needed  . KCl (K-DUR) 10 MEQ tablet Take 1 tablet b 2 times daily.  Marland Kitchen PROAIR HFA  inhaler Inhale 2 puffs into the lungs every 6 hours as needed   . rosuvastatin  5 MG tablet Take 5 mg  at bedtime.   . vitamin C  500 MG tablet Take 500 mg daily.  Marland Kitchen warfarin  5 MG tablet Take 5 mg  daily.   Allergies  Allergen Reactions  . Hctz [Hydrochlorothiazide]   . Sulfa Antibiotics   . Tetracyclines & Related    Past Medical History  Diagnosis Date  . Hyperlipidemia   . Gout   . History of small bowel obstruction   . COPD (chronic obstructive pulmonary disease)   . Substance abuse     Tobacco  . Depression   . Pulmonary hypertension   . Umbilical hernia   . Bronchitis   . Atrial fibrillation   .  High cholesterol   . Pneumonia   . Arthritis   . Abdominal aortic aneurysm     followed by Dr Kellie Simmering- 4.8 cm per [place] 6/12- [facility]  . Complication of anesthesia     slow to wake up per pt- anesthesia record on chart  . Hypertension   . Anxiety   . Atherosclerosis of aorta    Health Maintenance  Topic Date Due  . Foot Exam  07/12/1941  . Ophthalmology Exam  07/12/1941  . Tetanus/tdap  07/12/1950  . Colonoscopy  07/12/1981  . Zostavax  07/13/1991  . Influenza Vaccine  02/07/2014  . Hemoglobin A1c  08/22/2014  . Urine Microalbumin  02/20/2015  . Pneumococcal Polysaccharide Vaccine Age 78 And Over  Completed   Immunization History  Administered Date(s) Administered  . DTaP 07/11/2003  . Influenza Whole  05/06/2013  . Influenza, High Dose Seasonal PF 05/01/2014  . Pneumococcal Conjugate-13 05/01/2014  . Pneumococcal Polysaccharide-23 11/27/2012   Past Surgical History  Procedure Laterality Date  . Small intestine surgery  05/2010  . Abdominal hysterectomy      Vaginal  . Hernia repair  05/2010    incarcerated Umbilical hernia  . Tonsillectomy    . Incisional hernia repair  02/21/2012    Procedure: LAPAROSCOPIC INCISIONAL HERNIA;  Surgeon: Harl Bowie, MD;  Location: [facility];  Service: General;  Laterality: N/A;   Family History  Problem Relation Age of Onset  . Cancer Mother   . Heart disease Father   . Cancer Daughter   . Deep vein thrombosis Daughter    History  Substance Use Topics  . Smoking status: Current Every Day Smoker -- 0.50 packs/day for 50 years    Types: Cigarettes  . Smokeless tobacco: Never Used  . Alcohol Use: No    ROS Constitutional: Denies fever, chills, weight loss/gain, headaches, insomnia, fatigue, night sweats, and change in appetite. Eyes: Denies redness, blurred vision, diplopia, discharge, itchy, watery eyes.  ENT: Denies discharge, congestion, post nasal drip, epistaxis, sore throat, earache, hearing loss, dental pain, Tinnitus, Vertigo, Sinus pain, snoring.  Cardio: Denies chest pain, palpitations, irregular heartbeat, syncope, dyspnea, diaphoresis, orthopnea, PND, claudication, edema Respiratory: denies cough, dyspnea, DOE, pleurisy, hoarseness, laryngitis, wheezing.  Gastrointestinal: Denies dysphagia, heartburn, reflux, water brash, pain, cramps, nausea, vomiting, bloating, diarrhea, constipation, hematemesis, melena, hematochezia, jaundice, hemorrhoids Genitourinary: Denies dysuria, frequency, urgency, nocturia, hesitancy, discharge, hematuria, flank pain Breast: Breast lumps, nipple discharge, bleeding.  Musculoskeletal: Denies arthralgia, myalgia, stiffness, Jt. Swelling, pain, limp, and strain/sprain. Denies falls. Skin: Denies puritis,  rash, hives, warts, acne, eczema, changing in skin lesion Neuro: No weakness, tremor, incoordination, spasms, paresthesia, pain Psychiatric: Denies confusion, memory loss, sensory loss. Denies Depression. Endocrine: Denies change in weight, skin, hair change, nocturia, and paresthesia, diabetic polys, visual blurring, hyper / hypo glycemic episodes.  Heme/Lymph: No excessive bleeding, bruising, enlarged lymph nodes.  Physical Exam  BP 108/64  Pulse 64  Temp(Src) 97.5 F (36.4 C) (Temporal)  Resp 16  Ht 5' 5.25" (1.657 m)  Wt 216 lb 3.2 oz (98.068 kg)  BMI 35.72 kg/m2  General Appearance: Over nourished and in no apparent distress. Eyes: PERRLA, EOMs, conjunctiva no swelling or erythema, normal fundi and vessels. Sinuses: No frontal/maxillary tenderness ENT/Mouth: EACs patent / TMs  nl. Nares clear without erythema, swelling, mucoid exudates. Oral hygiene is good. No erythema, swelling, or exudate. Tongue normal, non-obstructing. Tonsils not swollen or erythematous. Hearing normal.  Neck: Supple, thyroid normal. No bruits, nodes or JVD. Respiratory: Respiratory effort normal.  BS equal and clear bilateral without rales, rhonci, wheezing or stridor. Cardio: Heart sounds are decreased withan  irregular rate and rhythm and no murmurs, rubs or gallops. Peripheral pulses are 1(+)  and equal bilaterally without edema. No aortic or femoral bruits. Chest: symmetric with normal excursions and percussion. Breasts: Symmetric, without lumps, nipple discharge, retractions, or fibrocystic changes.  Abdomen: Flat, soft, with bowl sounds. Nontender, no guarding, rebound, hernias, masses, or organomegaly.  Lymphatics: Non tender without lymphadenopathy.  Genitourinary:  Musculoskeletal: Full ROM all peripheral extremities, joint stability, 5/5 strength, and normal gait. Skin: Warm and dry without rashes, lesions, cyanosis, clubbing or  ecchymosis.  Neuro: Cranial nerves intact, reflexes equal  bilaterally. Normal muscle tone, no cerebellar symptoms. Sensation intact.  Pysch: Awake and oriented X 3, normal affect, Insight and Judgment appropriate.  Assessment and Plan  1. Encounter for general adult medical examination with abnormal findings   2. Essential hypertension  - Microalbumin / creatinine urine ratio - EKG 12-Lead - TSH  3. ASHD w/Diastolic Heart Failure, hx   4. Atrial fibrillation with RVR  - Continue Anti- Coagulation  5. Chronic obstructive pulmonary disease  - Encouraged smoking cessation  6. Hyperlipidemia  - Continue Meds  7. Type 2 diabetes mellitus with diabetic chronic kidney disease  - HM DIABETES FOOT EXAM - LOW EXTREMITY NEUR EXAM DOCUM  8. Vitamin D deficiency   - Continue supplements  9. Abdominal aortic aneurysm  - patient adamantly refuses to consider surgery or stenting  10. Medication management  - Urine Microscopic - CBC with Differential - BASIC METABOLIC PANEL WITH GFR - Hepatic function panel - Magnesium  11. Screening for rectal cancer  - POC Hemoccult Bld/Stl (3-Cd Home Screen); Future  12. Long term current use of anticoagulant therapy  - Protime-INR  13. At low risk for fall   14. Depression screen  - Trial on Citalopram  15. B12 deficiency, hx  - Recheck Vitamin B12  16. Need for prophylactic vaccination and inoculation against influenza  - Flu vaccine HIGH DOSE PF (Fluzone Tri High dose)  17. Need for prophylactic vaccination against Streptococcus pneumoniae (pneumococcus)  - Pneumococcal conjugate vaccine 13-valent  18. Morbid Obesity (BMI 38.76)   - encouraged better diet & weight loss     Continue prudent diet as discussed, weight control, BP monitoring, regular exercise, and medications. Discussed med's effects and SE's. Screening labs and tests as requested with regular follow-up as recommended.

## 2014-04-30 NOTE — Patient Instructions (Signed)
Recommend the book "The END of DIETING" by Dr Baker Janus   and the book "The END of DIABETES " by Dr Excell Seltzer  At [email or web address] - get book & Audio CD's     Being diabetic has a  300% increased risk for heart attack, stroke, cancer, and alzheimer- type vascular dementia. It is very important that you work harder with diet by avoiding all foods that are white except chicken & fish. Avoid white rice (brown & wild rice is OK), white potatoes (sweetpotatoes in moderation is OK), White bread or wheat bread or anything made out of white flour like bagels, donuts, rolls, buns, biscuits, cakes, pastries, cookies, pizza crust, and pasta (made from white flour & egg whites) - vegetarian pasta or spinach or wheat pasta is OK. Multigrain breads like Arnold's or Pepper[employer], or multigrain sandwich thins or flatbreads.  Diet, exercise and weight loss can reverse and cure diabetes in the early stages.  Diet, exercise and weight loss is very important in the control and prevention of complications of diabetes which affects every system in your body, ie. Brain - dementia/stroke, eyes - glaucoma/blindness, heart - heart attack/heart failure, kidneys - dialysis, stomach - gastric paralysis, intestines - malabsorption, nerves - severe painful neuritis, circulation - gangrene & loss of a leg(s), and finally cancer and Alzheimers.    I recommend avoid fried & greasy foods,  sweets/candy, white rice (brown or wild rice or Quinoa is OK), white potatoes (sweet potatoes are OK) - anything made from white flour - bagels, doughnuts, rolls, buns, biscuits,white and wheat breads, pizza crust and traditional pasta made of white flour & egg white(vegetarian pasta or spinach or wheat pasta is OK).  Multi-grain bread is OK - like multi-grain flat bread or sandwich thins. Avoid alcohol in excess. Exercise is also important.    Eat all the vegetables you want - avoid meat, especially red meat and dairy - especially cheese.  Cheese is  the most concentrated form of trans-fats which is the worst thing to clog up our arteries. Veggie cheese is OK which can be found in the fresh produce section at [employer] or [employer] or [employer]  Smoking Cessation Quitting smoking is important to your health and has many advantages. However, it is not always easy to quit since nicotine is a very addictive drug. Oftentimes, people try 3 times or more before being able to quit. This document explains the best ways for you to prepare to quit smoking. Quitting takes hard work and a lot of effort, but you can do it. ADVANTAGES OF QUITTING SMOKING  You will live longer, feel better, and live better.  Your body will feel the impact of quitting smoking almost immediately.  Within 20 minutes, blood pressure decreases. Your pulse returns to its normal level.  After 8 hours, carbon monoxide levels in the blood return to normal. Your oxygen level increases.  After 24 hours, the chance of having a heart attack starts to decrease. Your breath, hair, and body stop smelling like smoke.  After 48 hours, damaged nerve endings begin to recover. Your sense of taste and smell improve.  After 72 hours, the body is virtually free of nicotine. Your bronchial tubes relax and breathing becomes easier.  After 2 to 12 weeks, lungs can hold more air. Exercise becomes easier and circulation improves.  The risk of having a heart attack, stroke, cancer, or lung disease is greatly reduced.  After 1 year, the risk of coronary heart disease is  cut in half.  After 5 years, the risk of stroke falls to the same as a nonsmoker.  After 10 years, the risk of lung cancer is cut in half and the risk of other cancers decreases significantly.  After 15 years, the risk of coronary heart disease drops, usually to the level of a nonsmoker.  If you are pregnant, quitting smoking will improve your chances of having a healthy baby.  The people you live with, especially any  children, will be healthier.  You will have extra money to spend on things other than cigarettes. QUESTIONS TO THINK ABOUT BEFORE ATTEMPTING TO QUIT You may want to talk about your answers with your health care provider.  Why do you want to quit?  If you tried to quit in the past, what helped and what did not?  What will be the most difficult situations for you after you quit? How will you plan to handle them?  Who can help you through the tough times? Your family? Friends? A health care provider?  What pleasures do you get from smoking? What ways can you still get pleasure if you quit? Here are some questions to ask your health care provider:  How can you help me to be successful at quitting?  What medicine do you think would be best for me and how should I take it?  What should I do if I need more help?  What is smoking withdrawal like? How can I get information on withdrawal? GET READY  Set a quit date.  Change your environment by getting rid of all cigarettes, ashtrays, matches, and lighters in your home, car, or work. Do not let people smoke in your home.  Review your past attempts to quit. Think about what worked and what did not. GET SUPPORT AND ENCOURAGEMENT You have a better chance of being successful if you have help. You can get support in many ways.  Tell your family, friends, and coworkers that you are going to quit and need their support. Ask them not to smoke around you.  Get individual, group, or telephone counseling and support. Programs are available at [employer] and health centers. Call your local health department for information about programs in your area.  Spiritual beliefs and practices may help some smokers quit.  Download a "quit meter" on your computer to keep track of quit statistics, such as how long you have gone without smoking, cigarettes not smoked, and money saved.  Get a self-help book about quitting smoking and staying off  tobacco. [place] yourself from urges to smoke. Talk to someone, go for a walk, or occupy your time with a task.  Change your normal routine. Take a different route to work. Drink tea instead of coffee. Eat breakfast in a different place.  Reduce your stress. Take a hot bath, exercise, or read a book.  Plan something enjoyable to do every day. Reward yourself for not smoking.  Explore interactive web-based programs that specialize in helping you quit. GET MEDICINE AND USE IT CORRECTLY Medicines can help you stop smoking and decrease the urge to smoke. Combining medicine with the above behavioral methods and support can greatly increase your chances of successfully quitting smoking.  Nicotine replacement therapy helps deliver nicotine to your body without the negative effects and risks of smoking. Nicotine replacement therapy includes nicotine gum, lozenges, inhalers, nasal sprays, and skin patches. Some may be available over-the-counter and others require a prescription.  Antidepressant  medicine helps people abstain from smoking, but how this works is unknown. This medicine is available by prescription.  Nicotinic receptor partial agonist medicine simulates the effect of nicotine in your brain. This medicine is available by prescription. Ask your health care provider for advice about which medicines to use and how to use them based on your health history. Your health care provider will tell you what side effects to look out for if you choose to be on a medicine or therapy. Carefully read the information on the package. Do not use any other product containing nicotine while using a nicotine replacement product.  RELAPSE OR DIFFICULT SITUATIONS Most relapses occur within the first 3 months after quitting. Do not be discouraged if you start smoking again. Remember, most people try several times before finally quitting. You may have symptoms of withdrawal because  your body is used to nicotine. You may crave cigarettes, be irritable, feel very hungry, cough often, get headaches, or have difficulty concentrating. The withdrawal symptoms are only temporary. They are strongest when you first quit, but they will go away within 10-14 days. To reduce the chances of relapse, try to:  Avoid drinking alcohol. Drinking lowers your chances of successfully quitting.  Reduce the amount of caffeine you consume. Once you quit smoking, the amount of caffeine in your body increases and can give you symptoms, such as a rapid heartbeat, sweating, and anxiety.  Avoid smokers because they can make you want to smoke.  Do not let weight gain distract you. Many smokers will gain weight when they quit, usually less than 10 pounds. Eat a healthy diet and stay active. You can always lose the weight gained after you quit.  Find ways to improve your mood other than smoking. FOR MORE INFORMATION  www.smokefree.gov  Document Released: 06/20/2001 Document Revised: 11/10/2013 Document Reviewed: 10/05/2011 [facility] Patient Information 2015 [place], [place]. This information is not intended to replace advice given to you by your health care provider. Make sure you discuss any questions you have with your health care provider.

## 2014-05-01 ENCOUNTER — Ambulatory Visit (INDEPENDENT_AMBULATORY_CARE_PROVIDER_SITE_OTHER): Payer: Medicare Other | Admitting: Internal Medicine

## 2014-05-01 ENCOUNTER — Encounter: Payer: Self-pay | Admitting: Internal Medicine

## 2014-05-01 VITALS — BP 108/64 | HR 64 | Temp 97.5°F | Resp 16 | Ht 65.25 in | Wt 216.2 lb

## 2014-05-01 DIAGNOSIS — I4891 Unspecified atrial fibrillation: Secondary | ICD-10-CM

## 2014-05-01 DIAGNOSIS — E1122 Type 2 diabetes mellitus with diabetic chronic kidney disease: Secondary | ICD-10-CM

## 2014-05-01 DIAGNOSIS — E785 Hyperlipidemia, unspecified: Secondary | ICD-10-CM

## 2014-05-01 DIAGNOSIS — E538 Deficiency of other specified B group vitamins: Secondary | ICD-10-CM

## 2014-05-01 DIAGNOSIS — Z0001 Encounter for general adult medical examination with abnormal findings: Secondary | ICD-10-CM

## 2014-05-01 DIAGNOSIS — F329 Major depressive disorder, single episode, unspecified: Secondary | ICD-10-CM

## 2014-05-01 DIAGNOSIS — Z1212 Encounter for screening for malignant neoplasm of rectum: Secondary | ICD-10-CM

## 2014-05-01 DIAGNOSIS — I714 Abdominal aortic aneurysm, without rupture, unspecified: Secondary | ICD-10-CM

## 2014-05-01 DIAGNOSIS — F32A Depression, unspecified: Secondary | ICD-10-CM

## 2014-05-01 DIAGNOSIS — J449 Chronic obstructive pulmonary disease, unspecified: Secondary | ICD-10-CM

## 2014-05-01 DIAGNOSIS — Z23 Encounter for immunization: Secondary | ICD-10-CM

## 2014-05-01 DIAGNOSIS — E559 Vitamin D deficiency, unspecified: Secondary | ICD-10-CM

## 2014-05-01 DIAGNOSIS — R6889 Other general symptoms and signs: Secondary | ICD-10-CM

## 2014-05-01 DIAGNOSIS — Z9181 History of falling: Secondary | ICD-10-CM

## 2014-05-01 DIAGNOSIS — I10 Essential (primary) hypertension: Secondary | ICD-10-CM

## 2014-05-01 DIAGNOSIS — Z7901 Long term (current) use of anticoagulants: Secondary | ICD-10-CM

## 2014-05-01 DIAGNOSIS — I2510 Atherosclerotic heart disease of native coronary artery without angina pectoris: Secondary | ICD-10-CM

## 2014-05-01 DIAGNOSIS — Z79899 Other long term (current) drug therapy: Secondary | ICD-10-CM

## 2014-05-01 DIAGNOSIS — Z1331 Encounter for screening for depression: Secondary | ICD-10-CM

## 2014-05-01 LAB — CBC WITH DIFFERENTIAL/PLATELET
BASOS ABS: 0.1 10*3/uL (ref 0.0–0.1)
Basophils Relative: 1 % (ref 0–1)
EOS ABS: 0.2 10*3/uL (ref 0.0–0.7)
Eosinophils Relative: 2 % (ref 0–5)
HCT: 45.3 % (ref 36.0–46.0)
HEMOGLOBIN: 15 g/dL (ref 12.0–15.0)
Lymphocytes Relative: 23 % (ref 12–46)
Lymphs Abs: 1.7 10*3/uL (ref 0.7–4.0)
MCH: 30.1 pg (ref 26.0–34.0)
MCHC: 33.1 g/dL (ref 30.0–36.0)
MCV: 91 fL (ref 78.0–100.0)
MONOS PCT: 7 % (ref 3–12)
Monocytes Absolute: 0.5 10*3/uL (ref 0.1–1.0)
Neutro Abs: 5.1 10*3/uL (ref 1.7–7.7)
Neutrophils Relative %: 67 % (ref 43–77)
Platelets: 319 10*3/uL (ref 150–400)
RBC: 4.98 MIL/uL (ref 3.87–5.11)
RDW: 15.6 % — AB (ref 11.5–15.5)
WBC: 7.6 10*3/uL (ref 4.0–10.5)

## 2014-05-01 LAB — HEPATIC FUNCTION PANEL
ALT: 16 U/L (ref 0–35)
AST: 24 U/L (ref 0–37)
Albumin: 3.9 g/dL (ref 3.5–5.2)
Alkaline Phosphatase: 82 U/L (ref 39–117)
Bilirubin, Direct: 0.3 mg/dL (ref 0.0–0.3)
Indirect Bilirubin: 0.5 mg/dL (ref 0.2–1.2)
TOTAL PROTEIN: 6.9 g/dL (ref 6.0–8.3)
Total Bilirubin: 0.8 mg/dL (ref 0.2–1.2)

## 2014-05-01 LAB — BASIC METABOLIC PANEL WITH GFR
BUN: 29 mg/dL — AB (ref 6–23)
CALCIUM: 9.6 mg/dL (ref 8.4–10.5)
CO2: 25 mEq/L (ref 19–32)
Chloride: 100 mEq/L (ref 96–112)
Creat: 1.52 mg/dL — ABNORMAL HIGH (ref 0.50–1.10)
GFR, EST AFRICAN AMERICAN: 37 mL/min — AB
GFR, EST NON AFRICAN AMERICAN: 32 mL/min — AB
GLUCOSE: 85 mg/dL (ref 70–99)
Potassium: 3.7 mEq/L (ref 3.5–5.3)
Sodium: 137 mEq/L (ref 135–145)

## 2014-05-01 LAB — VITAMIN B12: VITAMIN B 12: 604 pg/mL (ref 211–911)

## 2014-05-01 LAB — TSH: TSH: 1.921 u[IU]/mL (ref 0.350–4.500)

## 2014-05-01 LAB — MAGNESIUM: MAGNESIUM: 2.1 mg/dL (ref 1.5–2.5)

## 2014-05-01 MED ORDER — CITALOPRAM HYDROBROMIDE 20 MG PO TABS: 20.0000 mg | ORAL_TABLET | Freq: Every day | ORAL | Status: AC

## 2014-05-02 LAB — URINALYSIS, MICROSCOPIC ONLY: Crystals: NONE SEEN

## 2014-05-02 LAB — MICROALBUMIN / CREATININE URINE RATIO
CREATININE, URINE: 113.7 mg/dL
MICROALB UR: 67.5 mg/dL — AB (ref <2.0)
MICROALB/CREAT RATIO: 593.7 mg/g — AB (ref 0.0–30.0)

## 2014-05-02 LAB — PROTIME-INR
INR: 2.45 — AB (ref <1.50)
PROTHROMBIN TIME: 26.6 s — AB (ref 11.6–15.2)

## 2014-05-05 ENCOUNTER — Telehealth: Payer: Self-pay | Admitting: *Deleted

## 2014-05-05 NOTE — Telephone Encounter (Signed)
Spoke with patient's daughter about labs.Per Dr Melford Aase, continue same dose of Coumidin and drink lots of fluids and all other labs OK.

## 2014-05-12 ENCOUNTER — Ambulatory Visit: Payer: Self-pay | Admitting: Emergency Medicine

## 2014-05-15 ENCOUNTER — Observation Stay (HOSPITAL_COMMUNITY): Payer: Medicare Other

## 2014-05-15 ENCOUNTER — Inpatient Hospital Stay (HOSPITAL_COMMUNITY)
Admission: EM | Admit: 2014-05-15 | Discharge: 2014-05-20 | DRG: 551 | Disposition: A | Discharge status: Skilled Nursing Facility | Payer: Medicare Other | Attending: Family Medicine | Admitting: Family Medicine

## 2014-05-15 ENCOUNTER — Emergency Department (HOSPITAL_COMMUNITY): Payer: Medicare Other

## 2014-05-15 ENCOUNTER — Encounter (HOSPITAL_COMMUNITY): Payer: Self-pay

## 2014-05-15 DIAGNOSIS — Z8249 Family history of ischemic heart disease and other diseases of the circulatory system: Secondary | ICD-10-CM

## 2014-05-15 DIAGNOSIS — M25559 Pain in unspecified hip: Secondary | ICD-10-CM | POA: Diagnosis present

## 2014-05-15 DIAGNOSIS — Z881 Allergy status to other antibiotic agents status: Secondary | ICD-10-CM

## 2014-05-15 DIAGNOSIS — W010XXA Fall on same level from slipping, tripping and stumbling without subsequent striking against object, initial encounter: Secondary | ICD-10-CM | POA: Diagnosis present

## 2014-05-15 DIAGNOSIS — S22089A Unspecified fracture of T11-T12 vertebra, initial encounter for closed fracture: Principal | ICD-10-CM | POA: Diagnosis present

## 2014-05-15 DIAGNOSIS — I714 Abdominal aortic aneurysm, without rupture, unspecified: Secondary | ICD-10-CM

## 2014-05-15 DIAGNOSIS — Z7901 Long term (current) use of anticoagulants: Secondary | ICD-10-CM

## 2014-05-15 DIAGNOSIS — M549 Dorsalgia, unspecified: Secondary | ICD-10-CM

## 2014-05-15 DIAGNOSIS — I951 Orthostatic hypotension: Secondary | ICD-10-CM

## 2014-05-15 DIAGNOSIS — I129 Hypertensive chronic kidney disease with stage 1 through stage 4 chronic kidney disease, or unspecified chronic kidney disease: Secondary | ICD-10-CM | POA: Diagnosis present

## 2014-05-15 DIAGNOSIS — E559 Vitamin D deficiency, unspecified: Secondary | ICD-10-CM

## 2014-05-15 DIAGNOSIS — R29890 Loss of height: Secondary | ICD-10-CM | POA: Diagnosis present

## 2014-05-15 DIAGNOSIS — S22080A Wedge compression fracture of T11-T12 vertebra, initial encounter for closed fracture: Secondary | ICD-10-CM | POA: Diagnosis present

## 2014-05-15 DIAGNOSIS — E119 Type 2 diabetes mellitus without complications: Secondary | ICD-10-CM | POA: Diagnosis present

## 2014-05-15 DIAGNOSIS — E780 Pure hypercholesterolemia: Secondary | ICD-10-CM | POA: Diagnosis present

## 2014-05-15 DIAGNOSIS — E871 Hypo-osmolality and hyponatremia: Secondary | ICD-10-CM | POA: Diagnosis present

## 2014-05-15 DIAGNOSIS — E1129 Type 2 diabetes mellitus with other diabetic kidney complication: Secondary | ICD-10-CM | POA: Diagnosis present

## 2014-05-15 DIAGNOSIS — E669 Obesity, unspecified: Secondary | ICD-10-CM | POA: Diagnosis present

## 2014-05-15 DIAGNOSIS — J9601 Acute respiratory failure with hypoxia: Secondary | ICD-10-CM

## 2014-05-15 DIAGNOSIS — M545 Low back pain: Secondary | ICD-10-CM

## 2014-05-15 DIAGNOSIS — M1990 Unspecified osteoarthritis, unspecified site: Secondary | ICD-10-CM | POA: Diagnosis present

## 2014-05-15 DIAGNOSIS — R296 Repeated falls: Secondary | ICD-10-CM | POA: Diagnosis present

## 2014-05-15 DIAGNOSIS — R0600 Dyspnea, unspecified: Secondary | ICD-10-CM

## 2014-05-15 DIAGNOSIS — I4891 Unspecified atrial fibrillation: Secondary | ICD-10-CM

## 2014-05-15 DIAGNOSIS — E785 Hyperlipidemia, unspecified: Secondary | ICD-10-CM

## 2014-05-15 DIAGNOSIS — F17210 Nicotine dependence, cigarettes, uncomplicated: Secondary | ICD-10-CM | POA: Diagnosis present

## 2014-05-15 DIAGNOSIS — IMO0002 Reserved for concepts with insufficient information to code with codable children: Secondary | ICD-10-CM

## 2014-05-15 DIAGNOSIS — J449 Chronic obstructive pulmonary disease, unspecified: Secondary | ICD-10-CM | POA: Diagnosis present

## 2014-05-15 DIAGNOSIS — N184 Chronic kidney disease, stage 4 (severe): Secondary | ICD-10-CM

## 2014-05-15 DIAGNOSIS — E860 Dehydration: Secondary | ICD-10-CM | POA: Diagnosis present

## 2014-05-15 DIAGNOSIS — Z888 Allergy status to other drugs, medicaments and biological substances status: Secondary | ICD-10-CM

## 2014-05-15 DIAGNOSIS — S22080D Wedge compression fracture of T11-T12 vertebra, subsequent encounter for fracture with routine healing: Secondary | ICD-10-CM

## 2014-05-15 DIAGNOSIS — Z79899 Other long term (current) drug therapy: Secondary | ICD-10-CM

## 2014-05-15 DIAGNOSIS — I10 Essential (primary) hypertension: Secondary | ICD-10-CM | POA: Diagnosis present

## 2014-05-15 DIAGNOSIS — I2510 Atherosclerotic heart disease of native coronary artery without angina pectoris: Secondary | ICD-10-CM

## 2014-05-15 DIAGNOSIS — I5022 Chronic systolic (congestive) heart failure: Secondary | ICD-10-CM | POA: Diagnosis present

## 2014-05-15 DIAGNOSIS — M109 Gout, unspecified: Secondary | ICD-10-CM | POA: Diagnosis present

## 2014-05-15 DIAGNOSIS — Z720 Tobacco use: Secondary | ICD-10-CM | POA: Diagnosis present

## 2014-05-15 DIAGNOSIS — R55 Syncope and collapse: Secondary | ICD-10-CM | POA: Diagnosis present

## 2014-05-15 DIAGNOSIS — F419 Anxiety disorder, unspecified: Secondary | ICD-10-CM | POA: Diagnosis present

## 2014-05-15 DIAGNOSIS — K219 Gastro-esophageal reflux disease without esophagitis: Secondary | ICD-10-CM | POA: Diagnosis present

## 2014-05-15 DIAGNOSIS — W19XXXA Unspecified fall, initial encounter: Secondary | ICD-10-CM

## 2014-05-15 DIAGNOSIS — Z809 Family history of malignant neoplasm, unspecified: Secondary | ICD-10-CM

## 2014-05-15 DIAGNOSIS — E1122 Type 2 diabetes mellitus with diabetic chronic kidney disease: Secondary | ICD-10-CM

## 2014-05-15 DIAGNOSIS — Z8719 Personal history of other diseases of the digestive system: Secondary | ICD-10-CM

## 2014-05-15 DIAGNOSIS — M4854XA Collapsed vertebra, not elsewhere classified, thoracic region, initial encounter for fracture: Secondary | ICD-10-CM

## 2014-05-15 DIAGNOSIS — I272 Other secondary pulmonary hypertension: Secondary | ICD-10-CM | POA: Diagnosis present

## 2014-05-15 DIAGNOSIS — Z6838 Body mass index (BMI) 38.0-38.9, adult: Secondary | ICD-10-CM

## 2014-05-15 HISTORY — DX: Unspecified systolic (congestive) heart failure: I50.20

## 2014-05-15 LAB — URINALYSIS, ROUTINE W REFLEX MICROSCOPIC
Bilirubin Urine: NEGATIVE
Glucose, UA: NEGATIVE mg/dL
Hgb urine dipstick: NEGATIVE
Ketones, ur: NEGATIVE mg/dL
LEUKOCYTES UA: NEGATIVE
Nitrite: NEGATIVE
Protein, ur: NEGATIVE mg/dL
SPECIFIC GRAVITY, URINE: 1.02 (ref 1.005–1.030)
UROBILINOGEN UA: 1 mg/dL (ref 0.0–1.0)
pH: 6 (ref 5.0–8.0)

## 2014-05-15 LAB — CBC WITH DIFFERENTIAL/PLATELET
Basophils Absolute: 0 10*3/uL (ref 0.0–0.1)
Basophils Relative: 0 % (ref 0–1)
EOS ABS: 0 10*3/uL (ref 0.0–0.7)
Eosinophils Relative: 0 % (ref 0–5)
HEMATOCRIT: 39.4 % (ref 36.0–46.0)
HEMOGLOBIN: 13.2 g/dL (ref 12.0–15.0)
LYMPHS ABS: 1.4 10*3/uL (ref 0.7–4.0)
Lymphocytes Relative: 14 % (ref 12–46)
MCH: 31.1 pg (ref 26.0–34.0)
MCHC: 33.5 g/dL (ref 30.0–36.0)
MCV: 92.7 fL (ref 78.0–100.0)
MONO ABS: 0.6 10*3/uL (ref 0.1–1.0)
MONOS PCT: 6 % (ref 3–12)
NEUTROS PCT: 80 % — AB (ref 43–77)
Neutro Abs: 7.9 10*3/uL — ABNORMAL HIGH (ref 1.7–7.7)
Platelets: 234 10*3/uL (ref 150–400)
RBC: 4.25 MIL/uL (ref 3.87–5.11)
RDW: 15.1 % (ref 11.5–15.5)
WBC: 10 10*3/uL (ref 4.0–10.5)

## 2014-05-15 LAB — BASIC METABOLIC PANEL
Anion gap: 13 (ref 5–15)
BUN: 23 mg/dL (ref 6–23)
CHLORIDE: 94 meq/L — AB (ref 96–112)
CO2: 27 mEq/L (ref 19–32)
Calcium: 9.4 mg/dL (ref 8.4–10.5)
Creatinine, Ser: 1.23 mg/dL — ABNORMAL HIGH (ref 0.50–1.10)
GFR calc Af Amer: 46 mL/min — ABNORMAL LOW (ref >90)
GFR, EST NON AFRICAN AMERICAN: 40 mL/min — AB (ref >90)
GLUCOSE: 112 mg/dL — AB (ref 70–99)
POTASSIUM: 4.2 meq/L (ref 3.7–5.3)
SODIUM: 134 meq/L — AB (ref 137–147)

## 2014-05-15 LAB — PROTIME-INR
INR: 2.3 — AB (ref 0.00–1.49)
PROTHROMBIN TIME: 25.5 s — AB (ref 11.6–15.2)

## 2014-05-15 LAB — GLUCOSE, CAPILLARY
GLUCOSE-CAPILLARY: 125 mg/dL — AB (ref 70–99)
GLUCOSE-CAPILLARY: 128 mg/dL — AB (ref 70–99)

## 2014-05-15 MED ORDER — HYDROMORPHONE HCL 1 MG/ML IJ SOLN
0.5000 mg | Freq: Once | INTRAMUSCULAR | Status: AC
  Administered 2014-05-15: 0.5 mg via INTRAVENOUS
  Filled 2014-05-15: qty 1

## 2014-05-15 MED ORDER — VITAMIN D3 25 MCG (1000 UNIT) PO TABS
5000.0000 [IU] | ORAL_TABLET | Freq: Every day | ORAL | Status: DC
  Administered 2014-05-16 – 2014-05-20 (×4): 5000 [IU] via ORAL
  Filled 2014-05-15 (×5): qty 5

## 2014-05-15 MED ORDER — ALLOPURINOL 300 MG PO TABS
300.0000 mg | ORAL_TABLET | Freq: Every day | ORAL | Status: DC
  Administered 2014-05-16 – 2014-05-20 (×4): 300 mg via ORAL
  Filled 2014-05-15 (×6): qty 1

## 2014-05-15 MED ORDER — POTASSIUM CHLORIDE ER 10 MEQ PO TBCR
10.0000 meq | EXTENDED_RELEASE_TABLET | Freq: Two times a day (BID) | ORAL | Status: DC
  Administered 2014-05-15 – 2014-05-20 (×9): 10 meq via ORAL
  Filled 2014-05-15 (×11): qty 1

## 2014-05-15 MED ORDER — MORPHINE SULFATE 2 MG/ML IJ SOLN
2.0000 mg | INTRAMUSCULAR | Status: DC | PRN
  Administered 2014-05-16: 2 mg via INTRAVENOUS
  Filled 2014-05-15: qty 1

## 2014-05-15 MED ORDER — INSULIN ASPART 100 UNIT/ML ~~LOC~~ SOLN: 0.0000 [IU] | Freq: Every day | SUBCUTANEOUS | Status: DC

## 2014-05-15 MED ORDER — MAGNESIUM 250 MG PO TABS: 250.0000 mg | ORAL_TABLET | Freq: Every day | ORAL | Status: DC

## 2014-05-15 MED ORDER — MONTELUKAST SODIUM 10 MG PO TABS
10.0000 mg | ORAL_TABLET | Freq: Every day | ORAL | Status: DC
  Administered 2014-05-15 – 2014-05-19 (×5): 10 mg via ORAL
  Filled 2014-05-15 (×6): qty 1

## 2014-05-15 MED ORDER — DOCUSATE SODIUM 100 MG PO CAPS
100.0000 mg | ORAL_CAPSULE | Freq: Two times a day (BID) | ORAL | Status: DC
  Administered 2014-05-16 – 2014-05-20 (×9): 100 mg via ORAL
  Filled 2014-05-15 (×7): qty 1

## 2014-05-15 MED ORDER — CENTRUM PO TABS: 1.0000 | ORAL_TABLET | Freq: Every day | ORAL | Status: DC

## 2014-05-15 MED ORDER — ALBUTEROL SULFATE (2.5 MG/3ML) 0.083% IN NEBU: 2.5000 mg | INHALATION_SOLUTION | Freq: Four times a day (QID) | RESPIRATORY_TRACT | Status: DC | PRN

## 2014-05-15 MED ORDER — ADULT MULTIVITAMIN W/MINERALS CH
1.0000 | ORAL_TABLET | Freq: Every day | ORAL | Status: DC
  Administered 2014-05-16 – 2014-05-20 (×4): 1 via ORAL
  Filled 2014-05-15 (×6): qty 1

## 2014-05-15 MED ORDER — SODIUM CHLORIDE 0.9 % IV SOLN: INTRAVENOUS | Status: DC

## 2014-05-15 MED ORDER — INSULIN ASPART 100 UNIT/ML ~~LOC~~ SOLN
0.0000 [IU] | Freq: Three times a day (TID) | SUBCUTANEOUS | Status: DC
  Administered 2014-05-18 – 2014-05-19 (×2): 2 [IU] via SUBCUTANEOUS

## 2014-05-15 MED ORDER — FENOFIBRATE 160 MG PO TABS
160.0000 mg | ORAL_TABLET | Freq: Every day | ORAL | Status: DC
  Administered 2014-05-16 – 2014-05-20 (×4): 160 mg via ORAL
  Filled 2014-05-15 (×6): qty 1

## 2014-05-15 MED ORDER — ONDANSETRON HCL 4 MG PO TABS
4.0000 mg | ORAL_TABLET | Freq: Four times a day (QID) | ORAL | Status: DC | PRN
  Administered 2014-05-16: 4 mg via ORAL
  Filled 2014-05-15: qty 1

## 2014-05-15 MED ORDER — ROSUVASTATIN CALCIUM 5 MG PO TABS
5.0000 mg | ORAL_TABLET | Freq: Every day | ORAL | Status: DC
  Administered 2014-05-15 – 2014-05-19 (×5): 5 mg via ORAL
  Filled 2014-05-15 (×6): qty 1

## 2014-05-15 MED ORDER — FUROSEMIDE 20 MG PO TABS
20.0000 mg | ORAL_TABLET | Freq: Three times a day (TID) | ORAL | Status: DC
  Administered 2014-05-16 (×3): 20 mg via ORAL
  Filled 2014-05-15 (×8): qty 1

## 2014-05-15 MED ORDER — PANTOPRAZOLE SODIUM 40 MG PO TBEC
40.0000 mg | DELAYED_RELEASE_TABLET | Freq: Every day | ORAL | Status: DC
  Administered 2014-05-16 – 2014-05-20 (×4): 40 mg via ORAL
  Filled 2014-05-15 (×5): qty 1

## 2014-05-15 MED ORDER — DILTIAZEM HCL ER COATED BEADS 240 MG PO CP24
240.0000 mg | ORAL_CAPSULE | Freq: Every day | ORAL | Status: DC
  Administered 2014-05-16 – 2014-05-18 (×3): 240 mg via ORAL
  Filled 2014-05-15 (×3): qty 1

## 2014-05-15 MED ORDER — MAGNESIUM OXIDE 400 (241.3 MG) MG PO TABS
200.0000 mg | ORAL_TABLET | Freq: Every day | ORAL | Status: DC
  Administered 2014-05-16 – 2014-05-20 (×4): 200 mg via ORAL
  Filled 2014-05-15 (×5): qty 0.5

## 2014-05-15 MED ORDER — WARFARIN SODIUM 2.5 MG PO TABS
2.5000 mg | ORAL_TABLET | Freq: Once | ORAL | Status: AC
Start: 2014-05-15 — End: 2014-05-15
  Administered 2014-05-15: 2.5 mg via ORAL
  Filled 2014-05-15: qty 1

## 2014-05-15 MED ORDER — CITALOPRAM HYDROBROMIDE 20 MG PO TABS
20.0000 mg | ORAL_TABLET | Freq: Every day | ORAL | Status: DC
  Administered 2014-05-15 – 2014-05-20 (×5): 20 mg via ORAL
  Filled 2014-05-15 (×6): qty 1

## 2014-05-15 MED ORDER — OXYCODONE-ACETAMINOPHEN 5-325 MG PO TABS
1.0000 | ORAL_TABLET | Freq: Once | ORAL | Status: DC
  Filled 2014-05-15: qty 1

## 2014-05-15 MED ORDER — ONDANSETRON HCL 4 MG/2ML IJ SOLN: 4.0000 mg | Freq: Four times a day (QID) | INTRAMUSCULAR | Status: DC | PRN

## 2014-05-15 MED ORDER — CHOLECALCIFEROL 125 MCG (5000 UT) PO CAPS: 5000.0000 [IU] | ORAL_CAPSULE | Freq: Every day | ORAL | Status: DC

## 2014-05-15 MED ORDER — WARFARIN - PHARMACIST DOSING INPATIENT: Freq: Every day | Status: DC

## 2014-05-15 MED ORDER — METOPROLOL SUCCINATE ER 100 MG PO TB24
100.0000 mg | ORAL_TABLET | Freq: Every day | ORAL | Status: DC
  Administered 2014-05-16 – 2014-05-20 (×4): 100 mg via ORAL
  Filled 2014-05-15 (×5): qty 1

## 2014-05-15 NOTE — ED Provider Notes (Addendum)
CSN: [id number]     Arrival date & time 05/15/14  1308 History   First MD Initiated Contact with Patient 05/15/14 [phone]     Chief Complaint  Patient presents with  . Fall  . Flank Pain     (Consider location/radiation/quality/duration/timing/severity/associated sxs/prior Treatment) Patient is a 80 y.o. female presenting with fall.  Fall This is a recurrent problem. The current episode started 3 to 5 hours ago. The problem occurs constantly. The problem has not changed since onset.Associated symptoms include chest pain (L sided chest wall and hip). Pertinent negatives include no abdominal pain. Exacerbated by: movement, palpation. Nothing relieves the symptoms. She has tried nothing for the symptoms.    Past Medical History  Diagnosis Date  . Hyperlipidemia   . Gout   . History of small bowel obstruction   . COPD (chronic obstructive pulmonary disease)   . Substance abuse     Tobacco  . Depression   . Pulmonary hypertension   . Umbilical hernia   . Bronchitis   . Atrial fibrillation   . High cholesterol   . Pneumonia   . Arthritis   . Abdominal aortic aneurysm     followed by Dr Kellie Simmering- 4.8 cm per [place] 6/12- [facility]  . Complication of anesthesia     slow to wake up per pt- anesthesia record on chart  . Hypertension   . Anxiety   . Atherosclerosis of aorta    Past Surgical History  Procedure Laterality Date  . Small intestine surgery  05/2010  . Abdominal hysterectomy      Vaginal  . Hernia repair  05/2010    incarcerated Umbilical hernia  . Tonsillectomy    . Incisional hernia repair  02/21/2012    Procedure: LAPAROSCOPIC INCISIONAL HERNIA;  Surgeon: Harl Bowie, MD;  Location: [facility];  Service: General;  Laterality: N/A;   Family History  Problem Relation Age of Onset  . Cancer Mother   . Heart disease Father   . Cancer Daughter   . Deep vein thrombosis Daughter    History  Substance Use Topics  . Smoking status: Current Every Day Smoker -- 0.50 packs/day  for 50 years    Types: Cigarettes  . Smokeless tobacco: Never Used  . Alcohol Use: No   OB History    No data available     Review of Systems  Cardiovascular: Positive for chest pain (L sided chest wall and hip).  Gastrointestinal: Negative for abdominal pain.  All other systems reviewed and are negative.     Allergies  Hctz; Sulfa antibiotics; and Tetracyclines & related  Home Medications   Prior to Admission medications   Medication Sig Start Date End Date Taking? Authorizing Provider  acetaminophen (TYLENOL) 500 MG tablet Take 500 mg by mouth every 6 (six) hours as needed for moderate pain.   Yes Historical Provider, MD  allopurinol (ZYLOPRIM) 300 MG tablet Take 300 mg by mouth daily.   Yes Historical Provider, MD  Cholecalciferol ([employer] VIT D 5000 HIGH-POTENCY PO) Take 5,000 Units by mouth daily.    Yes Historical Provider, MD  citalopram (CELEXA) 20 MG tablet Take 1 tablet (20 mg total) by mouth daily. 05/01/14 05/01/15 Yes Unk Pinto, MD  dexlansoprazole (DEXILANT) 60 MG capsule Take 1 capsule (60 mg total) by mouth daily. 03/26/14  Yes Unk Pinto, MD  diltiazem (CARDIZEM CD) 240 MG 24 hr capsule Take 1 capsule (240 mg total) by mouth daily. 01/02/14  Yes Unk Pinto, MD  fenofibrate micronized (  LOFIBRA) 134 MG capsule Take 134 mg by mouth daily.   Yes Historical Provider, MD  furosemide (LASIX) 20 MG tablet Take 1 tablet (20 mg total) by mouth 3 (three) times daily. 02/10/14  Yes Vicie Mutters, PA-C  Magnesium 250 MG TABS Take 250 mg by mouth daily.   Yes Historical Provider, MD  metoprolol succinate (TOPROL-XL) 100 MG 24 hr tablet Take 1 tablet (100 mg total) by mouth daily. Take with or immediately following a meal. 01/02/14  Yes Unk Pinto, MD  montelukast (SINGULAIR) 10 MG tablet Take 1 tablet (10 mg total) by mouth at bedtime. 03/26/14  Yes Unk Pinto, MD  Multiple Vitamins-Minerals (CENTRUM) tablet Take 1 tablet by mouth daily.    Yes Historical  Provider, MD  potassium chloride (K-DUR) 10 MEQ tablet Take 1 tablet (10 mEq total) by mouth 2 (two) times daily. 04/09/14  Yes Unk Pinto, MD  rosuvastatin (CRESTOR) 5 MG tablet Take 5 mg by mouth at bedtime.    Yes Historical Provider, MD  warfarin (COUMADIN) 5 MG tablet Take 5 mg by mouth daily. Takes 1/2 tab 6 days a week and 1 tab on Wed   Yes Historical Provider, MD  allopurinol (ZYLOPRIM) 300 MG tablet TAKE 1 TABLET ONCE DAILY. FOR GOUT 01/16/14   Melissa R Smith, PA-C  fenofibrate micronized (LOFIBRA) 134 MG capsule TAKE 1 CAPSULE ONCE DAILY FOR BLOOD FATS. 01/16/14   Melissa R Smith, PA-C   BP 132/95 mmHg  Pulse 66  Temp(Src) 98.2 F (36.8 C) (Oral)  Resp 19  SpO2 87% Physical Exam  Constitutional: She is oriented to person, place, and time. She appears well-developed and well-nourished.  HENT:  Head: Normocephalic and atraumatic.  Right Ear: External ear normal.  Left Ear: External ear normal.  Eyes: Conjunctivae and EOM are normal. Pupils are equal, round, and reactive to light.  Neck: Normal range of motion. Neck supple.  Cardiovascular: Normal rate, regular rhythm, normal heart sounds and intact distal pulses.   Pulmonary/Chest: Effort normal and breath sounds normal. She exhibits tenderness and bony tenderness.  Abdominal: Soft. Bowel sounds are normal. There is no tenderness.  Musculoskeletal: Normal range of motion.       Left hip: She exhibits tenderness and bony tenderness. She exhibits normal range of motion and normal strength.       Thoracic back: She exhibits tenderness and bony tenderness.       Lumbar back: She exhibits tenderness and bony tenderness.  Neurological: She is alert and oriented to person, place, and time. She has normal strength. No cranial nerve deficit or sensory deficit. GCS eye subscore is 4. GCS verbal subscore is 5. GCS motor subscore is 6.  Skin: Skin is warm and dry.  Vitals reviewed.   ED Course  Procedures (including critical care  time) Labs Review Labs Reviewed  CBC WITH DIFFERENTIAL - Abnormal; Notable for the following:    Neutrophils Relative % 80 (*)    Neutro Abs 7.9 (*)    All other components within normal limits  BASIC METABOLIC PANEL - Abnormal; Notable for the following:    Sodium 134 (*)    Chloride 94 (*)    Glucose, Bld 112 (*)    Creatinine, Ser 1.23 (*)    GFR calc non Af Amer 40 (*)    GFR calc Af Amer 46 (*)    All other components within normal limits  URINALYSIS, ROUTINE W REFLEX MICROSCOPIC - Abnormal; Notable for the following:    Color, Urine  AMBER (*)    APPearance CLOUDY (*)    All other components within normal limits  PROTIME-INR - Abnormal; Notable for the following:    Prothrombin Time 25.5 (*)    INR 2.30 (*)    All other components within normal limits    Imaging Review Dg Ribs Unilateral W/chest Left  05/15/2014   CLINICAL DATA:  80 year old female fall 1 day ago with left posterior rib pain. Initial encounter.  EXAM: LEFT RIBS AND CHEST - 3+ VIEW  COMPARISON:  12/04/2013 and earlier.  FINDINGS: Supine AP view of the chest and multiple supine left rib obliques. Larger lung volumes. Stable cardiomegaly and mediastinal contours. Thoracic scoliosis and tortuous thoracic aorta with calcified plaque. No pneumothorax or pleural effusion identified. Chronic retrocardiac hypoventilation.  Bone mineralization is within normal limits for age. Rib marker placed at the left lateral tenth or eleventh rib level. No acute displaced rib fracture identified. The T12 vertebral body appears compressed (image 3) in this seems to be new since 11/09/2013.  IMPRESSION: 1. No acute displaced rib fracture identified. However, there is evidence of a T12 compression fracture which seems to be new since 12/06/2013. If specific therapy such as vertebroplasty is desired, lumbar MRI or whole-body bone scan would best determine acuity. 2. Cardiomegaly.  No acute cardiopulmonary abnormality.   Electronically Signed    By: Lars Pinks M.D.   On: 05/15/2014 11:14   Dg Thoracic Spine 2 View  05/15/2014   CLINICAL DATA:  Status post fall 1 day ago now with left sided upper and lower back pain  EXAM: THORACIC SPINE - 2 VIEW  COMPARISON:  Right rib series of today's date.  FINDINGS: There is reverse S-shaped thoracic scoliosis. There is mild loss of height of the body at T3 and of T12 there are no abnormal paravertebral soft tissue densities. On the lateral film airspace opacities obscure the mid upper thoracic spine. A metallic BB projects anterior to T12 on the lateral film.  IMPRESSION: There is mild loss of height of the bodies of T3 and T12 the T12 compression is new since a CT scan of December 31, 2012. The T3 compression is of uncertain age.  Further workup with MRI or nuclear bone scanning as previously recommended is available upon request.   Electronically Signed   By: David  [place]   On: 05/15/2014 13:26   Dg Lumbar Spine Complete  05/15/2014   CLINICAL DATA:  Status post fall 1 day ago now with upper and lower back pain, left-sided  EXAM: LUMBAR SPINE - COMPLETE 4+ VIEW  COMPARISON:  Sagittal reconstructed images of the lumbar spine from an abdominal pelvic CT scan of December 31, 2012.  FINDINGS: The lumbar vertebral bodies are preserved in height. There is partial compression of the body of T12. The lumbar intervertebral disc space heights are reasonably well maintained. There is calcification of the L5-S1 disc. There is no spondylolisthesis. There is facet joint hypertrophy at L4-5 and at L5-S1. The observed portions of the sacrum are normal.  IMPRESSION: There is no acute bony abnormality of the lumbar spine.   Electronically Signed   By: David  [place]   On: 05/15/2014 13:28   Dg Hip Complete Left  05/15/2014   CLINICAL DATA:  Fall 1 day ago.  EXAM: LEFT HIP - COMPLETE 2+ VIEW  COMPARISON:  None.  FINDINGS: There is no evidence of hip fracture or dislocation. There is no evidence of arthropathy or other focal bone  abnormality.  IMPRESSION:  Negative.   Electronically Signed   By: Franchot Gallo M.D.   On: 05/15/2014 11:13   Ct Head Wo Contrast  05/15/2014   CLINICAL DATA:  Left flank pain after a fall yesterday. Patient tripped and fell. Denies loss of consciousness. Second fall this week.  EXAM: CT HEAD WITHOUT CONTRAST  TECHNIQUE: Contiguous axial images were obtained from the base of the skull through the vertex without intravenous contrast.  COMPARISON:  None.  FINDINGS: There is moderate central and cortical atrophy. Periventricular white matter changes are consistent with small vessel disease. There is no evidence for hemorrhage, mass lesion, or acute infarction. Bone windows are unremarkable.  IMPRESSION: 1. Atrophy and small vessel disease. 2.  No evidence for acute intracranial abnormality.   Electronically Signed   By: Shon Hale M.D.   On: 05/15/2014 10:49     EKG Interpretation   Date/Time:  Friday May 15 2014 11:07:59 EST Ventricular Rate:  101 PR Interval:    QRS Duration: 88 QT Interval:  324 QTC Calculation: 420 R Axis:   -28 Text Interpretation:  Atrial fibrillation Borderline left axis deviation  Nonspecific repol abnormality, diffuse leads Confirmed by Debby Freiberg  [phone]) on 05/15/2014 11:27:39 AM      MDM   Final diagnoses:  Fall  Back pain    80 y.o. female with pertinent PMH of COPD, prior afib presents with recurrent falls with L sided chest wall and hip pain.  Patient states falls are purely mechanical and are a result of not walking with walker.  She frankly denies LOC or hit to head.  On arrival today vitals and physical exam as above with midline tenderness of thoracic spine.  No other signs of trauma.  No focal neuro deficits.   Consulted hospitalist for pain control as pt is unable to bear pain.  No central cord symptoms, good strength bilaterally.   Stable at time of transfer. Spoke with [facility] who recommend no bracing or acute therapy aside from pain control at  this time.  1. Fall   2. Back pain         Debby Freiberg, MD 05/15/14 [place], MD 05/15/14 [phone]

## 2014-05-15 NOTE — Progress Notes (Signed)
  CARE MANAGEMENT ED NOTE 05/15/2014  Patient:  JOURNEE, BOBROWSKI   Account Number:  [id number]  Date Initiated:  05/15/2014  Documentation initiated by:  Jackelyn Poling  Subjective/Objective Assessment:   80 yr old blue medicare c/o fall, flank pain from [place], c/o L flank pain after a fall yesterday Imaging shows compression fx     Subjective/Objective Assessment Detail:   pcp [place], [place]iam  Pt reports having a rolling walker  During second visit to pt there were 2 more female family members at bedside and pt was dozing in and out during assessment     Action/Plan:   ed cm spoke with pt & dtr, kim at bedside about home health services, DME, private duty services see notes below Cm spoke with Zoey at [employer] to inquire if a particular home health agency is used for residents Zoey states it is   Action/Plan Detail:   the residents choice Spoke with ED RN who states unable to get pt up to evaluate ambulation   Anticipated DC Date:  05/16/2014     Status Recommendation to Physician:   Result of Recommendation:    Other ED Services  Consult Working [place]  Other  Outpatient Services - Pt will follow up   [place]   Choice offered to / List presented to:  [name]          Status of service:  Completed, signed off  ED Comments:   ED Comments Detail:  CM reviewed in details medicare guidelines, home health ([place]) (length of stay in home, types of [facility] staff available, coverage, primary caregiver, up to 24 hrs before services may be started) and Private duty nursing (PDN-coverage, length of stay in the home types of staff available, out of pocket expense if no medicaid) CM provided family with a list of [place] home health agencies and PDN  Discussed pt to be further evaluated by unit therapists (PT/OT) for recommendation of level of care and share this with attending MD and unit CM if pt is  admitted

## 2014-05-15 NOTE — ED Notes (Signed)
Attempted to give PO Percocet, but Pt could not sit up.  This RN and another repositioned Pt w/o relief.  MD notified.

## 2014-05-15 NOTE — Progress Notes (Addendum)
ANTICOAGULATION CONSULT NOTE - Initial Consult  Pharmacy Consult for Warfarin Indication: atrial fibrillation  Allergies  Allergen Reactions  . Hctz [Hydrochlorothiazide]   . Sulfa Antibiotics   . Tetracyclines & Related     Patient Measurements: Height: 5' 5.25" (165.7 cm) Weight: 217 lb (98.431 kg) IBW/kg (Calculated) : 57.58  Vital Signs: Temp: 98.1 F (36.7 C) (11/06 1719) Temp Source: Axillary (11/06 1719) BP: 147/80 mmHg (11/06 1719) Pulse Rate: 90 (11/06 1719)  Labs:  Recent Labs  05/15/14 1122  HGB 13.2  HCT 39.4  PLT 234  LABPROT 25.5*  INR 2.30*  CREATININE 1.23*    Estimated Creatinine Clearance: 41.1 mL/min (by C-G formula based on Cr of 1.23).  Medical History: Past Medical History  Diagnosis Date  . Hyperlipidemia   . Gout   . History of small bowel obstruction   . COPD (chronic obstructive pulmonary disease)   . Substance abuse     Tobacco  . Depression   . Pulmonary hypertension   . Umbilical hernia   . Bronchitis   . Atrial fibrillation   . High cholesterol   . Pneumonia   . Arthritis   . Abdominal aortic aneurysm     followed by Dr Kellie Simmering- 4.8 cm per [place] 6/12- EPIC  . Complication of anesthesia     slow to wake up per pt- anesthesia record on chart  . Hypertension   . Anxiety   . Atherosclerosis of aorta     Assessment: As obtained from the medical record, patient is an 80 y/o F with PMH of COPD, obesity, atrial fibrillation on warfarin, CHF, AAA, HTN, HLD, gout, and active smoker, who presented to the ED with mid back and R flank pain x 1 day after a mechanical fall yesterday. X-ray of the thoracic spine showed likely a new compression fracture of T12 and fracture of T3 of indeterminate duration. Pharmacy consulted to assist with Warfarin dosing for a-fib.    Home Warfarin regimen:  Warfarin 2.5 mg daily except 5 mg on Wednesdays  Today, 11/6:  INR therapeutic on admission.  CBC WNL  Drug interactions:  Allopurinol,  Fenofibrate, Citalopram (all home meds)  Goal of Therapy:  INR 2-3 Monitor platelets by anticoagulation protocol: Yes   Plan:   Continue home warfarin regimen, as INR therapeutic.  Warfarin 2.5 mg PO x 1 tonight.  Monitor PO intake, drug interactions, daily PT/INR.  Monitor CBC and for signs/symptoms of bleeding.  Follow-up neurosurgery's plans.   Lindell Spar, PharmD, BCPS Pager: [phone] 05/15/2014 5:51 PM

## 2014-05-15 NOTE — H&P (Addendum)
[facility] History and Physical  Heather Stevenson UXN:[id number] DOB: 15-Feb-1930 DOA: 05/15/2014  Referring physician: Dr. Colin Rhein PCP: Alesia Richards, MD   Chief Complaint:  Mid back and right flank pain since one day   HPI:  80 year old obese female with history of COPD, active smoker, A. Fib on Coumadin, history of CHF, history of AAA of 5.5 cm (refused surgery),hypertension, hyperlipidemia, gout, who presented to the ED after a mechanical fall yesterday. Patient tripped on her clothes 2 days back and landed on her buttocks after which she was having pain in her left flank and mid back off and on. Yesterday she again fell on her knees while walking in the house and could not get up. She had associating pain in her left flank and mid back and could not get up or move.denies any head injury, loss of consciousness or sustaining any other injury. Denies any syncopal episode. She denies any recent illness or change in her medications.  Patient denies headache, dizziness, fever, chills, nausea , vomiting, chest pain, palpitations, SOB, abdominal pain, bowel or urinary symptoms. Denies change in weight or appetite.  Course in the ED  patient was hypertensive with O2 sat dropping down to 87 on room air. X-ray of the thorax 6 spine showed likely a new compression fracture of T12 and fracture of T3 of indeterminate duration. Lumbar spine x-ray was unremarkable. X-ray of the hip was negative for any fracture. X-ray of the ribs were negative for any fractures as well. CT of the head was unremarkable. Blood work done showed WBC of 10 with normal hemoglobin and platelets. Chemistry showed sodium of 134, chloride of 94, creatinine 1.23 and INR of 2.30.  Patient given 0.5 mg of IV Dilaudid after which her symptoms likely improved since she was unable to move around at all hospitalist admission requested for observation, pain control and physical therapy evaluation.    Review of Systems:   Constitutional: Denies fever, chills, appetite change and fatigue.  HEENT: Denies visual or hearing symptoms,  congestion,  trouble swallowing, neck pain, Respiratory: Denies SOB, DOE, cough, chest tightness,  and wheezing.   Cardiovascular: Denies chest pain, palpitations and leg swelling.  Gastrointestinal: Denies nausea, vomiting, abdominal pain, diarrhea, constipation, blood in stool and abdominal distention.  Genitourinary: Denies dysuria, urgency, frequency, hematuria, flank pain and difficulty urinating.  Endocrine: Denies polyuria, polydipsia. Musculoskeletal: back pain,  Difficulty ambulating, denies joint pains Skin: Denies  rash and wound.  Neurological: Denies dizziness, seizures, syncope, weakness, light-headedness, numbness and headaches.  Psychiatric/Behavioral: Denies  confusion   Past Medical History  Diagnosis Date  . Hyperlipidemia   . Gout   . History of small bowel obstruction   . COPD (chronic obstructive pulmonary disease)   . Substance abuse     Tobacco  . Depression   . Pulmonary hypertension   . Umbilical hernia   . Bronchitis   . Atrial fibrillation   . High cholesterol   . Pneumonia   . Arthritis   . Abdominal aortic aneurysm     followed by Dr Kellie Simmering- 4.8 cm per [place] 6/12- [facility]  . Complication of anesthesia     slow to wake up per pt- anesthesia record on chart  . Hypertension   . Anxiety   . Atherosclerosis of aorta    Past Surgical History  Procedure Laterality Date  . Small intestine surgery  05/2010  . Abdominal hysterectomy      Vaginal  . Hernia repair  05/2010  incarcerated Umbilical hernia  . Tonsillectomy    . Incisional hernia repair  02/21/2012    Procedure: LAPAROSCOPIC INCISIONAL HERNIA;  Surgeon: Harl Bowie, MD;  Location: [facility];  Service: General;  Laterality: N/A;   Social History:  reports that she has been smoking Cigarettes.  She has a 25 pack-year smoking history. She has never used smokeless tobacco. She  reports that she does not drink alcohol or use illicit drugs.  Allergies  Allergen Reactions  . Hctz [Hydrochlorothiazide]   . Sulfa Antibiotics   . Tetracyclines & Related     Family History  Problem Relation Age of Onset  . Cancer Mother   . Heart disease Father   . Cancer Daughter   . Deep vein thrombosis Daughter     Prior to Admission medications   Medication Sig Start Date End Date Taking? Authorizing Provider  acetaminophen (TYLENOL) 500 MG tablet Take 500 mg by mouth every 6 (six) hours as needed for moderate pain.   Yes Historical Provider, MD  allopurinol (ZYLOPRIM) 300 MG tablet Take 300 mg by mouth daily.   Yes Historical Provider, MD  Cholecalciferol ([employer] VIT D 5000 HIGH-POTENCY PO) Take 5,000 Units by mouth daily.    Yes Historical Provider, MD  citalopram (CELEXA) 20 MG tablet Take 1 tablet (20 mg total) by mouth daily. 05/01/14 05/01/15 Yes Unk Pinto, MD  dexlansoprazole (DEXILANT) 60 MG capsule Take 1 capsule (60 mg total) by mouth daily. 03/26/14  Yes Unk Pinto, MD  diltiazem (CARDIZEM CD) 240 MG 24 hr capsule Take 1 capsule (240 mg total) by mouth daily. 01/02/14  Yes Unk Pinto, MD  fenofibrate micronized (LOFIBRA) 134 MG capsule Take 134 mg by mouth daily.   Yes Historical Provider, MD  furosemide (LASIX) 20 MG tablet Take 1 tablet (20 mg total) by mouth 3 (three) times daily. 02/10/14  Yes Vicie Mutters, PA-C  Magnesium 250 MG TABS Take 250 mg by mouth daily.   Yes Historical Provider, MD  metoprolol succinate (TOPROL-XL) 100 MG 24 hr tablet Take 1 tablet (100 mg total) by mouth daily. Take with or immediately following a meal. 01/02/14  Yes Unk Pinto, MD  montelukast (SINGULAIR) 10 MG tablet Take 1 tablet (10 mg total) by mouth at bedtime. 03/26/14  Yes Unk Pinto, MD  Multiple Vitamins-Minerals (CENTRUM) tablet Take 1 tablet by mouth daily.    Yes Historical Provider, MD  potassium chloride (K-DUR) 10 MEQ tablet Take 1 tablet (10 mEq  total) by mouth 2 (two) times daily. 04/09/14  Yes Unk Pinto, MD  rosuvastatin (CRESTOR) 5 MG tablet Take 5 mg by mouth at bedtime.    Yes Historical Provider, MD  warfarin (COUMADIN) 5 MG tablet Take 5 mg by mouth daily. Takes 1/2 tab 6 days a week and 1 tab on Wed   Yes Historical Provider, MD  allopurinol (ZYLOPRIM) 300 MG tablet TAKE 1 TABLET ONCE DAILY. FOR GOUT 01/16/14   Melissa R Smith, PA-C  fenofibrate micronized (LOFIBRA) 134 MG capsule TAKE 1 CAPSULE ONCE DAILY FOR BLOOD FATS. 01/16/14   Ardis Hughs, PA-C     Physical Exam:  Filed Vitals:   05/15/14 0943 05/15/14 1000 05/15/14 1430 05/15/14 1530  BP: 143/105  133/84 132/95  Pulse: 60  91 66  Temp: 98.2 F (36.8 C)     TempSrc: Oral     Resp: _0 SpO2: 95% 93% 91% 87%    Constitutional: Vital signs reviewed.  Elderly obese female in  no acute distress HEENT: no pallor, no icterus, moist oral mucosa,  Cardiovascular: S1 and S2 is irregularly irregular, no murmurs Chest: CTAB, no wheezes, rales, or rhonchi Abdominal: Soft. Non-tender, non-distended, bowel sounds are normal, Ext: warm, no edema, tender to pressure over left flank and mid back area, SLTR negative, normal ROM of hips Neurological: A&O x3, non focal  Labs on Admission:  Basic Metabolic Panel:  Recent Labs Lab 05/15/14 1122  NA 134*  K 4.2  CL 94*  CO2 27  GLUCOSE 112*  BUN 23  CREATININE 1.23*  CALCIUM 9.4   Liver Function Tests: No results for input(s): AST, ALT, ALKPHOS, BILITOT, PROT, ALBUMIN in the last 168 hours. No results for input(s): LIPASE, AMYLASE in the last 168 hours. No results for input(s): AMMONIA in the last 168 hours. CBC:  Recent Labs Lab 05/15/14 1122  WBC 10.0  NEUTROABS 7.9*  HGB 13.2  HCT 39.4  MCV 92.7  PLT 234   Cardiac Enzymes: No results for input(s): CKTOTAL, CKMB, CKMBINDEX, TROPONINI in the last 168 hours. BNP: Invalid input(s): POCBNP CBG: No results for input(s): GLUCAP in the last 168  hours.  Radiological Exams on Admission: Dg Ribs Unilateral W/chest Left  05/15/2014   CLINICAL DATA:  80 year old female fall 1 day ago with left posterior rib pain. Initial encounter.  EXAM: LEFT RIBS AND CHEST - 3+ VIEW  COMPARISON:  12/04/2013 and earlier.  FINDINGS: Supine AP view of the chest and multiple supine left rib obliques. Larger lung volumes. Stable cardiomegaly and mediastinal contours. Thoracic scoliosis and tortuous thoracic aorta with calcified plaque. No pneumothorax or pleural effusion identified. Chronic retrocardiac hypoventilation.  Bone mineralization is within normal limits for age. Rib marker placed at the left lateral tenth or eleventh rib level. No acute displaced rib fracture identified. The T12 vertebral body appears compressed (image 3) in this seems to be new since 11/09/2013.  IMPRESSION: 1. No acute displaced rib fracture identified. However, there is evidence of a T12 compression fracture which seems to be new since 12/06/2013. If specific therapy such as vertebroplasty is desired, lumbar MRI or whole-body bone scan would best determine acuity. 2. Cardiomegaly.  No acute cardiopulmonary abnormality.   Electronically Signed   By: Lars Pinks M.D.   On: 05/15/2014 11:14   Dg Thoracic Spine 2 View  05/15/2014   CLINICAL DATA:  Status post fall 1 day ago now with left sided upper and lower back pain  EXAM: THORACIC SPINE - 2 VIEW  COMPARISON:  Right rib series of today's date.  FINDINGS: There is reverse S-shaped thoracic scoliosis. There is mild loss of height of the body at T3 and of T12 there are no abnormal paravertebral soft tissue densities. On the lateral film airspace opacities obscure the mid upper thoracic spine. A metallic BB projects anterior to T12 on the lateral film.  IMPRESSION: There is mild loss of height of the bodies of T3 and T12 the T12 compression is new since a CT scan of December 31, 2012. The T3 compression is of uncertain age.  Further workup with MRI or  nuclear bone scanning as previously recommended is available upon request.   Electronically Signed   By: David  Martinique   On: 05/15/2014 13:26   Dg Lumbar Spine Complete  05/15/2014   CLINICAL DATA:  Status post fall 1 day ago now with upper and lower back pain, left-sided  EXAM: LUMBAR SPINE - COMPLETE 4+ VIEW  COMPARISON:  Sagittal reconstructed images of the  lumbar spine from an abdominal pelvic CT scan of December 31, 2012.  FINDINGS: The lumbar vertebral bodies are preserved in height. There is partial compression of the body of T12. The lumbar intervertebral disc space heights are reasonably well maintained. There is calcification of the L5-S1 disc. There is no spondylolisthesis. There is facet joint hypertrophy at L4-5 and at L5-S1. The observed portions of the sacrum are normal.  IMPRESSION: There is no acute bony abnormality of the lumbar spine.   Electronically Signed   By: David  Martin[place]   On: 05/15/2014 13:28   Dg Hip Complete Left  05/15/2014   CLINICAL DATA:  Fall 1 day ago.  EXAM: LEFT HIP - COMPLETE 2+ VIEW  COMPARISON:  None.  FINDINGS: There is no evidence of hip fracture or dislocation. There is no evidence of arthropathy or other focal bone abnormality.  IMPRESSION: Negative.   Electronically Signed   By: Franchot Gallo M.D.   On: 05/15/2014 11:13   Ct Head Wo Contrast  05/15/2014   CLINICAL DATA:  Left flank pain after a fall yesterday. Patient tripped and fell. Denies loss of consciousness. Second fall this week.  EXAM: CT HEAD WITHOUT CONTRAST  TECHNIQUE: Contiguous axial images were obtained from the base of the skull through the vertex without intravenous contrast.  COMPARISON:  None.  FINDINGS: There is moderate central and cortical atrophy. Periventricular white matter changes are consistent with small vessel disease. There is no evidence for hemorrhage, mass lesion, or acute infarction. Bone windows are unremarkable.  IMPRESSION: 1. Atrophy and small vessel disease. 2.  No evidence for  acute intracranial abnormality.   Electronically Signed   By: Shon Hale M.D.   On: 05/15/2014 10:49    EKG: A. Fib at 101, no ST-T changes  Assessment/Plan  Principal Problem:   T12 compression fracture Admit to MedSurg under observation. Pain control with scheduled ultracet q 6 hrs  and prn morphine 2 mg  q 3 hrs. Add bowel regimen Physical therapy evaluation Physician has contacted neurosurgery for further recommendations. May possibly need thoraco lumbarcorset -Will obtain MRI of the thoracic spine for further evaluation of the compression fracture. -Check vitamin D level.  Active Problems:   Hypertension Continue Cardizem , metoprolol and Lasix  Atrial fibrillation Rate controlled. Continue metoprolol, Cardizem. INR therapeutic. Coumadin performs see.   COPD (chronic obstructive pulmonary disease) Will place on O2 via nasal,. Continue Singulair. Will add albuterol inhaler. Counseled on smoking cessation.  Refused nicotine patch.   type 2 diabetes mellitus w/Stage 4 CKD  Last A1c of 7.2. Not on any medications. Will place on sliding scale insulin and monitor. He function appears to be stable    Obesity (BMI 38.76)    Abdominal aortic aneurysm Patient has an infrarenal abdominal aortic aneurysm of 5.4 cm as per CT scan in 2014. As per PCP notes she has refused for surgery. Follows with Dr. Kellie Simmering    Diabetes mellitus Not on any medications. A1c of 7.2. Will place on sliding scale insulin and monitor  Dyslipidemia Continue statin  GERD Continue PPI   History of gout Continue allopurinol   Tobacco abuse Counseled on cessation Refused nicotine patch  Diet:cardiac/ diabetic  DVT prophylaxis: sq lovenox   Code Status: full code Family Communication: discussed with granddaughter Katie at bedside Disposition Plan:  admit to [place].possibly home once pain improved  Edison Nicholson, [place] [facility] Pager [phone]  Total time spent on admission :55  minutes  If 7PM-7AM, please contact night-coverage [email or web address]  Password [email or web address] 05/15/2014, 4:24 PM

## 2014-05-15 NOTE — ED Notes (Addendum)
Pt denies GU and respiratory complaints.  No bruising noted during assessment.

## 2014-05-15 NOTE — ED Notes (Signed)
Pt aware of need of urine sample. States she will let staff know when she able to use restroom

## 2014-05-15 NOTE — ED Notes (Addendum)
Per PTAR, Pt, from [employer], c/o L flank pain after a fall yesterday.  Denies pain, except w/ movement.  Pt reports she tripped and fell.  Denies hitting head and LOC.  Pt sts this is her second fall this week w/ the other also being a trip and fall.      PTAR noted that Pt's son is a higher up w/ [facility].

## 2014-05-15 NOTE — Plan of Care (Signed)
Problem: Phase I Progression Outcomes Goal: Voiding-avoid urinary catheter unless indicated Outcome: Completed/Met Date Met:  05/15/14     

## 2014-05-15 NOTE — ED Notes (Signed)
This RN attempted to sit Pt up in bed/ambulate Pt.  I was able to passively sit the Pt up higher in the bed w/ assistance of the bed.  Pt could not sit up on her own.  Pt sts that she would be unable to ambulate.  MD notified.

## 2014-05-15 NOTE — Progress Notes (Signed)
05/15/14 Nursing 10pm   Patient back from MRI at this time. MRI tech reports patient refused to finish MRI exam.

## 2014-05-15 NOTE — ED Notes (Signed)
Patient is out the room in x-ray.  I will perform EKG when patient return

## 2014-05-15 NOTE — ED Notes (Signed)
Pt given ice chips to facilitate pt to urinate

## 2014-05-15 NOTE — Plan of Care (Signed)
Problem: Phase I Progression Outcomes Goal: Hemodynamically stable Outcome: Completed/Met Date Met:  05/15/14     

## 2014-05-15 NOTE — ED Notes (Signed)
Pt sitting in bed up more in bed, family member in room. States pain has decreased after having dilaudid. No needs at this time, awaiting disposition

## 2014-05-15 NOTE — ED Notes (Addendum)
Patient transported to CT/Xray.

## 2014-05-15 NOTE — Plan of Care (Signed)
Problem: Phase I Progression Outcomes Goal: Pain controlled with appropriate interventions Outcome: Completed/Met Date Met:  05/15/14

## 2014-05-16 DIAGNOSIS — S22000D Wedge compression fracture of unspecified thoracic vertebra, subsequent encounter for fracture with routine healing: Secondary | ICD-10-CM

## 2014-05-16 DIAGNOSIS — N184 Chronic kidney disease, stage 4 (severe): Secondary | ICD-10-CM

## 2014-05-16 LAB — BASIC METABOLIC PANEL
Anion gap: 13 (ref 5–15)
BUN: 22 mg/dL (ref 6–23)
CALCIUM: 9.5 mg/dL (ref 8.4–10.5)
CO2: 27 meq/L (ref 19–32)
Chloride: 96 mEq/L (ref 96–112)
Creatinine, Ser: 1.25 mg/dL — ABNORMAL HIGH (ref 0.50–1.10)
GFR calc Af Amer: 45 mL/min — ABNORMAL LOW (ref >90)
GFR calc non Af Amer: 39 mL/min — ABNORMAL LOW (ref >90)
GLUCOSE: 121 mg/dL — AB (ref 70–99)
Potassium: 3.6 mEq/L — ABNORMAL LOW (ref 3.7–5.3)
SODIUM: 136 meq/L — AB (ref 137–147)

## 2014-05-16 LAB — PROTIME-INR
INR: 2.44 — ABNORMAL HIGH (ref 0.00–1.49)
Prothrombin Time: 26.7 seconds — ABNORMAL HIGH (ref 11.6–15.2)

## 2014-05-16 LAB — GLUCOSE, CAPILLARY
GLUCOSE-CAPILLARY: 135 mg/dL — AB (ref 70–99)
Glucose-Capillary: 129 mg/dL — ABNORMAL HIGH (ref 70–99)
Glucose-Capillary: 124 mg/dL — ABNORMAL HIGH (ref 70–99)
Glucose-Capillary: 154 mg/dL — ABNORMAL HIGH (ref 70–99)

## 2014-05-16 MED ORDER — ALUM & MAG HYDROXIDE-SIMETH 200-200-20 MG/5ML PO SUSP
15.0000 mL | ORAL | Status: DC | PRN
Start: 2014-05-16 — End: 2014-05-20

## 2014-05-16 MED ORDER — DSS 100 MG PO CAPS: 100.0000 mg | ORAL_CAPSULE | Freq: Two times a day (BID) | ORAL | Status: DC

## 2014-05-16 MED ORDER — WARFARIN SODIUM 2.5 MG PO TABS
2.5000 mg | ORAL_TABLET | Freq: Once | ORAL | Status: AC
  Administered 2014-05-16: 2.5 mg via ORAL
  Filled 2014-05-16: qty 1

## 2014-05-16 MED ORDER — HYDROCODONE-ACETAMINOPHEN 5-325 MG PO TABS: 1.0000 | ORAL_TABLET | ORAL | Status: DC | PRN

## 2014-05-16 MED ORDER — LIP MEDEX EX OINT
TOPICAL_OINTMENT | CUTANEOUS | Status: AC
  Administered 2014-05-16: 11:00:00
  Filled 2014-05-16: qty 7

## 2014-05-16 MED ORDER — HYDROCODONE-ACETAMINOPHEN 5-325 MG PO TABS
1.0000 | ORAL_TABLET | ORAL | Status: DC | PRN
  Administered 2014-05-16: 2 via ORAL
  Administered 2014-05-17 (×2): 1 via ORAL
  Filled 2014-05-16: qty 1
  Filled 2014-05-16: qty 2
  Filled 2014-05-16: qty 1

## 2014-05-16 NOTE — Progress Notes (Signed)
Notified [facility] of [place] PT/OT and aide needed for dc home today. Pt states she has RW at home. Requested [facility] for [facility]. Provided pt and grand-daughter with list of private duty agencies that service [facility]. Jonnie Finner RN CCM Case Mgmt phone [phone]

## 2014-05-16 NOTE — Progress Notes (Signed)
CARE MANAGEMENT NOTE 05/16/2014  Patient:  Heather Stevenson, Heather Stevenson   Account Number:  [id number]  Date Initiated:  05/16/2014  Documentation initiated by:  [facility]  Subjective/Objective Assessment:   T12 compression fracture     Action/Plan:   lives alone   Anticipated DC Date:  05/16/2014   Anticipated DC Plan:  [place]  CM consult      [place] Choice  HOME HEALTH   Choice offered to / List presented to:  [name]        [place] arranged  HH-10 DISEASE MANAGEMENT  HH-3 OT  HH-4 NURSE'S AIDE  HH-1 RN      [place].   Status of service:  Completed, signed off Medicare Important Message given?   (If response is "NO", the following Medicare IM given date fields will be blank) Date Medicare IM given:   Medicare IM given by:   Date Additional Medicare IM given:   Additional Medicare IM given by:    Discharge Disposition:  [place]  Per UR Regulation:    If discussed at Long Length of Stay Meetings, dates discussed:    Comments:  05/16/2014  4:32 PM  Notified [facility] of [place] PT/OT and aide needed for dc home today. Pt states she has RW at home. Requested [facility] for [facility]. Provided pt and grand-daughter with list of private duty agencies that service [facility]. Jonnie Finner RN CCM Case Mgmt phone [phone]

## 2014-05-16 NOTE — Plan of Care (Signed)
Problem: Phase II Progression Outcomes Goal: IV changed to normal saline lock Outcome: Completed/Met Date Met:  05/16/14 Goal: Obtain order to discontinue catheter if appropriate Outcome: Not Applicable Date Met:  16/24/46

## 2014-05-16 NOTE — Progress Notes (Signed)
CSW received referral for potential new SNF.  CSW reviewed chart and noted that PT recommends No PT follow up and pt discharging home today.   Inappropriate CSW referral.   CSW signing off.   Alison Murray, MSW, LCSW Clinical Social Work Coverage [phone]

## 2014-05-16 NOTE — Progress Notes (Signed)
Utilization Review completed.  

## 2014-05-16 NOTE — Evaluation (Signed)
Physical Therapy Evaluation Patient Details Name: Heather Stevenson MRN: [id number] DOB: [birth year] Today's Date: 05/16/2014   History of Present Illness  80 year old obese female with history of COPD, active smoker, A. Fib on Coumadin, history of CHF, history of AAA of 5.5 cm (refused surgery),hypertension, hyperlipidemia, gout, admitted 05/15/14 after a mechanical fall with onset of back pain.   Thoracic xray shows T12 compression fx and pt refused to finish MRI per progress notes.  Clinical Impression  Pt currently with functional limitations due to the deficits listed below (see PT Problem List).  Pt will benefit from skilled PT to increase their independence and safety with mobility to allow discharge to the venue listed below.  Pt required mod assist for bed mobility however fearful of pain.  Pt denied pain during session however premedicated with morphine and reports feeling "out of it."  Nsg tech reports pt requesting back to bed shortly after due to pain with sitting however.  Recommend supervision for mobility with current pain meds due to increased fall risk.     Follow Up Recommendations Supervision for mobility/OOB;No PT follow up    Equipment Recommendations  None recommended by PT    Recommendations for Other Services       Precautions / Restrictions Precautions Precautions: Back Precaution Comments: pt educated on back precautions for pain control Restrictions Weight Bearing Restrictions: No      Mobility  Bed Mobility Overal bed mobility: Needs Assistance Bed Mobility: Supine to Sit     Supine to sit: Mod assist     General bed mobility comments: verbal and tactile cues for log roll technique for pain control, assist for trunk upright  Transfers Overall transfer level: Needs assistance Equipment used: Rolling walker (2 wheeled) Transfers: Sit to/from Stand Sit to Stand: Min assist         General transfer comment: verbal cues for safe  technique  Ambulation/Gait Ambulation/Gait assistance: Min assist Ambulation Distance (Feet): 100 Feet Assistive device: Rolling walker (2 wheeled) Gait Pattern/deviations: Step-through pattern Gait velocity: decr   General Gait Details: assist for RW however pt used to rollator, verbal cues for back precautions  Stairs            Wheelchair Mobility    Modified Rankin (Stroke Patients Only)       Balance                                             Pertinent Vitals/Pain Pain Assessment: No/denies pain Pain Intervention(s): Monitored during session;Premedicated before session;Repositioned    Home Living Family/patient expects to be discharged to:: Private residence Living Arrangements: Alone   Type of Home: Apartment Home Access: Elevator     Home Layout: One level Home Equipment: Environmental consultant - 4 wheels;Shower seat - built in      Prior Function Level of Independence: Independent with assistive device(s)         Comments: rollator     Hand Dominance        Extremity/Trunk Assessment               Lower Extremity Assessment: Overall WFL for tasks assessed         Communication   Communication: No difficulties  Cognition Arousal/Alertness: Awake/alert Behavior During Therapy: WFL for tasks assessed/performed Overall Cognitive Status: Impaired/Different from baseline Area of Impairment: Following commands  Following Commands: Follows one step commands with increased time       General Comments: above likely due to pain meds    General Comments      Exercises        Assessment/Plan    PT Assessment Patient needs continued PT services  PT Diagnosis Difficulty walking;Acute pain   PT Problem List Decreased activity tolerance;Pain;Decreased knowledge of precautions;Decreased mobility;Decreased knowledge of use of DME  PT Treatment Interventions DME instruction;Gait training;Functional mobility  training;Therapeutic activities;Therapeutic exercise;Patient/family education   PT Goals (Current goals can be found in the Care Plan section) Acute Rehab PT Goals PT Goal Formulation: With patient Time For Goal Achievement: 05/23/14 Potential to Achieve Goals: Good    Frequency Min 3X/week   Barriers to discharge        Co-evaluation               End of Session   Activity Tolerance: Patient tolerated treatment well Patient left: in chair;with call bell/phone within reach Nurse Communication: Mobility status    Functional Assessment Tool Used: clinical judgement Functional Limitation: Mobility: Walking and moving around Mobility: Walking and Moving Around Current Status ([id number]): At least 40 percent but less than 60 percent impaired, limited or restricted Mobility: Walking and Moving Around Goal Status [phone]): At least 1 percent but less than 20 percent impaired, limited or restricted    Time: 0905-0921 PT Time Calculation (min): 16 min   Charges:   PT Evaluation $Initial PT Evaluation Tier I: 1 Procedure PT Treatments $Gait Training: 8-22 mins   PT G Codes:   Functional Assessment Tool Used: clinical judgement Functional Limitation: Mobility: Walking and moving around    [place] E 05/16/2014, 10:16 AM Carmelia Bake, PT, DPT 05/16/2014 Pager: [phone]

## 2014-05-16 NOTE — Discharge Summary (Signed)
Physician Discharge Summary  Heather Stevenson OVF:[id number] DOB: 02/11/1930 DOA: 05/15/2014  PCP: Alesia Richards, MD  Admit date: 05/15/2014 Discharge date: 05/16/2014  Recommendations for Outpatient Follow-up:  1. HH PT/OT/RN/aid 2. PCP follow up in 1 week or sooner if needed.  Referral for kyphoplasty if pain not improving or adequately controlled.   Discharge Diagnoses:  Principal Problem:   T12 compression fracture Active Problems:   Hypertension   COPD (chronic obstructive pulmonary disease)   T2_NIDDM w/Stage 4 CKD (GFR 28 ml/min)   Obesity (BMI 38.76)   CKD (chronic kidney disease)   Back pain   Controlled atrial fibrillation   Tobacco abuse   CKD (chronic kidney disease), stage IV   AAA (abdominal aortic aneurysm)   Thoracic compression fracture   Discharge Condition: stable, improved  Diet recommendation: healthy heart/carb modified  Wt Readings from Last 3 Encounters:  05/15/14 98.431 kg (217 lb)  05/15/14 98.431 kg (217 lb)  05/01/14 98.068 kg (216 lb 3.2 oz)    History of present illness:  80 year old obese female with history of COPD, active smoker, A. Fib on Coumadin, history of CHF, history of AAA of 5.5 cm (refused surgery),hypertension, hyperlipidemia, gout, who presented to the ED after a mechanical fall the day prior to admission.  Two days prior to admission, she had a mechanical fall by tripping on her clothes, landing on her buttocks after which she was having pain in her left flank and mid back.  The day prior to admission, she had another mechanical fall onto her knees with difficulty getting up because of back and left flank pain.  She denied LOC, syncope, or recent illness.    Hospital Course:    T12 compression fracture with mild loss of height of T3 and T12 with new compression fracture of T12 since June of 2014.  She had no compromise of bowels or bladder and no lower extremity symptoms.  She was admitted under observation for pain control.   Occupational therapy provided a thoracolumbar corset and physical therapy evaluated her and she was able to walk with some assistance to the nursing station.  She has been set up for home health PT/OT/RN and aid.  The family is unable to stay with her for the next few days and are planning to hire an aid to stay with her at her independent living facility.  Vitamin D level 68 a few months ago.    Hypertension/HLD, blood pressure stable.  Continued Cardizem, statin, metoprolol and Lasix  Atrial fibrillation, rate controlled and INR therapeutic.  Continued metoprolol, Cardizem, and Coumadin  COPD (chronic obstructive pulmonary disease), had some initial acute hypoxic respiratory failure due to splinting and pain medication which quickly resolved.  Continued Singulair, albuterol.  Counseled smoking cessation. Refused nicotine patch.  Type 2 diabetes mellitus w/ renal manifestations Last A1c of 7.2. Not on any medications. Will place on sliding scale insulin and monitor.    Stage 4 CKD, remained stable.   Abdominal aortic aneurysm, infrarenal abdominal aortic aneurysm of 5.4 cm as per CT scan in 2014. As per PCP notes she has refused for surgery. Follows with Dr. Kellie Simmering  GERD and gout, stable, continued PPI and allopurinol  Tobacco abuse Counseled on cessation Refused nicotine patch   Procedures:  CT head:  Atrophy with small vessel disease, no acute abnl  XR ribs:  No rib fracture or acute cardiopulmonary abnl  Left hip XR:  No fracture  Lumbar spine XR:  No bony abnl of lumbar spine  Thoracic spine XR:  Mild loss of height of T3 and T12. T3 change of uncertain age and O13 fracture new since 2014  Consultations:  Neurosurgery by phone  Discharge Exam: Filed Vitals:   05/16/14 0540  BP: 116/74  Pulse: 80  Temp: 98.6 F (37 C)  Resp: 20   Filed Vitals:   05/15/14 1630 05/15/14 1719 05/15/14 2155 05/16/14 0540  BP: 155/93 147/80 142/97 116/74  Pulse:  90 71 80  Temp:   98.1 F (36.7 C) 98.4 F (36.9 C) 98.6 F (37 C)  TempSrc:  Axillary Oral Oral  Resp: _0 Height:  5' 5.25" (1.657 m)    Weight:  98.431 kg (217 lb)    SpO2: 91% 91% 93% 97%    General: WF, NAD HEENT:  NCAT, MMM Cardiovascular: RRR, no mrg, 2+ pulses Respiratory: CTAB at bases ABD:  NABS, soft, ND, mild TTP in the LLQ and left flank area without rebound or guarding, small area of overlying bruising on left flank MSK:  No LEE, normal tone and bulk NEURO:  5/5 bilateral lower extremity strength, sensation intact, brisk bilateral reflexes  Discharge Instructions      Discharge Instructions    (HEART FAILURE PATIENTS) Call MD:  Anytime you have any of the following symptoms: 1) 3 pound weight gain in 24 hours or 5 pounds in 1 week 2) shortness of breath, with or without a dry hacking cough 3) swelling in the hands, feet or stomach 4) if you have to sleep on extra pillows at night in order to breathe.    Complete by:  As directed      Call MD for:  difficulty breathing, headache or visual disturbances    Complete by:  As directed      Call MD for:  extreme fatigue    Complete by:  As directed      Call MD for:  hives    Complete by:  As directed      Call MD for:  persistant dizziness or light-headedness    Complete by:  As directed      Call MD for:  persistant nausea and vomiting    Complete by:  As directed      Call MD for:  severe uncontrolled pain    Complete by:  As directed      Call MD for:  temperature >100.4    Complete by:  As directed      Diet - low sodium heart healthy    Complete by:  As directed      Diet Carb Modified    Complete by:  As directed      Discharge instructions    Complete by:  As directed   You were hospitalized with a fall and a compression fracture of your spine.  Please call 911 immediately if you develop numbness or tingling in your legs.  Use your brace during the day as needed for comfort.  You may take it off if you want during  the day or at night for sleep.  Use the pain medication as sparingly as possible as it can cause sleepiness, increased risk of falls, constipation, and urinary retention.  Do NOT use naproxen, ibuprofen, goody's powders/aspirin for pain as these medications can cause kidney problems.  Tylenol is okay to use.  Follow up with your primary care doctor in about a week.  If your pain is not getting better, he may refer you for a  procedure to help your back pain.     Driving Restrictions    Complete by:  As directed   No driving or operating heavy machinery while taking vicodin     Increase activity slowly    Complete by:  As directed             Medication List    TAKE these medications        acetaminophen 500 MG tablet  Commonly known as:  TYLENOL  Take 500 mg by mouth every 6 (six) hours as needed for moderate pain.     allopurinol 300 MG tablet  Commonly known as:  ZYLOPRIM  TAKE 1 TABLET ONCE DAILY. FOR GOUT     CENTRUM tablet  Take 1 tablet by mouth daily.     citalopram 20 MG tablet  Commonly known as:  CELEXA  Take 1 tablet (20 mg total) by mouth daily.     [employer] VIT D 5000 HIGH-POTENCY PO  Take 5,000 Units by mouth daily.     dexlansoprazole 60 MG capsule  Commonly known as:  DEXILANT  Take 1 capsule (60 mg total) by mouth daily.     diltiazem 240 MG 24 hr capsule  Commonly known as:  CARDIZEM CD  Take 1 capsule (240 mg total) by mouth daily.     DSS 100 MG Caps  Take 100 mg by mouth 2 (two) times daily.     fenofibrate micronized 134 MG capsule  Commonly known as:  LOFIBRA  TAKE 1 CAPSULE ONCE DAILY FOR BLOOD FATS.     furosemide 20 MG tablet  Commonly known as:  LASIX  Take 1 tablet (20 mg total) by mouth 3 (three) times daily.     HYDROcodone-acetaminophen 5-325 MG per tablet  Commonly known as:  NORCO/VICODIN  Take 1-2 tablets by mouth every 4 (four) hours as needed for moderate pain or severe pain.     Magnesium 250 MG Tabs  Take 250 mg by mouth daily.      metoprolol succinate 100 MG 24 hr tablet  Commonly known as:  TOPROL-XL  Take 1 tablet (100 mg total) by mouth daily. Take with or immediately following a meal.     montelukast 10 MG tablet  Commonly known as:  SINGULAIR  Take 1 tablet (10 mg total) by mouth at bedtime.     potassium chloride 10 MEQ tablet  Commonly known as:  K-DUR  Take 1 tablet (10 mEq total) by mouth 2 (two) times daily.     rosuvastatin 5 MG tablet  Commonly known as:  CRESTOR  Take 5 mg by mouth at bedtime.     warfarin 5 MG tablet  Commonly known as:  COUMADIN  Take 5 mg by mouth daily. Takes 1/2 tab 6 days a week and 1 tab on Wed       Follow-up Information    Follow up with Alesia Richards, MD. Schedule an appointment as soon as possible for a visit in 1 week.   Specialty:  Internal Medicine   Contact information:   [address] [place] [address] [phone]        The results of significant diagnostics from this hospitalization (including imaging, microbiology, ancillary and laboratory) are listed below for reference.    Significant Diagnostic Studies: Dg Ribs Unilateral W/chest Left  05/15/2014   CLINICAL DATA:  80 year old female fall 1 day ago with left posterior rib pain. Initial encounter.  EXAM: LEFT RIBS AND CHEST -  3+ VIEW  COMPARISON:  12/04/2013 and earlier.  FINDINGS: Supine AP view of the chest and multiple supine left rib obliques. Larger lung volumes. Stable cardiomegaly and mediastinal contours. Thoracic scoliosis and tortuous thoracic aorta with calcified plaque. No pneumothorax or pleural effusion identified. Chronic retrocardiac hypoventilation.  Bone mineralization is within normal limits for age. Rib marker placed at the left lateral tenth or eleventh rib level. No acute displaced rib fracture identified. The T12 vertebral body appears compressed (image 3) in this seems to be new since 11/09/2013.  IMPRESSION: 1. No acute displaced rib fracture  identified. However, there is evidence of a T12 compression fracture which seems to be new since 12/06/2013. If specific therapy such as vertebroplasty is desired, lumbar MRI or whole-body bone scan would best determine acuity. 2. Cardiomegaly.  No acute cardiopulmonary abnormality.   Electronically Signed   By: Lars Pinks M.D.   On: 05/15/2014 11:14   Dg Thoracic Spine 2 View  05/15/2014   CLINICAL DATA:  Status post fall 1 day ago now with left sided upper and lower back pain  EXAM: THORACIC SPINE - 2 VIEW  COMPARISON:  Right rib series of today's date.  FINDINGS: There is reverse S-shaped thoracic scoliosis. There is mild loss of height of the body at T3 and of T12 there are no abnormal paravertebral soft tissue densities. On the lateral film airspace opacities obscure the mid upper thoracic spine. A metallic BB projects anterior to T12 on the lateral film.  IMPRESSION: There is mild loss of height of the bodies of T3 and T12 the T12 compression is new since a CT scan of December 31, 2012. The T3 compression is of uncertain age.  Further workup with MRI or nuclear bone scanning as previously recommended is available upon request.   Electronically Signed   By: David  Martinique   On: 05/15/2014 13:26   Dg Lumbar Spine Complete  05/15/2014   CLINICAL DATA:  Status post fall 1 day ago now with upper and lower back pain, left-sided  EXAM: LUMBAR SPINE - COMPLETE 4+ VIEW  COMPARISON:  Sagittal reconstructed images of the lumbar spine from an abdominal pelvic CT scan of December 31, 2012.  FINDINGS: The lumbar vertebral bodies are preserved in height. There is partial compression of the body of T12. The lumbar intervertebral disc space heights are reasonably well maintained. There is calcification of the L5-S1 disc. There is no spondylolisthesis. There is facet joint hypertrophy at L4-5 and at L5-S1. The observed portions of the sacrum are normal.  IMPRESSION: There is no acute bony abnormality of the lumbar spine.    Electronically Signed   By: David  Martinique   On: 05/15/2014 13:28   Dg Hip Complete Left  05/15/2014   CLINICAL DATA:  Fall 1 day ago.  EXAM: LEFT HIP - COMPLETE 2+ VIEW  COMPARISON:  None.  FINDINGS: There is no evidence of hip fracture or dislocation. There is no evidence of arthropathy or other focal bone abnormality.  IMPRESSION: Negative.   Electronically Signed   By: Franchot Gallo M.D.   On: 05/15/2014 11:13   Ct Head Wo Contrast  05/15/2014   CLINICAL DATA:  Left flank pain after a fall yesterday. Patient tripped and fell. Denies loss of consciousness. Second fall this week.  EXAM: CT HEAD WITHOUT CONTRAST  TECHNIQUE: Contiguous axial images were obtained from the base of the skull through the vertex without intravenous contrast.  COMPARISON:  None.  FINDINGS: There is moderate central  and cortical atrophy. Periventricular white matter changes are consistent with small vessel disease. There is no evidence for hemorrhage, mass lesion, or acute infarction. Bone windows are unremarkable.  IMPRESSION: 1. Atrophy and small vessel disease. 2.  No evidence for acute intracranial abnormality.   Electronically Signed   By: Shon Hale M.D.   On: 05/15/2014 10:49    Microbiology: No results found for this or any previous visit (from the past 240 hour(s)).   Labs: Basic Metabolic Panel:  Recent Labs Lab 05/15/14 1122 05/16/14 0514  NA 134* 136*  K 4.2 3.6*  CL 94* 96  CO2 27 27  GLUCOSE 112* 121*  BUN 23 22  CREATININE 1.23* 1.25*  CALCIUM 9.4 9.5   Liver Function Tests: No results for input(s): AST, ALT, ALKPHOS, BILITOT, PROT, ALBUMIN in the last 168 hours. No results for input(s): LIPASE, AMYLASE in the last 168 hours. No results for input(s): AMMONIA in the last 168 hours. CBC:  Recent Labs Lab 05/15/14 1122  WBC 10.0  NEUTROABS 7.9*  HGB 13.2  HCT 39.4  MCV 92.7  PLT 234   Cardiac Enzymes: No results for input(s): CKTOTAL, CKMB, CKMBINDEX, TROPONINI in the last 168  hours. BNP: BNP (last 3 results)  Recent Labs  12/04/13 1428  PROBNP 10429.0*   CBG:  Recent Labs Lab 05/15/14 1735 05/15/14 2208 05/16/14 0740 05/16/14 1220  GLUCAP 125* 128* 135* 129*    Time coordinating discharge: 45 minutes  Signed:  Yenni Carra  [facility] 05/16/2014, 1:08 PM

## 2014-05-16 NOTE — Progress Notes (Signed)
[place] for Warfarin Indication: atrial fibrillation  Allergies  Allergen Reactions  . Hctz [Hydrochlorothiazide]   . Sulfa Antibiotics   . Tetracyclines & Related     Patient Measurements: Height: 5' 5.25" (165.7 cm) Weight: 217 lb (98.431 kg) IBW/kg (Calculated) : 57.58  Vital Signs: Temp: 98.6 F (37 C) (11/07 0540) Temp Source: Oral (11/07 0540) BP: 116/74 mmHg (11/07 0540) Pulse Rate: 80 (11/07 0540)  Labs:  Recent Labs  05/15/14 1122 05/16/14 0514  HGB 13.2  --   HCT 39.4  --   PLT 234  --   LABPROT 25.5* 26.7*  INR 2.30* 2.44*  CREATININE 1.23* 1.25*    Estimated Creatinine Clearance: 40.5 mL/min (by C-G formula based on Cr of 1.25).   Assessment: As obtained from the medical record, patient is an 80 y/o F with PMH of COPD, obesity, atrial fibrillation on warfarin, CHF, AAA, HTN, HLD, gout, and active smoker, who presented to the ED with mid back and R flank pain x 1 day after a mechanical fall yesterday. X-ray of the thoracic spine showed likely a new compression fracture of T12 and fracture of T3 of indeterminate duration. Pharmacy consulted to assist with Warfarin dosing for a-fib.    Home Warfarin regimen:  Warfarin 2.5 mg daily except 5 mg on Wednesdays  Today, 11/7:  INR = 2.44 (therapeutic).  CBC WNL 11/6  Drug interactions:  Allopurinol, Fenofibrate, Citalopram (all home meds)  Goal of Therapy:  INR 2-3 Monitor platelets by anticoagulation protocol: Yes   Plan:   Continue home warfarin regimen, as INR therapeutic, Warfarin 2.5 mg PO x 1 tonight.  Monitor PO intake, drug interactions, daily PT/INR.  Monitor CBC and for signs/symptoms of bleeding.  Follow-up neurosurgery's plans or if possible procedure (ex vertebroplasty,etc.) needed that will require warfarin being held   Doreene Eland, PharmD, BCPS.   Pager: [phone] 05/16/2014 9:52 AM

## 2014-05-16 NOTE — Plan of Care (Signed)
Problem: Phase I Progression Outcomes Goal: OOB as tolerated unless otherwise ordered Outcome: Completed/Met Date Met:  05/16/14 Goal: Initial discharge plan identified Outcome: Completed/Met Date Met:  05/16/14 Goal: Other Phase I Outcomes/Goals Outcome: Completed/Met Date Met:  05/16/14  Problem: Phase II Progression Outcomes Goal: Progress activity as tolerated unless otherwise ordered Outcome: Completed/Met Date Met:  05/16/14 Goal: Discharge plan established Outcome: Completed/Met Date Met:  05/16/14 Goal: Vital signs remain stable Outcome: Completed/Met Date Met:  05/16/14 Goal: Other Phase II Outcomes/Goals Outcome: Not Applicable Date Met:  16/10/96  Problem: Phase III Progression Outcomes Goal: Voiding independently Outcome: Completed/Met Date Met:  05/16/14 Goal: Foley discontinued Outcome: Not Applicable Date Met:  04/54/09

## 2014-05-16 NOTE — Plan of Care (Signed)
Problem: Discharge Progression Outcomes Goal: Hemodynamically stable Outcome: Completed/Met Date Met:  05/16/14 Goal: Tolerating diet Outcome: Completed/Met Date Met:  05/16/14

## 2014-05-17 ENCOUNTER — Encounter (HOSPITAL_COMMUNITY): Payer: Self-pay | Admitting: Internal Medicine

## 2014-05-17 DIAGNOSIS — I951 Orthostatic hypotension: Secondary | ICD-10-CM

## 2014-05-17 DIAGNOSIS — J449 Chronic obstructive pulmonary disease, unspecified: Secondary | ICD-10-CM

## 2014-05-17 LAB — CBC WITH DIFFERENTIAL/PLATELET
BASOS ABS: 0 10*3/uL (ref 0.0–0.1)
Basophils Relative: 0 % (ref 0–1)
Eosinophils Absolute: 0.1 10*3/uL (ref 0.0–0.7)
Eosinophils Relative: 1 % (ref 0–5)
HEMATOCRIT: 38.5 % (ref 36.0–46.0)
Hemoglobin: 12.7 g/dL (ref 12.0–15.0)
LYMPHS PCT: 20 % (ref 12–46)
Lymphs Abs: 1.5 10*3/uL (ref 0.7–4.0)
MCH: 30.9 pg (ref 26.0–34.0)
MCHC: 33 g/dL (ref 30.0–36.0)
MCV: 93.7 fL (ref 78.0–100.0)
Monocytes Absolute: 0.6 10*3/uL (ref 0.1–1.0)
Monocytes Relative: 8 % (ref 3–12)
NEUTROS ABS: 5.2 10*3/uL (ref 1.7–7.7)
Neutrophils Relative %: 71 % (ref 43–77)
PLATELETS: 231 10*3/uL (ref 150–400)
RBC: 4.11 MIL/uL (ref 3.87–5.11)
RDW: 14.9 % (ref 11.5–15.5)
WBC: 7.3 10*3/uL (ref 4.0–10.5)

## 2014-05-17 LAB — GLUCOSE, CAPILLARY
GLUCOSE-CAPILLARY: 127 mg/dL — AB (ref 70–99)
Glucose-Capillary: 121 mg/dL — ABNORMAL HIGH (ref 70–99)
Glucose-Capillary: 141 mg/dL — ABNORMAL HIGH (ref 70–99)

## 2014-05-17 LAB — COMPREHENSIVE METABOLIC PANEL
ALBUMIN: 2.9 g/dL — AB (ref 3.5–5.2)
ALT: 21 U/L (ref 0–35)
ANION GAP: 11 (ref 5–15)
AST: 31 U/L (ref 0–37)
Alkaline Phosphatase: 68 U/L (ref 39–117)
BILIRUBIN TOTAL: 0.6 mg/dL (ref 0.3–1.2)
BUN: 26 mg/dL — AB (ref 6–23)
CHLORIDE: 96 meq/L (ref 96–112)
CO2: 29 mEq/L (ref 19–32)
CREATININE: 1.45 mg/dL — AB (ref 0.50–1.10)
Calcium: 9.1 mg/dL (ref 8.4–10.5)
GFR calc Af Amer: 38 mL/min — ABNORMAL LOW (ref >90)
GFR, EST NON AFRICAN AMERICAN: 33 mL/min — AB (ref >90)
Glucose, Bld: 125 mg/dL — ABNORMAL HIGH (ref 70–99)
Potassium: 4 mEq/L (ref 3.7–5.3)
SODIUM: 136 meq/L — AB (ref 137–147)
Total Protein: 6.4 g/dL (ref 6.0–8.3)

## 2014-05-17 LAB — TROPONIN I: Troponin I: <0.3 ng/mL (ref <0.30)

## 2014-05-17 LAB — PROTIME-INR
INR: 3.31 — ABNORMAL HIGH (ref 0.00–1.49)
Prothrombin Time: 33.9 seconds — ABNORMAL HIGH (ref 11.6–15.2)

## 2014-05-17 MED ORDER — LEVALBUTEROL HCL 1.25 MG/0.5ML IN NEBU
1.2500 mg | INHALATION_SOLUTION | Freq: Three times a day (TID) | RESPIRATORY_TRACT | Status: DC
  Administered 2014-05-17 – 2014-05-18 (×3): 1.25 mg via RESPIRATORY_TRACT
  Filled 2014-05-17 (×5): qty 0.5

## 2014-05-17 MED ORDER — IPRATROPIUM-ALBUTEROL 0.5-2.5 (3) MG/3ML IN SOLN: 3.0000 mL | Freq: Three times a day (TID) | RESPIRATORY_TRACT | Status: DC

## 2014-05-17 MED ORDER — IPRATROPIUM-ALBUTEROL 0.5-2.5 (3) MG/3ML IN SOLN
3.0000 mL | Freq: Once | RESPIRATORY_TRACT | Status: AC
Start: 2014-05-17 — End: 2014-05-17
  Administered 2014-05-17: 3 mL via RESPIRATORY_TRACT
  Filled 2014-05-17: qty 3

## 2014-05-17 MED ORDER — SODIUM CHLORIDE 0.9 % IV SOLN
INTRAVENOUS | Status: DC
  Administered 2014-05-17: 19:00:00 via INTRAVENOUS

## 2014-05-17 MED ORDER — ACETAMINOPHEN 500 MG PO TABS
1000.0000 mg | ORAL_TABLET | Freq: Three times a day (TID) | ORAL | Status: DC
  Administered 2014-05-17 – 2014-05-20 (×9): 1000 mg via ORAL
  Filled 2014-05-17 (×10): qty 2

## 2014-05-17 MED ORDER — TRAMADOL HCL 50 MG PO TABS: 50.0000 mg | ORAL_TABLET | Freq: Four times a day (QID) | ORAL | Status: DC | PRN

## 2014-05-17 MED ORDER — IPRATROPIUM BROMIDE 0.02 % IN SOLN
0.5000 mg | Freq: Three times a day (TID) | RESPIRATORY_TRACT | Status: DC
  Administered 2014-05-17 – 2014-05-18 (×3): 0.5 mg via RESPIRATORY_TRACT
  Filled 2014-05-17 (×3): qty 2.5

## 2014-05-17 MED ORDER — HYDROCODONE-ACETAMINOPHEN 5-325 MG PO TABS: 1.0000 | ORAL_TABLET | ORAL | Status: DC | PRN

## 2014-05-17 NOTE — Plan of Care (Signed)
Problem: Phase III Progression Outcomes Goal: Pain controlled on oral analgesia Outcome: Completed/Met Date Met:  05/17/14 Goal: Activity at appropriate level-compared to baseline (UP IN CHAIR FOR HEMODIALYSIS)  Outcome: Completed/Met Date Met:  05/17/14 Goal: IV/normal saline lock discontinued Outcome: Completed/Met Date Met:  05/17/14 Goal: Discharge plan remains appropriate-arrangements made Outcome: Completed/Met Date Met:  05/17/14 Goal: Other Phase III Outcomes/Goals Outcome: Not Applicable Date Met:  12/50/87

## 2014-05-17 NOTE — Progress Notes (Signed)
PAtient transferred from 6east to 1426, patient alert and oriented but slow to respond, still sleepy but will wakeup and answer question. oriented patient to room/unit and reviewed plan of care with patient/family. Tele applied patient in A-fib. Will continue to monitor patient.

## 2014-05-17 NOTE — Progress Notes (Signed)
[facility] for Warfarin Indication: atrial fibrillation  Allergies  Allergen Reactions  . Hctz [Hydrochlorothiazide]   . Sulfa Antibiotics   . Tetracyclines & Related     Patient Measurements: Height: 5' 5.25" (165.7 cm) Weight: 217 lb (98.431 kg) IBW/kg (Calculated) : 57.58  Vital Signs: Temp: 98.9 F (37.2 C) (11/08 0554) Temp Source: Oral (11/08 0554) BP: 112/82 mmHg (11/08 0554) Pulse Rate: 94 (11/08 0554)  Labs:  Recent Labs  05/15/14 1122 05/16/14 0514 05/17/14 0617  HGB 13.2  --   --   HCT 39.4  --   --   PLT 234  --   --   LABPROT 25.5* 26.7* 33.9*  INR 2.30* 2.44* 3.31*  CREATININE 1.23* 1.25*  --     Estimated Creatinine Clearance: 40.5 mL/min (by C-G formula based on Cr of 1.25).   Assessment: As obtained from the medical record, patient is an 80 y/o F with PMH of COPD, obesity, atrial fibrillation on warfarin, CHF, AAA, HTN, HLD, gout, and active smoker, who presented to the ED with mid back and R flank pain x 1 day after a mechanical fall yesterday. X-ray of the thoracic spine showed likely a new compression fracture of T12 and fracture of T3 of indeterminate duration. Pharmacy consulted to assist with Warfarin dosing for a-fib.    Home Warfarin regimen:  Warfarin 2.5 mg daily except 5 mg on Wednesdays  Today, 11/8:  INR = 3.31 (SUPRAtherapeutic).  CBC WNL 11/6  Drug interactions:  Allopurinol, Fenofibrate, Citalopram (all home meds)  Diet: carb modified - decreased intake  Goal of Therapy:  INR 2-3 Monitor platelets by anticoagulation protocol: Yes   Plan:   INR has jumped up to supratherapeutic level - hold dose tonight. On home dosing but PO intake appears decreased (unsure how much normally eats) but may be possible cause of INR jump.   Monitor PO intake, drug interactions, daily PT/INR.  Monitor CBC and for signs/symptoms of bleeding.  From notes, it does not appear procedure (ex  vertebroplasty,etc.) will be performed   Doreene Eland, PharmD, BCPS.   Pager: [phone] 05/17/2014 9:37 AM

## 2014-05-17 NOTE — Progress Notes (Addendum)
[facility] PROGRESS NOTE  RIDLEY DILEO DJM:[id number] DOB: 25-Apr-1930 DOA: 05/15/2014 PCP: Alesia Richards, MD  Assessment/Plan  T12 compression fracture  -  thoracolumbar corset prn -  home health PT/OT/RN and aid - vitamin D level 68 a few months ago.   Presyncope, + orthostatics -  Repeat electrolytes -  Hold lasix -  Start IVF x 12 hours, then stop (judicious use due to hx of systolic heart failure, EF 45%)  Atrial fibrillation, rate erratic this afternoon, as low as 40s and as high as 140s -  ECG -  Cycle troponins -  Telemetry -  INR supratherapeutic, dosing per pharmacy  -  Continued metoprolol, Cardizem, but hold for SBP < 100 -  Minimize medications that can cause tachycardia (given duoneb this AM)  Hypertension/HLD, BP well controlled, but + orthostatics -  Continue Cardizem, statin, metoprolol  Chronic systolic heart failure, EF 40-45%, appears dry and is orthostatic -  Lasix and IVF as above  COPD (chronic obstructive pulmonary disease),  -  xopenex + ipratropium nebs TID -  Continued Singulair  Type 2 diabetes mellitus w/ renal manifestations - Last A1c 7.2.    Stage 4 CKD, remained stable.   Abdominal aortic aneurysm, infrarenal abdominal aortic aneurysm of 5.4 cm as per CT scan in 2014. As per PCP notes she has refused for surgery. Follows with Dr. Kellie Simmering  GERD and gout, stable, continued PPI and allopurinol  Tobacco abuse Counseled on cessation Refused nicotine patch  DIET:  Diabetic, healthy heart ACCESS:  PIV IVF:  Yes PROPH:  warfarin   HPI/Subjective:  Slept well.  Pain controlled.  Felt faint with PT today.  vicodin is making her feel unwell.  Denies CP, SOB, nausea, vomiting, lightheadedness.  did not eat and drink very well this morning.  Orthostatics positive.    Objective: Filed Vitals:   05/17/14 1030 05/17/14 1342 05/17/14 1345 05/17/14 1347  BP:  111/62 120/83 83/61  Pulse:  93 140 92  Temp:  98.9 F (37.2 C)  98.2 F (36.8 C) 98.3 F (36.8 C)  TempSrc:  Oral Oral Oral  Resp:  _0 Height:      Weight:      SpO2: 93% 92% 93% 97%    Intake/Output Summary (Last 24 hours) at 05/17/14 1604 Last data filed at 05/17/14 1304  Gross per 24 hour  Intake    960 ml  Output    100 ml  Net    860 ml   Filed Weights   05/15/14 1719  Weight: 98.431 kg (217 lb)    Exam:  General: WF, NAD HEENT: NCAT, MMM Cardiovascular: RRR, no mrg, 2+ pulses Respiratory:  Upon initial exam this AM she was wheezy, but upon repeat exam, CTAB ABD: NABS, soft, ND, NT MSK: No LEE, normal tone and bulk NEURO: 5/5 bilateral upper and lower extremity strength, sensation intact, brisk bilateral reflexes.  No facial droop  Data Reviewed: Basic Metabolic Panel:  Recent Labs Lab 05/15/14 1122 05/16/14 0514 05/17/14 1446  NA 134* 136* 136*  K 4.2 3.6* 4.0  CL 94* 96 96  CO2 _1 GLUCOSE 112* 121* 125*  BUN 23 22 26*  CREATININE 1.23* 1.25* 1.45*  CALCIUM 9.4 9.5 9.1   Liver Function Tests:  Recent Labs Lab 05/17/14 1446  AST 31  ALT 21  ALKPHOS 68  BILITOT 0.6  PROT 6.4  ALBUMIN 2.9*   No results for input(s): LIPASE, AMYLASE in the last  168 hours. No results for input(s): AMMONIA in the last 168 hours. CBC:  Recent Labs Lab 05/15/14 1122 05/17/14 1446  WBC 10.0 7.3  NEUTROABS 7.9* 5.2  HGB 13.2 12.7  HCT 39.4 38.5  MCV 92.7 93.7  PLT 234 231   Cardiac Enzymes:  Recent Labs Lab 05/17/14 1446  TROPONINI <0.30   BNP (last 3 results)  Recent Labs  12/04/13 1428  PROBNP 10429.0*   CBG:  Recent Labs Lab 05/16/14 1220 05/16/14 1741 05/16/14 2134 05/17/14 0714 05/17/14 1217  GLUCAP 129* 124* 154* 121* 141*    No results found for this or any previous visit (from the past 240 hour(s)).   Studies: No results found.  Scheduled Meds: . acetaminophen  1,000 mg Oral TID  . allopurinol  300 mg Oral Daily  . cholecalciferol  5,000 Units Oral Daily  .  citalopram  20 mg Oral Daily  . diltiazem  240 mg Oral Daily  . docusate sodium  100 mg Oral BID  . fenofibrate  160 mg Oral Daily  . insulin aspart  0-15 Units Subcutaneous TID WC  . insulin aspart  0-5 Units Subcutaneous QHS  . ipratropium  0.5 mg Nebulization TID  . levalbuterol  1.25 mg Nebulization TID  . magnesium oxide  200 mg Oral Daily  . metoprolol succinate  100 mg Oral Daily  . montelukast  10 mg Oral QHS  . multivitamin with minerals  1 tablet Oral Daily  . pantoprazole  40 mg Oral Daily  . potassium chloride  10 mEq Oral BID  . rosuvastatin  5 mg Oral QHS  . Warfarin - Pharmacist Dosing Inpatient   Does not apply q1800   Continuous Infusions: . sodium chloride      Principal Problem:   T12 compression fracture Active Problems:   Hypertension   COPD (chronic obstructive pulmonary disease)   T2_NIDDM w/Stage 4 CKD (GFR 28 ml/min)   Obesity (BMI 38.76)   CKD (chronic kidney disease)   Back pain   Controlled atrial fibrillation   Tobacco abuse   CKD (chronic kidney disease), stage IV   AAA (abdominal aortic aneurysm)   Thoracic compression fracture    Time spent: 30 min    Ruthell Feigenbaum, [facility] Pager [phone]. If 7PM-7AM, please contact night-coverage at [email or web address], password [facility] 05/17/2014, 4:04 PM  LOS: 2 days

## 2014-05-17 NOTE — Progress Notes (Addendum)
Physical Therapy Treatment Patient Details Name: Heather Stevenson MRN: [id number] DOB: 1930-06-30 Today's Date: 05/17/2014    History of Present Illness 80 year old obese female with history of COPD, active smoker, A. Fib on Coumadin, history of CHF, history of AAA of 5.5 cm (refused surgery),hypertension, hyperlipidemia, gout, admitted 05/15/14 after a mechanical fall with onset of back pain.   Thoracic xray shows T12 compression fx and pt refused to finish MRI per progress notes.    PT Comments    Pt with very unsteady gait today and reports feeling "loopy" from pain meds. Feel pt is unsafe to d/c home at this time as she presents as high fall risk.  May need increased care upon d/c as she lives alone and daughter not able to stay with pt upon d/c.  Vitals taken once back in room as below.   Follow Up Recommendations  Supervision for mobility/OOB;SNF     Equipment Recommendations  Rolling walker with 5" wheels (pt reports 4 wheeled RW, 2 wheeled RW would be safest option)    Recommendations for Other Services       Precautions / Restrictions Precautions Precautions: Back Precaution Comments: pt educated on back precautions for pain control Required Braces or Orthoses: Other Brace/Splint Other Brace/Splint: has 3 point brace already on upon arrival    Mobility  Bed Mobility Overal bed mobility: Needs Assistance Bed Mobility: Rolling;Sidelying to Sit Rolling: Supervision Sidelying to sit: Supervision;HOB elevated       General bed mobility comments: verbal cues for log roll technique, required no assist today  Transfers Overall transfer level: Needs assistance Equipment used: Rolling walker (2 wheeled) Transfers: Sit to/from Stand Sit to Stand: Min guard;Min assist         General transfer comment: verbal cues for safe technique within back precautions, increased time, assist required when pt not feeling well during ambulation  Ambulation/Gait Ambulation/Gait  assistance: Min assist Ambulation Distance (Feet): 25 Feet Assistive device: Rolling walker (2 wheeled) Gait Pattern/deviations: Step-through pattern;Staggering right;Staggering left Gait velocity: decr   General Gait Details: pt with very unsteady gait with cues to use RW, gait became worse and pt increased fall risk so had pt stop and wait for recliner to sit down, she denied dizziness at time but reports feeling "loopy" from pain meds   Stairs            Wheelchair Mobility    Modified Rankin (Stroke Patients Only)       Balance                                    Cognition Arousal/Alertness: Awake/alert Behavior During Therapy: WFL for tasks assessed/performed Overall Cognitive Status: Within Functional Limits for tasks assessed                      Exercises      General Comments        Pertinent Vitals/Pain Pain Assessment: No/denies pain Pain Intervention(s): Limited activity within patient's tolerance;Monitored during session;Premedicated before session;Repositioned   Vitals: 121/49 mmHg, 88 bpm, SpO2 98% room air upon end of session    Home Living                      Prior Function            PT Goals (current goals can now be found in the care plan section) Progress towards  PT goals: Progressing toward goals    Frequency  Min 3X/week    PT Plan Current plan remains appropriate    Co-evaluation             End of Session Equipment Utilized During Treatment: Back brace Activity Tolerance: Patient tolerated treatment well Patient left: in chair;with call bell/phone within reach     Time: 1159-1215 PT Time Calculation (min): 16 min  Charges:  $Gait Training: 8-22 mins                    G Codes:      Sanah Kraska,KATHrine E May 19, 2014, 3:04 PM Carmelia Bake, PT, DPT May 19, 2014 Pager: [phone]

## 2014-05-18 ENCOUNTER — Observation Stay (HOSPITAL_COMMUNITY): Payer: Medicare Other

## 2014-05-18 DIAGNOSIS — F419 Anxiety disorder, unspecified: Secondary | ICD-10-CM | POA: Diagnosis present

## 2014-05-18 DIAGNOSIS — E785 Hyperlipidemia, unspecified: Secondary | ICD-10-CM | POA: Diagnosis present

## 2014-05-18 DIAGNOSIS — M109 Gout, unspecified: Secondary | ICD-10-CM | POA: Diagnosis present

## 2014-05-18 DIAGNOSIS — J449 Chronic obstructive pulmonary disease, unspecified: Secondary | ICD-10-CM | POA: Diagnosis present

## 2014-05-18 DIAGNOSIS — I4891 Unspecified atrial fibrillation: Secondary | ICD-10-CM | POA: Diagnosis present

## 2014-05-18 DIAGNOSIS — Z888 Allergy status to other drugs, medicaments and biological substances status: Secondary | ICD-10-CM | POA: Diagnosis not present

## 2014-05-18 DIAGNOSIS — Z809 Family history of malignant neoplasm, unspecified: Secondary | ICD-10-CM | POA: Diagnosis not present

## 2014-05-18 DIAGNOSIS — I129 Hypertensive chronic kidney disease with stage 1 through stage 4 chronic kidney disease, or unspecified chronic kidney disease: Secondary | ICD-10-CM | POA: Diagnosis present

## 2014-05-18 DIAGNOSIS — K219 Gastro-esophageal reflux disease without esophagitis: Secondary | ICD-10-CM | POA: Diagnosis present

## 2014-05-18 DIAGNOSIS — R296 Repeated falls: Secondary | ICD-10-CM | POA: Diagnosis present

## 2014-05-18 DIAGNOSIS — Z7901 Long term (current) use of anticoagulants: Secondary | ICD-10-CM | POA: Diagnosis not present

## 2014-05-18 DIAGNOSIS — E780 Pure hypercholesterolemia: Secondary | ICD-10-CM | POA: Diagnosis present

## 2014-05-18 DIAGNOSIS — I5022 Chronic systolic (congestive) heart failure: Secondary | ICD-10-CM | POA: Diagnosis present

## 2014-05-18 DIAGNOSIS — E669 Obesity, unspecified: Secondary | ICD-10-CM | POA: Diagnosis present

## 2014-05-18 DIAGNOSIS — M4854XD Collapsed vertebra, not elsewhere classified, thoracic region, subsequent encounter for fracture with routine healing: Secondary | ICD-10-CM

## 2014-05-18 DIAGNOSIS — E119 Type 2 diabetes mellitus without complications: Secondary | ICD-10-CM | POA: Diagnosis present

## 2014-05-18 DIAGNOSIS — M1990 Unspecified osteoarthritis, unspecified site: Secondary | ICD-10-CM | POA: Diagnosis present

## 2014-05-18 DIAGNOSIS — S22089A Unspecified fracture of T11-T12 vertebra, initial encounter for closed fracture: Secondary | ICD-10-CM | POA: Diagnosis present

## 2014-05-18 DIAGNOSIS — E871 Hypo-osmolality and hyponatremia: Secondary | ICD-10-CM | POA: Diagnosis present

## 2014-05-18 DIAGNOSIS — W010XXA Fall on same level from slipping, tripping and stumbling without subsequent striking against object, initial encounter: Secondary | ICD-10-CM | POA: Diagnosis present

## 2014-05-18 DIAGNOSIS — Z881 Allergy status to other antibiotic agents status: Secondary | ICD-10-CM | POA: Diagnosis not present

## 2014-05-18 DIAGNOSIS — I714 Abdominal aortic aneurysm, without rupture: Secondary | ICD-10-CM | POA: Diagnosis present

## 2014-05-18 DIAGNOSIS — E860 Dehydration: Secondary | ICD-10-CM | POA: Diagnosis present

## 2014-05-18 DIAGNOSIS — N184 Chronic kidney disease, stage 4 (severe): Secondary | ICD-10-CM | POA: Diagnosis present

## 2014-05-18 DIAGNOSIS — F17210 Nicotine dependence, cigarettes, uncomplicated: Secondary | ICD-10-CM | POA: Diagnosis present

## 2014-05-18 DIAGNOSIS — I272 Other secondary pulmonary hypertension: Secondary | ICD-10-CM | POA: Diagnosis present

## 2014-05-18 DIAGNOSIS — M549 Dorsalgia, unspecified: Secondary | ICD-10-CM | POA: Diagnosis present

## 2014-05-18 DIAGNOSIS — R55 Syncope and collapse: Secondary | ICD-10-CM | POA: Diagnosis present

## 2014-05-18 DIAGNOSIS — Z8249 Family history of ischemic heart disease and other diseases of the circulatory system: Secondary | ICD-10-CM | POA: Diagnosis not present

## 2014-05-18 DIAGNOSIS — J9601 Acute respiratory failure with hypoxia: Secondary | ICD-10-CM | POA: Diagnosis present

## 2014-05-18 DIAGNOSIS — R29890 Loss of height: Secondary | ICD-10-CM | POA: Diagnosis present

## 2014-05-18 DIAGNOSIS — Z79899 Other long term (current) drug therapy: Secondary | ICD-10-CM | POA: Diagnosis not present

## 2014-05-18 DIAGNOSIS — Z6838 Body mass index (BMI) 38.0-38.9, adult: Secondary | ICD-10-CM | POA: Diagnosis not present

## 2014-05-18 DIAGNOSIS — M25559 Pain in unspecified hip: Secondary | ICD-10-CM | POA: Diagnosis present

## 2014-05-18 LAB — PROTIME-INR
INR: 3.99 — AB (ref 0.00–1.49)
PROTHROMBIN TIME: 39.2 s — AB (ref 11.6–15.2)

## 2014-05-18 LAB — TROPONIN I

## 2014-05-18 LAB — GLUCOSE, CAPILLARY
GLUCOSE-CAPILLARY: 108 mg/dL — AB (ref 70–99)
GLUCOSE-CAPILLARY: 106 mg/dL — AB (ref 70–99)
GLUCOSE-CAPILLARY: 139 mg/dL — AB (ref 70–99)
Glucose-Capillary: 161 mg/dL — ABNORMAL HIGH (ref 70–99)
Glucose-Capillary: 151 mg/dL — ABNORMAL HIGH (ref 70–99)

## 2014-05-18 MED ORDER — DILTIAZEM HCL ER COATED BEADS 180 MG PO CP24
180.0000 mg | ORAL_CAPSULE | Freq: Every day | ORAL | Status: DC
  Administered 2014-05-19 – 2014-05-20 (×2): 180 mg via ORAL
  Filled 2014-05-18 (×2): qty 1

## 2014-05-18 MED ORDER — AZITHROMYCIN 250 MG PO TABS: 250.0000 mg | ORAL_TABLET | Freq: Every day | ORAL | Status: DC

## 2014-05-18 MED ORDER — LEVALBUTEROL HCL 1.25 MG/0.5ML IN NEBU
1.2500 mg | INHALATION_SOLUTION | Freq: Four times a day (QID) | RESPIRATORY_TRACT | Status: DC | PRN
  Filled 2014-05-18: qty 0.5

## 2014-05-18 MED ORDER — AZITHROMYCIN 500 MG PO TABS: 500.0000 mg | ORAL_TABLET | Freq: Every day | ORAL | Status: DC

## 2014-05-18 MED ORDER — PREDNISONE 10 MG PO TABS: 60.0000 mg | ORAL_TABLET | Freq: Every day | ORAL | Status: DC

## 2014-05-18 NOTE — Progress Notes (Signed)
[place] for Warfarin Indication: atrial fibrillation   Assessment: 80 y/o F with PMH of COPD, obesity, atrial fibrillation on warfarin, CHF, AAA, HTN, HLD, gout, and active smoker, who presented to the ED with mid back and R flank pain x 1 day after a mechanical fall yesterday. X-ray of the thoracic spine showed likely a new compression fracture of T12 and fracture of T3 of indeterminate duration. Pharmacy consulted to assist with Warfarin dosing for a-fib.    Home Warfarin regimen:  Warfarin 2.5 mg daily except 5 mg on Wednesdays  Today, 11/8:  INR = 3.99 (SUPRAtherapeutic) but no bleeding noted per nursing.  Cause of rise not clear but may partly be due to decreased PO intake  CBC WNL on 11/8  Drug interactions:  Allopurinol, Fenofibrate, Citalopram (all home meds)  Eating 60-75% of meals now (previously 0%)  Goal of Therapy:  INR 2-3 Monitor platelets by anticoagulation protocol: Yes   Plan:   Hold warfarin again tonight.     Monitor PO intake, drug interactions, daily PT/INR.  Monitor CBC and for signs/symptoms of bleeding.  From notes, it does not appear procedure (ex vertebroplasty,etc.) will be performed in the near future    Allergies  Allergen Reactions  . Hctz [Hydrochlorothiazide]   . Sulfa Antibiotics   . Tetracyclines & Related     Patient Measurements: Height: 5' 5.25" (165.7 cm) Weight: 217 lb (98.431 kg) IBW/kg (Calculated) : 57.58  Vital Signs: Temp: 97.8 F (36.6 C) (11/09 0523) Temp Source: Oral (11/09 0523) BP: 139/91 mmHg (11/09 1021) Pulse Rate: 96 (11/09 1021)  Labs:  Recent Labs  05/15/14 1122 05/16/14 0514 05/17/14 0617 05/17/14 1446 05/17/14 2040 05/18/14 0205  HGB 13.2  --   --  12.7  --   --   HCT 39.4  --   --  38.5  --   --   PLT 234  --   --  231  --   --   LABPROT 25.5* 26.7* 33.9*  --   --  39.2*  INR 2.30* 2.44* 3.31*  --   --  3.99*  CREATININE 1.23* 1.25*  --  1.45*  --    --   TROPONINI  --   --   --  <0.30 <0.30 <0.30    Estimated Creatinine Clearance: 34.9 mL/min (by C-G formula based on Cr of 1.45).   Reuel Boom, PharmD Pager: [phone] 05/18/2014, 10:58 AM

## 2014-05-18 NOTE — Progress Notes (Signed)
CSW received consult for SNF placement. RNCM, Cookie spoke with patient & confirmed patient's plans to return home at discharge.   No further CSW needs identified - CSW signing off.   Raynaldo Opitz, [facility] Clinical Social Worker cell #: [phone]

## 2014-05-18 NOTE — Progress Notes (Signed)
UR completed.

## 2014-05-18 NOTE — Progress Notes (Addendum)
[facility] PROGRESS NOTE  Heather Stevenson YQM:[id number] DOB: [birth year] DOA: 05/15/2014 PCP: Alesia Richards, MD  Assessment/Plan  T12 compression fracture  -  thoracolumbar corset prn -  Awaiting reevaluation by PT.  May need SNF vs. Home to ILF with home health PT/OT/RN and aid -  vitamin D level 68 a few months ago.   Presyncope, + orthostatics, held lasix and gave IVF -  Repeat electrolytes, appear somewhat dry -  Minimize narcotic pain medication -  Hydrated and awaiting repeat orthostatics this AM  Atrial fibrillation, yesterday while not on telemetry, reported as erratic -  ECG, stable -  Troponins neg -  Telemetry:  Rate controlled a-fib -  INR supratherapeutic, dosing per pharmacy  -  Continued metoprolol, Cardizem, but hold for SBP < 100  Hypertension/HLD, BP well controlled, but + orthostatics -  Continue Cardizem, statin, metoprolol  Chronic systolic heart failure, EF 40-45%, appears dry and is orthostatic -  Lasix and IVF as above  Acute COPD (chronic obstructive pulmonary disease) exacerbatin -  xopenex + ipratropium nebs TID -  Continued Singulair  Type 2 diabetes mellitus w/ renal manifestations, CBG well controlled - Last A1c 7.2.   -  Continue mod dose SSI  Stage 4 CKD, remained stable.   Abdominal aortic aneurysm, infrarenal abdominal aortic aneurysm of 5.4 cm as per CT scan in 2014. As per PCP notes she has refused for surgery. Follows with Dr. Kellie Simmering  GERD and gout, stable, continued PPI and allopurinol  Tobacco abuse Counseled on cessation Refused nicotine patch  DIET:  Diabetic, healthy heart ACCESS:  PIV IVF:  Yes PROPH:  warfarin   HPI/Subjective:  Has nonproductive cough, denies SOB.  Feels a little wheezy but not worse than baseline, breathing treatments helping.  Less lightheaded when getting up to [facility].    Objective: Filed Vitals:   05/17/14 2105 05/17/14 2117 05/17/14 2129 05/18/14 0523  BP:   128/82 112/70   Pulse:   88 74  Temp:   98.1 F (36.7 C) 97.8 F (36.6 C)  TempSrc:   Oral Oral  Resp:   18 18  Height:      Weight:      SpO2: 84% 94%  96%    Intake/Output Summary (Last 24 hours) at 05/18/14 8891 Last data filed at 05/18/14 0753  Gross per 24 hour  Intake    480 ml  Output    500 ml  Net    -20 ml   Filed Weights   05/15/14 1719  Weight: 98.431 kg (217 lb)    Exam:  General: WF, NAD HEENT: NCAT, MMM Cardiovascular: IRRR, no mrg, 2+ pulses Respiratory:  Wheezy bilaterally, + rhonchi, no focal rales ABD: NABS, soft, ND, mild TTP in the left lateral abdominal quadrants and left flank MSK: No LEE, normal tone and bulk NEURO: 5/5 bilateral upper and lower extremity strength, sensation intact, brisk bilateral reflexes.  No facial droop  Data Reviewed: Basic Metabolic Panel:  Recent Labs Lab 05/15/14 1122 05/16/14 0514 05/17/14 1446  NA 134* 136* 136*  K 4.2 3.6* 4.0  CL 94* 96 96  CO2 _0 GLUCOSE 112* 121* 125*  BUN 23 22 26*  CREATININE 1.23* 1.25* 1.45*  CALCIUM 9.4 9.5 9.1   Liver Function Tests:  Recent Labs Lab 05/17/14 1446  AST 31  ALT 21  ALKPHOS 68  BILITOT 0.6  PROT 6.4  ALBUMIN 2.9*   No results for input(s): LIPASE, AMYLASE in the last  168 hours. No results for input(s): AMMONIA in the last 168 hours. CBC:  Recent Labs Lab 05/15/14 1122 05/17/14 1446  WBC 10.0 7.3  NEUTROABS 7.9* 5.2  HGB 13.2 12.7  HCT 39.4 38.5  MCV 92.7 93.7  PLT 234 231   Cardiac Enzymes:  Recent Labs Lab 05/17/14 1446 05/17/14 2040 05/18/14 0205  TROPONINI <0.30 <0.30 <0.30   BNP (last 3 results)  Recent Labs  12/04/13 1428  PROBNP 10429.0*   CBG:  Recent Labs Lab 05/17/14 0714 05/17/14 1217 05/17/14 1816 05/17/14 2132 05/18/14 0745  GLUCAP 121* 141* 127* 161* 108*    No results found for this or any previous visit (from the past 240 hour(s)).   Studies: Dg Chest Port 1 View  05/18/2014   CLINICAL DATA:  Dyspnea.   EXAM: PORTABLE CHEST - 1 VIEW  COMPARISON:  05/15/2014.  FINDINGS: Trachea is midline. Heart is mildly enlarged. Biapical pleural parenchymal scarring. Mild interstitial prominence is seen diffusely. Linear scarring or subsegmental atelectasis at the right lung base. No definite pleural fluid.  IMPRESSION: Mild interstitial prominence may be technical in etiology. Difficult to exclude mild edema.   Electronically Signed   By: Lorin Picket M.D.   On: 05/18/2014 08:14    Scheduled Meds: . acetaminophen  1,000 mg Oral TID  . allopurinol  300 mg Oral Daily  . cholecalciferol  5,000 Units Oral Daily  . citalopram  20 mg Oral Daily  . diltiazem  240 mg Oral Daily  . docusate sodium  100 mg Oral BID  . fenofibrate  160 mg Oral Daily  . insulin aspart  0-15 Units Subcutaneous TID WC  . insulin aspart  0-5 Units Subcutaneous QHS  . ipratropium  0.5 mg Nebulization TID  . levalbuterol  1.25 mg Nebulization TID  . magnesium oxide  200 mg Oral Daily  . metoprolol succinate  100 mg Oral Daily  . montelukast  10 mg Oral QHS  . multivitamin with minerals  1 tablet Oral Daily  . pantoprazole  40 mg Oral Daily  . potassium chloride  10 mEq Oral BID  . rosuvastatin  5 mg Oral QHS  . Warfarin - Pharmacist Dosing Inpatient   Does not apply q1800   Continuous Infusions:    Principal Problem:   T12 compression fracture Active Problems:   Hypertension   COPD (chronic obstructive pulmonary disease)   T2_NIDDM w/Stage 4 CKD (GFR 28 ml/min)   Obesity (BMI 38.76)   CKD (chronic kidney disease)   Back pain   Controlled atrial fibrillation   Tobacco abuse   CKD (chronic kidney disease), stage IV   AAA (abdominal aortic aneurysm)   Thoracic compression fracture   Orthostatic hypotension    Time spent: 30 min    Heather Stevenson, [place] [facility] Pager [phone]. If 7PM-7AM, please contact night-coverage at [email or web address], password [facility] 05/18/2014, 8:22 AM  LOS: 3 days

## 2014-05-18 NOTE — Progress Notes (Signed)
Physical Therapy Treatment Patient Details Name: Heather Stevenson MRN: [id number] DOB: Jan 29, 1930 Today's Date: 05/18/2014    History of Present Illness 80 year old obese female with history of COPD, active smoker, A. Fib on Coumadin, history of CHF, history of AAA of 5.5 cm (refused surgery),hypertension, hyperlipidemia, gout, admitted 05/15/14 after a mechanical fall with onset of back pain.   Thoracic xray shows T12 compression fx and pt refused to finish MRI per progress notes.    PT Comments    Patient was able to increase gait tolerance to 40 feet x2 with one sitting rest break on RA.  Pt is unsteady and unsafe with gait; she is limited by fatigue.  Pt demo poor recall of bed mobility log rolling techniques in order to maintain back precautions.    Follow Up Recommendations  Supervision for mobility/OOB;SNF     Equipment Recommendations  Rolling walker with 5" wheels    Recommendations for Other Services       Precautions / Restrictions Precautions Precautions: Back Required Braces or Orthoses: Other Brace/Splint Other Brace/Splint: has 3 point brace already on upon arrival Restrictions Weight Bearing Restrictions: No    Mobility  Bed Mobility Overal bed mobility: Needs Assistance Bed Mobility: Sidelying to Sit   Sidelying to sit: Min assist       General bed mobility comments: VCs for log rolling technique; pt required A with LE  Transfers Overall transfer level: Needs assistance Equipment used: Rolling walker (2 wheeled) Transfers: Sit to/from Stand (x2) Sit to Stand: Min assist         General transfer comment: VCs for foot/hand placement and sequencing; pt requires increased time  Ambulation/Gait Ambulation/Gait assistance: Min assist Ambulation Distance (Feet): 40 Feet (x2; pt required one sitting rest break ) Assistive device: Rolling walker (2 wheeled) Gait Pattern/deviations: Step-through pattern;Staggering right;Staggering left;Drifts  right/left;Antalgic Gait velocity: decr   General Gait Details: pt is unsteady and shaky with gait; pt demo decreased safety awareness and tendency to drift R/L; pt is a high fall risk; pt is limited by fatigue; pt on RA, vitals WNL   Stairs            Wheelchair Mobility    Modified Rankin (Stroke Patients Only)       Balance                                    Cognition Arousal/Alertness: Awake/alert Behavior During Therapy: WFL for tasks assessed/performed Overall Cognitive Status: Within Functional Limits for tasks assessed                      Exercises      General Comments        Pertinent Vitals/Pain Pain Assessment: 0-10 Pain Score: 8  (Pain was rated 8/10 pre tx with pt sidelying; 0/10 post tx) Pain Location: back Pain Intervention(s): Monitored during session;Repositioned    Home Living                      Prior Function            PT Goals (current goals can now be found in the care plan section) Progress towards PT goals: Progressing toward goals    Frequency  Min 3X/week    PT Plan Current plan remains appropriate    Co-evaluation             End of  Session Equipment Utilized During Treatment: Back brace;Gait belt Activity Tolerance: Patient tolerated treatment well Patient left: in bed;with call bell/phone within reach;with bed alarm set     Time: 5051-8335 PT Time Calculation (min): 29 min  Charges:                       G Codes:      Miller,Derrick, SPTA 05/18/2014, 1:02 PM   Reviewed above  Rica Koyanagi  PTA [facility]  Acute  Rehab Pager      [phone]

## 2014-05-19 LAB — BASIC METABOLIC PANEL
Anion gap: 14 (ref 5–15)
BUN: 24 mg/dL — AB (ref 6–23)
CALCIUM: 9.3 mg/dL (ref 8.4–10.5)
CO2: 23 mEq/L (ref 19–32)
Chloride: 96 mEq/L (ref 96–112)
Creatinine, Ser: 1.4 mg/dL — ABNORMAL HIGH (ref 0.50–1.10)
GFR calc Af Amer: 39 mL/min — ABNORMAL LOW (ref >90)
GFR, EST NON AFRICAN AMERICAN: 34 mL/min — AB (ref >90)
GLUCOSE: 114 mg/dL — AB (ref 70–99)
Potassium: 4.3 mEq/L (ref 3.7–5.3)
Sodium: 133 mEq/L — ABNORMAL LOW (ref 137–147)

## 2014-05-19 LAB — CBC
HCT: 37.1 % (ref 36.0–46.0)
Hemoglobin: 12.6 g/dL (ref 12.0–15.0)
MCH: 31.3 pg (ref 26.0–34.0)
MCHC: 34 g/dL (ref 30.0–36.0)
MCV: 92.3 fL (ref 78.0–100.0)
Platelets: 238 10*3/uL (ref 150–400)
RBC: 4.02 MIL/uL (ref 3.87–5.11)
RDW: 15.1 % (ref 11.5–15.5)
WBC: 7.5 10*3/uL (ref 4.0–10.5)

## 2014-05-19 LAB — GLUCOSE, CAPILLARY
GLUCOSE-CAPILLARY: 106 mg/dL — AB (ref 70–99)
GLUCOSE-CAPILLARY: 108 mg/dL — AB (ref 70–99)
Glucose-Capillary: 117 mg/dL — ABNORMAL HIGH (ref 70–99)
Glucose-Capillary: 131 mg/dL — ABNORMAL HIGH (ref 70–99)

## 2014-05-19 LAB — PROTIME-INR
INR: 2.87 — ABNORMAL HIGH (ref 0.00–1.49)
Prothrombin Time: 30.3 seconds — ABNORMAL HIGH (ref 11.6–15.2)

## 2014-05-19 MED ORDER — TRAMADOL HCL 50 MG PO TABS: 50.0000 mg | ORAL_TABLET | Freq: Four times a day (QID) | ORAL | Status: DC | PRN

## 2014-05-19 NOTE — Plan of Care (Signed)
Problem: Discharge Progression Outcomes Goal: Discharge plan in place and appropriate Outcome: Completed/Met Date Met:  05/19/14 Goal: Pain controlled with appropriate interventions Outcome: Completed/Met Date Met:  05/19/14 Goal: Activity appropriate for discharge plan Outcome: Completed/Met Date Met:  05/19/14

## 2014-05-19 NOTE — Plan of Care (Signed)
Problem: Discharge Progression Outcomes Goal: Complications resolved/controlled Outcome: Completed/Met Date Met:  05/19/14 Goal: Other Discharge Outcomes/Goals Outcome: Completed/Met Date Met:  05/19/14

## 2014-05-19 NOTE — Plan of Care (Signed)
Problem: Consults Goal: General Medical Patient Education See Patient Education Module for specific education.  Outcome: Completed/Met Date Met:  05/19/14 Goal: Skin Care Protocol Initiated - if Braden Score 18 or less If consults are not indicated, leave blank or document N/A  Outcome: Not Applicable Date Met:  56/31/49 Goal: Diabetes Guidelines if Diabetic/Glucose > 140 If diabetic or lab glucose is > 140 mg/dl - Initiate Diabetes/Hyperglycemia Guidelines & Document Interventions  Outcome: Not Applicable Date Met:  70/26/37

## 2014-05-19 NOTE — Discharge Summary (Addendum)
Physician Discharge Summary  Heather Stevenson RUE:[id number] DOB: [birth year] DOA: 05/15/2014  PCP: Alesia Richards, MD  Admit date: 05/15/2014 Discharge date: 05/19/2014  Recommendations for Outpatient Follow-up:  1. HH PT/OT/RN/aid 2. PCP follow up in 1 week or sooner if needed.  Referral for kyphoplasty if pain not improving or adequately controlled.  3. BMP and INR check at next visit to f/u hyponatremia, kidney function and anticoagulation 4. Daily weights and resume lasix if she has weight gain.    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 Fib on Coumadin, history of CHF, history of AAA of 5.5 cm (refused surgery),hypertension, hyperlipidemia, gout, who presented to the ED after a mechanical fall the day prior to admission.  Two days prior to admission, she had a mechanical fall by tripping on her clothes, landing on her buttocks after which she was having pain in her left flank and mid back.  The day prior to admission, she had another mechanical fall onto her knees with difficulty getting up because of back and left flank pain.  She denied LOC, syncope, or recent illness.    Hospital Course:    T12 compression fracture with mild loss of height of T3 and T12 with  new compression fracture of T12 since June of 2014.  She had no compromise of bowels or bladder and no lower extremity symptoms.  She was admitted under observation for pain control.  Occupational therapy provided a thoracolumbar corset and physical therapy evaluated her and she was able to walk with some assistance to the nursing station.  She has been set up for home health PT/OT/RN and aid.  The family is unable to stay with her for the next few days and are planning to hire an aid to stay with her at her independent living facility.  Vitamin D level 68 a few months ago.    Presyncope, likely secondary to pain medication and mild dehydration.  Orthostatic vital signs were positive.  Her lasix were held and she was given IVF.  Her pain medication was changed to ultram which she did not require frequent doses of.  She improved and on the day of discharge, was able to walk with her walker to the bathroom without orthostasis or difficulty.  Recommend TED hose while up during the day.    Hypertension/HLD, blood pressure stable.  Continued Cardizem, statin, metoprolol and Lasix  Chronic systolic heart failure, EF 40-45%, appeared dry and is orthostatic.  Lasix were held and she was hydrated.  Recommend holding lasix and if she gains more than 3-lbs in one day or 5-lbs in 1 week, resume lasix and call primary care doctor's office.    Atrial fibrillation, rate controlled and INR therapeutic.  Continued metoprolol, Cardizem, and Coumadin  COPD (chronic obstructive pulmonary disease), had some initial acute hypoxic respiratory failure due to splinting and pain medication which quickly resolved.  Continued Singulair, albuterol.  Counseled smoking cessation. Refused nicotine patch.  Type 2 diabetes mellitus w/ renal manifestations Last A1c of 7.2. Not on any medications. Will place on sliding scale insulin and monitor.    Stage 4 CKD, remained stable.   Abdominal aortic aneurysm, infrarenal abdominal aortic  aneurysm of 5.4 cm as per CT scan in 2014. As per PCP notes she has refused for surgery. Follows with Dr. Kellie Simmering  GERD and gout, stable, continued PPI and allopurinol  Tobacco abuse Counseled on cessation Refused nicotine patch   Procedures:  CT head:  Atrophy with small vessel disease, no acute abnl  XR ribs:  No rib fracture or acute cardiopulmonary abnl  Left hip XR:  No fracture  Lumbar spine XR:  No bony abnl of lumbar spine  Thoracic spine XR:  Mild loss of height of T3 and T12. T3 change of uncertain age and I16 fracture new since 2014  Consultations:  Neurosurgery by phone  Discharge Exam: Filed Vitals:   05/19/14 0935  BP: 159/99  Pulse: 105  Temp:   Resp:    Filed Vitals:   05/18/14 2113 05/19/14 0537 05/19/14 0835 05/19/14 0935  BP: 130/99 132/92 159/99 159/99  Pulse: 111 85 105 105  Temp: 97.9 F (36.6 C) 98 F (36.7 C) 97.9 F (36.6 C)   TempSrc: Oral Oral Oral   Resp: _0 Height:      Weight:      SpO2: 93% 97% 94%     General: WF, NAD, sitting in chair, smiling and asking to go home HEENT:  NCAT, MMM Cardiovascular: RRR, no mrg, 2+ pulses Respiratory: CTAB but diminished throughout, no increased WOB ABD:  NABS, soft, ND, NT MSK:  No LEE, normal tone and bulk NEURO:  5/5 bilateral lower extremity strength, sensation intact, brisk bilateral reflexes   Discharge Instructions      Discharge Instructions    (HEART FAILURE PATIENTS) Call MD:  Anytime you have any of the following symptoms: 1) 3 pound weight gain in 24 hours or 5 pounds in 1 week 2) shortness of breath, with or without a dry hacking cough 3) swelling in the hands, feet or stomach 4) if you have to sleep on extra pillows at night in order to breathe.    Complete by:  As directed      Call MD for:  difficulty breathing, headache or visual disturbances    Complete by:  As directed      Call MD for:  extreme fatigue    Complete by:  As directed      Call MD for:  hives     Complete by:  As directed      Call MD for:  persistant dizziness or light-headedness    Complete by:  As directed      Call MD for:  persistant nausea and vomiting    Complete by:  As directed      Call MD for:  severe uncontrolled pain    Complete by:  As directed      Call MD for:  temperature >100.4    Complete by:  As directed      Diet - low sodium heart healthy    Complete by:  As directed      Diet Carb Modified    Complete by:  As directed  Discharge instructions    Complete by:  As directed   You were hospitalized with a fall and a compression fracture of your spine.  Please call 911 immediately if you develop numbness or tingling in your legs.  Use your brace during the day as needed for comfort.  You may take it off if you want during the day or at night for sleep.  Use the pain medication as sparingly as possible as it can cause sleepiness, increased risk of falls, constipation, and urinary retention.  Do NOT use naproxen, ibuprofen, goody's powders/aspirin for pain as these medications can cause kidney problems.  Tylenol is okay to use.  Follow up with your primary care doctor in about a week.  If your pain is not getting better, he may refer you for a procedure to help your back pain.  Please hold your lasix for now if you are not eating and drinking well.  Weigh yourself daily and if you gain more than 3-lbs in one day or 5-lbs in 1 week, resume your lasix and call your primary care doctor's office.  Also, if you notice increased leg swelling, please resume your lasix.     Driving Restrictions    Complete by:  As directed   No driving or operating heavy machinery while taking vicodin     Increase activity slowly    Complete by:  As directed             Medication List    STOP taking these medications        furosemide 20 MG tablet  Commonly known as:  LASIX      TAKE these medications        acetaminophen 500 MG tablet  Commonly known as:  TYLENOL  Take 500  mg by mouth every 6 (six) hours as needed for moderate pain.     allopurinol 300 MG tablet  Commonly known as:  ZYLOPRIM  TAKE 1 TABLET ONCE DAILY. FOR GOUT     CENTRUM tablet  Take 1 tablet by mouth daily.     citalopram 20 MG tablet  Commonly known as:  CELEXA  Take 1 tablet (20 mg total) by mouth daily.     [employer] VIT D 5000 HIGH-POTENCY PO  Take 5,000 Units by mouth daily.     dexlansoprazole 60 MG capsule  Commonly known as:  DEXILANT  Take 1 capsule (60 mg total) by mouth daily.     diltiazem 240 MG 24 hr capsule  Commonly known as:  CARDIZEM CD  Take 1 capsule (240 mg total) by mouth daily.     DSS 100 MG Caps  Take 100 mg by mouth 2 (two) times daily.     fenofibrate micronized 134 MG capsule  Commonly known as:  LOFIBRA  TAKE 1 CAPSULE ONCE DAILY FOR BLOOD FATS.     Magnesium 250 MG Tabs  Take 250 mg by mouth daily.     metoprolol succinate 100 MG 24 hr tablet  Commonly known as:  TOPROL-XL  Take 1 tablet (100 mg total) by mouth daily. Take with or immediately following a meal.     montelukast 10 MG tablet  Commonly known as:  SINGULAIR  Take 1 tablet (10 mg total) by mouth at bedtime.     potassium chloride 10 MEQ tablet  Commonly known as:  K-DUR  Take 1 tablet (10 mEq total) by mouth 2 (two) times daily.     rosuvastatin 5 MG tablet  Commonly  known as:  CRESTOR  Take 5 mg by mouth at bedtime.     traMADol 50 MG tablet  Commonly known as:  ULTRAM  Take 1 tablet (50 mg total) by mouth every 6 (six) hours as needed for moderate pain or severe pain.     warfarin 5 MG tablet  Commonly known as:  COUMADIN  Take 5 mg by mouth daily. Takes 1/2 tab 6 days a week and 1 tab on Wed       Follow-up Information    Follow up with Alesia Richards, MD. Schedule an appointment as soon as possible for a visit in 1 week.   Specialty:  Internal Medicine   Contact information:   [address] [place] [address] [phone]        Follow up with [facility].   Why:  Home Health RN, Physical Therapy, Occupational Therapy, and aide   Contact information:   [address] [place] [address] [phone]        The results of significant diagnostics from this hospitalization (including imaging, microbiology, ancillary and laboratory) are listed below for reference.    Significant Diagnostic Studies: Dg Ribs Unilateral W/chest Left  05/15/2014   CLINICAL DATA:  80 year old female fall 1 day ago with left posterior rib pain. Initial encounter.  EXAM: LEFT RIBS AND CHEST - 3+ VIEW  COMPARISON:  12/04/2013 and earlier.  FINDINGS: Supine AP view of the chest and multiple supine left rib obliques. Larger lung volumes. Stable cardiomegaly and mediastinal contours. Thoracic scoliosis and tortuous thoracic aorta with calcified plaque. No pneumothorax or pleural effusion identified. Chronic retrocardiac hypoventilation.  Bone mineralization is within normal limits for age. Rib marker placed at the left lateral tenth or eleventh rib level. No acute displaced rib fracture identified. The T12 vertebral body appears compressed (image 3) in this seems to be new since 11/09/2013.  IMPRESSION: 1. No acute displaced rib fracture identified. However, there is evidence of a T12 compression fracture which seems to be new since 12/06/2013. If specific therapy such as vertebroplasty is desired, lumbar MRI or whole-body bone scan would best determine acuity. 2. Cardiomegaly.  No acute cardiopulmonary abnormality.   Electronically Signed   By: Lars Pinks M.D.   On: 05/15/2014 11:14   Dg Thoracic Spine 2 View  05/15/2014   CLINICAL DATA:  Status post fall 1 day ago now with left sided upper and lower back pain  EXAM: THORACIC SPINE - 2 VIEW  COMPARISON:  Right rib series of today's date.  FINDINGS: There is reverse S-shaped thoracic scoliosis. There is mild loss of height of the body at T3 and of T12 there are no abnormal  paravertebral soft tissue densities. On the lateral film airspace opacities obscure the mid upper thoracic spine. A metallic BB projects anterior to T12 on the lateral film.  IMPRESSION: There is mild loss of height of the bodies of T3 and T12 the T12 compression is new since a CT scan of December 31, 2012. The T3 compression is of uncertain age.  Further workup with MRI or nuclear bone scanning as previously recommended is available upon request.   Electronically Signed   By: David  Martinique   On: 05/15/2014 13:26   Dg Lumbar Spine Complete  05/15/2014   CLINICAL DATA:  Status post fall 1 day ago now with upper and lower back pain, left-sided  EXAM: LUMBAR SPINE - COMPLETE 4+ VIEW  COMPARISON:  Sagittal reconstructed images of  the lumbar spine from an abdominal pelvic CT scan of December 31, 2012.  FINDINGS: The lumbar vertebral bodies are preserved in height. There is partial compression of the body of T12. The lumbar intervertebral disc space heights are reasonably well maintained. There is calcification of the L5-S1 disc. There is no spondylolisthesis. There is facet joint hypertrophy at L4-5 and at L5-S1. The observed portions of the sacrum are normal.  IMPRESSION: There is no acute bony abnormality of the lumbar spine.   Electronically Signed   By: David  Martinique   On: 05/15/2014 13:28   Dg Hip Complete Left  05/15/2014   CLINICAL DATA:  Fall 1 day ago.  EXAM: LEFT HIP - COMPLETE 2+ VIEW  COMPARISON:  None.  FINDINGS: There is no evidence of hip fracture or dislocation. There is no evidence of arthropathy or other focal bone abnormality.  IMPRESSION: Negative.   Electronically Signed   By: Franchot Gallo M.D.   On: 05/15/2014 11:13   Ct Head Wo Contrast  05/15/2014   CLINICAL DATA:  Left flank pain after a fall yesterday. Patient tripped and fell. Denies loss of consciousness. Second fall this week.  EXAM: CT HEAD WITHOUT CONTRAST  TECHNIQUE: Contiguous axial images were obtained from the base of the skull  through the vertex without intravenous contrast.  COMPARISON:  None.  FINDINGS: There is moderate central and cortical atrophy. Periventricular white matter changes are consistent with small vessel disease. There is no evidence for hemorrhage, mass lesion, or acute infarction. Bone windows are unremarkable.  IMPRESSION: 1. Atrophy and small vessel disease. 2.  No evidence for acute intracranial abnormality.   Electronically Signed   By: Shon Hale M.D.   On: 05/15/2014 10:49   Dg Chest Port 1 View  05/18/2014   CLINICAL DATA:  Dyspnea.  EXAM: PORTABLE CHEST - 1 VIEW  COMPARISON:  05/15/2014.  FINDINGS: Trachea is midline. Heart is mildly enlarged. Biapical pleural parenchymal scarring. Mild interstitial prominence is seen diffusely. Linear scarring or subsegmental atelectasis at the right lung base. No definite pleural fluid.  IMPRESSION: Mild interstitial prominence may be technical in etiology. Difficult to exclude mild edema.   Electronically Signed   By: Lorin Picket M.D.   On: 05/18/2014 08:14    Microbiology: No results found for this or any previous visit (from the past 240 hour(s)).   Labs: Basic Metabolic Panel:  Recent Labs Lab 05/15/14 1122 05/16/14 0514 05/17/14 1446 05/19/14 0426  NA 134* 136* 136* 133*  K 4.2 3.6* 4.0 4.3  CL 94* 96 96 96  CO2 _0 GLUCOSE 112* 121* 125* 114*  BUN 23 22 26* 24*  CREATININE 1.23* 1.25* 1.45* 1.40*  CALCIUM 9.4 9.5 9.1 9.3   Liver Function Tests:  Recent Labs Lab 05/17/14 1446  AST 31  ALT 21  ALKPHOS 68  BILITOT 0.6  PROT 6.4  ALBUMIN 2.9*   No results for input(s): LIPASE, AMYLASE in the last 168 hours. No results for input(s): AMMONIA in the last 168 hours. CBC:  Recent Labs Lab 05/15/14 1122 05/17/14 1446 05/19/14 0426  WBC 10.0 7.3 7.5  NEUTROABS 7.9* 5.2  --   HGB 13.2 12.7 12.6  HCT 39.4 38.5 37.1  MCV 92.7 93.7 92.3  PLT 234 231 238   Cardiac Enzymes:  Recent Labs Lab 05/17/14 1446  05/17/14 2040 05/18/14 0205  TROPONINI <0.30 <0.30 <0.30   BNP: BNP (last 3 results)  Recent Labs  12/04/13 1428  PROBNP  10429.0*   CBG:  Recent Labs Lab 05/18/14 0745 05/18/14 1143 05/18/14 1644 05/18/14 2227 05/19/14 0741  GLUCAP 108* 106* 139* 151* 117*    Time coordinating discharge: 45 minutes  Signed:  SHORT, MACKENZIE  [facility] 05/19/2014, 10:19 AM  Addendum: Patient medically cleared for discharge. Family is setting up supervision at home. Pt refuses SNF.

## 2014-05-20 DIAGNOSIS — M4854XS Collapsed vertebra, not elsewhere classified, thoracic region, sequela of fracture: Secondary | ICD-10-CM

## 2014-05-20 DIAGNOSIS — I714 Abdominal aortic aneurysm, without rupture: Secondary | ICD-10-CM

## 2014-05-20 DIAGNOSIS — J9601 Acute respiratory failure with hypoxia: Secondary | ICD-10-CM

## 2014-05-20 LAB — BASIC METABOLIC PANEL
ANION GAP: 11 (ref 5–15)
Anion gap: 15 (ref 5–15)
BUN: 23 mg/dL (ref 6–23)
BUN: 22 mg/dL (ref 6–23)
CALCIUM: 9.9 mg/dL (ref 8.4–10.5)
CALCIUM: 9.5 mg/dL (ref 8.4–10.5)
CO2: 29 meq/L (ref 19–32)
CO2: 25 mEq/L (ref 19–32)
Chloride: 102 mEq/L (ref 96–112)
Chloride: 97 mEq/L (ref 96–112)
Creatinine, Ser: 1.47 mg/dL — ABNORMAL HIGH (ref 0.50–1.10)
Creatinine, Ser: 1.35 mg/dL — ABNORMAL HIGH (ref 0.50–1.10)
GFR calc Af Amer: 37 mL/min — ABNORMAL LOW (ref >90)
GFR calc Af Amer: 41 mL/min — ABNORMAL LOW (ref >90)
GFR, EST NON AFRICAN AMERICAN: 32 mL/min — AB (ref >90)
GFR, EST NON AFRICAN AMERICAN: 35 mL/min — AB (ref >90)
GLUCOSE: 108 mg/dL — AB (ref 70–99)
Glucose, Bld: 109 mg/dL — ABNORMAL HIGH (ref 70–99)
POTASSIUM: 5.8 meq/L — AB (ref 3.7–5.3)
Potassium: 4.1 mEq/L (ref 3.7–5.3)
SODIUM: 142 meq/L (ref 137–147)
SODIUM: 137 meq/L (ref 137–147)

## 2014-05-20 LAB — CBC
HCT: 39.1 % (ref 36.0–46.0)
Hemoglobin: 12.9 g/dL (ref 12.0–15.0)
MCH: 30.6 pg (ref 26.0–34.0)
MCHC: 33 g/dL (ref 30.0–36.0)
MCV: 92.7 fL (ref 78.0–100.0)
PLATELETS: 268 10*3/uL (ref 150–400)
RBC: 4.22 MIL/uL (ref 3.87–5.11)
RDW: 15.3 % (ref 11.5–15.5)
WBC: 7.5 10*3/uL (ref 4.0–10.5)

## 2014-05-20 LAB — GLUCOSE, CAPILLARY
Glucose-Capillary: 113 mg/dL — ABNORMAL HIGH (ref 70–99)
Glucose-Capillary: 111 mg/dL — ABNORMAL HIGH (ref 70–99)
Glucose-Capillary: 107 mg/dL — ABNORMAL HIGH (ref 70–99)

## 2014-05-20 LAB — PROTIME-INR
INR: 2.2 — ABNORMAL HIGH (ref 0.00–1.49)
Prothrombin Time: 24.7 seconds — ABNORMAL HIGH (ref 11.6–15.2)

## 2014-05-20 MED ORDER — WARFARIN SODIUM 2.5 MG PO TABS
2.5000 mg | ORAL_TABLET | Freq: Once | ORAL | Status: AC
  Administered 2014-05-20: 2.5 mg via ORAL
  Filled 2014-05-20: qty 1

## 2014-05-20 NOTE — Progress Notes (Signed)
Clinical Social Work Department BRIEF PSYCHOSOCIAL ASSESSMENT 05/20/2014  Patient:  Heather Stevenson, Heather Stevenson     Account Number:  [id number]     Admit date:  05/15/2014  Clinical Social Worker:  Renold Genta  Date/Time:  05/20/2014 03:12 PM  Referred by:  Physician  Date Referred:  05/20/2014 Referred for  SNF Placement   Other Referral:   Interview type:  Patient Other interview type:   and son-in-law, Billy via phone    PSYCHOSOCIAL DATA Living Status:  ALONE Admitted from facility:   Level of care:   Primary support name:  Wynelle Bourgeois (daughter) ph#: [phone] Primary support relationship to patient:  CHILD, ADULT Degree of support available:   good    CURRENT CONCERNS Current Concerns  Post-Acute Placement   Other Concerns:    SOCIAL WORK ASSESSMENT / PLAN CSW received consult for [facility] vs. SNF at discharge.   Assessment/plan status:  Information/Referral to [employer] Other assessment/ plan:   Information/referral to community resources:   CSW completed FL2 and faxed information out to [facility] - provided bed offers.    PATIENT'S/FAMILY'S RESPONSE TO PLAN OF CARE: Patient is now agreeable with SNF, spoke with son-in-law, Abe People via phone and accepted bed at [facility]. [employer] authorization obtained. Tammy @ [facility] aware.       Raynaldo Opitz, [facility] Clinical Social Worker cell #: [phone]

## 2014-05-20 NOTE — Progress Notes (Signed)
CARE MANAGEMENT NOTE 05/20/2014  Patient:  Heather Stevenson, Heather Stevenson   Account Number:  [id number]  Date Initiated:  05/16/2014  Documentation initiated by:  [facility]  Subjective/Objective Assessment:   T12 compression fracture     Action/Plan:   lives alone   Anticipated DC Date:  05/16/2014   Anticipated DC Plan:  [place]  CM consult      [facility] Choice  HOME HEALTH   Choice offered to / List presented to:  [name]        [place] arranged  HH-10 DISEASE MANAGEMENT  HH-3 OT  HH-4 NURSE'S AIDE  HH-1 RN      [place].   Status of service:  Completed, signed off Medicare Important Message given?  YES (If response is "NO", the following Medicare IM given date fields will be blank) Date Medicare IM given:  05/19/2014 Medicare IM given by:  Karl Bales Date Additional Medicare IM given:   Additional Medicare IM given by:    Discharge Disposition:  [place]  Per UR Regulation:  Reviewed for med. necessity/level of care/duration of stay  If discussed at [place] of Stay Meetings, dates discussed:   05/19/2014    Comments:  05/20/14 MMcGibboney, RN, BSN Spoke with pt concerning discharge plans and SNF for rehab. Pt decline SNF for rehab.  Pt is alert and oreinted. Pt states that her daughter is traveling up from [facility] to stay with her. Pt gave a telephone number for daughter A call to her daughter Heather Stevenson [phone], was made with no answer. Pt asked that I call her son in law at [facility], who confirmed that his wife Heather Stevenson would pick pt up today after she is off work around 6 PM. I expressed what the pt had said to me on yesterday to [place] who states, yes she would say that.   Abe People also states that his wife came yesterday, spoke with MD, that this was not a safe discharge for pt.   05/19/14 MMcGibboney, RN, BSN Chart reviewed. Spoke with pt concerning discharge to  home. Pt states that, someone will be with her at home. Stating her neighbors would help her.  05/16/2014  4:32 PM  Notified [facility] of HH PT/OT and aide needed for dc home today. Pt states she has RW at home. Requested [facility] for [facility]. Provided pt and grand-daughter with list of private duty agencies that service [facility] Jonnie Finner RN CCM Case Mgmt phone [phone]

## 2014-05-20 NOTE — Progress Notes (Signed)
Physical Therapy Treatment Patient Details Name: Heather Stevenson MRN: [id number] DOB: Aug 03, 1929 Today's Date: 05/20/2014    History of Present Illness 80 year old obese female with history of COPD, active smoker, A. Fib on Coumadin, history of CHF, history of AAA of 5.5 cm (refused surgery),hypertension, hyperlipidemia, gout, admitted 05/15/14 after a mechanical fall with onset of back pain.   Thoracic xray shows T12 compression fx and pt refused to finish MRI per progress notes.    PT Comments    Pt requires min-mod VCs for back precaution adherence, techniques for mobility, and motivation/redirection to perform and complete tasks.  Pt remains limited due to weakness and fatigue.  Pt was completed gait training of 45 feet x2 with one sitting rest break; vitals WNL.  Recommend HH PT.  Follow Up Recommendations  Supervision for mobility/OOB;SNF     Equipment Recommendations  Rolling walker with 5" wheels    Recommendations for Other Services  [facility] PT)     Precautions / Restrictions Precautions Precautions: Back Required Braces or Orthoses: Other Brace/Splint Other Brace/Splint: has 3 point brace already on upon arrival Restrictions Weight Bearing Restrictions: No    Mobility  Bed Mobility Overal bed mobility: Needs Assistance Bed Mobility: Sidelying to Sit;Sit to Sidelying;Rolling Rolling: Supervision Sidelying to sit: Supervision     Sit to sidelying: Supervision General bed mobility comments: VCs for log rolling technique; pt was able to perform with supervision, but had poor recall of technique from last tx.  Transfers Overall transfer level: Needs assistance Equipment used: Rolling walker (2 wheeled) Transfers: Sit to/from Stand (x2) Sit to Stand: Min assist         General transfer comment: VCs for foot/hand placement; pt requires increased time to perform and min redirecting  Ambulation/Gait Ambulation/Gait assistance: Min guard Ambulation Distance (Feet):  45 Feet (x2) Assistive device: Rolling walker (2 wheeled) Gait Pattern/deviations: Step-through pattern;Decreased stride length;Drifts right/left Gait velocity: decr   General Gait Details: VCs for RW management and posture; pt required one sitting rest break after 45 feet due to fatigue; pt requires min-mod redirecting for task completion   Stairs            Wheelchair Mobility    Modified Rankin (Stroke Patients Only)       Balance                                    Cognition Arousal/Alertness: Awake/alert Behavior During Therapy: WFL for tasks assessed/performed Overall Cognitive Status: Within Functional Limits for tasks assessed                      Exercises General Exercises - Lower Extremity Heel Slides: AROM;Both;10 reps;Supine    General Comments        Pertinent Vitals/Pain Pain Assessment: 0-10 Pain Score: 2  Pain Location: back Pain Intervention(s): Monitored during session;Repositioned    Home Living                      Prior Function            PT Goals (current goals can now be found in the care plan section) Progress towards PT goals: Progressing toward goals    Frequency  Min 3X/week    PT Plan Current plan remains appropriate    Co-evaluation             End of Session Equipment Utilized During  Treatment: Back brace;Gait belt Activity Tolerance: Patient tolerated treatment well Patient left: in bed;with call bell/phone within reach;with bed alarm set     Time: 0910-0939 PT Time Calculation (min) (ACUTE ONLY): 29 min  Charges:                       G Codes:      Miller,Derrick, SPTA 05/20/2014, 12:00 PM   Reviewed above  Rica Koyanagi  PTA [facility]  Acute  Rehab Pager      [phone]

## 2014-05-20 NOTE — Progress Notes (Signed)
ANTICOAGULATION CONSULT NOTE - Follow Up Consult  Pharmacy Consult for Warfarin Indication: atrial fibrillation  Allergies  Allergen Reactions  . Hctz [Hydrochlorothiazide]   . Sulfa Antibiotics   . Tetracyclines & Related     Patient Measurements: Height: 5' 5.25" (165.7 cm) Weight: 217 lb (98.431 kg) IBW/kg (Calculated) : 57.58  Vital Signs: Temp: 98.2 F (36.8 C) (11/11 0452) Temp Source: Oral (11/11 0452) BP: 128/77 mmHg (11/11 0452) Pulse Rate: 85 (11/11 0452)  Labs:  Recent Labs  05/17/14 1446 05/17/14 2040 05/18/14 0205 05/19/14 0426 05/20/14 0429 05/20/14 0630  HGB 12.7  --   --  12.6 12.9  --   HCT 38.5  --   --  37.1 39.1  --   PLT 231  --   --  238 268  --   LABPROT  --   --  39.2* 30.3* 24.7*  --   INR  --   --  3.99* 2.87* 2.20*  --   CREATININE 1.45*  --   --  1.40* 1.47* 1.35*  TROPONINI <0.30 <0.30 <0.30  --   --   --     Estimated Creatinine Clearance: 37.5 mL/min (by C-G formula based on Cr of 1.35).   Medications:  Scheduled:  . acetaminophen  1,000 mg Oral TID  . allopurinol  300 mg Oral Daily  . cholecalciferol  5,000 Units Oral Daily  . citalopram  20 mg Oral Daily  . diltiazem  180 mg Oral Daily  . docusate sodium  100 mg Oral BID  . fenofibrate  160 mg Oral Daily  . insulin aspart  0-15 Units Subcutaneous TID WC  . insulin aspart  0-5 Units Subcutaneous QHS  . magnesium oxide  200 mg Oral Daily  . metoprolol succinate  100 mg Oral Daily  . montelukast  10 mg Oral QHS  . multivitamin with minerals  1 tablet Oral Daily  . pantoprazole  40 mg Oral Daily  . potassium chloride  10 mEq Oral BID  . rosuvastatin  5 mg Oral QHS  . Warfarin - Pharmacist Dosing Inpatient   Does not apply q1800   Inpatient Warfarin Doses: 11/6: 2.144m 11/7: 2.57m11/8: none 11/9: none 11/10: none, dose not ordered inpatient due to discharge orders.    Assessment: [age]o F admitted 11/6, presenting with mid back and R flank pain x 1 day after a  mechanical fall. X-ray of the thoracic spine showed likely a new compression fracture of T12 and fracture of T3 of indeterminate duration.  Her PMH includes atrial fibrillation on chronic warfarin.  Home dose was warfarin 2.44m7maily except 44mg59m Wed.  INR on admission was therapeutic.  Pharmacy consulted to assist with Warfarin dosing for a-fib.  Today, 11/11:  INR 2.2, remains in therapeutic range  CBC: Hgb 12.9, Plt 268  Diet: heart healthy, carb modified.  Eating 25-100% of meals.  DDI: Allopurinol, Fenofibrate, Citalopram (home meds)   Goal of Therapy:  INR 2-3 Monitor platelets by anticoagulation protocol: Yes   Plan:   Warfarin 2.44mg 24monce at 1200  Daily INR and CBC while inpatient.  For discharge, would reduce to 2.44mg P16maily (avoid 44mg do67mon Wednesday)  ChristiGretta Arab, BCPS Pager [phone] 10:28 AM

## 2014-05-20 NOTE — Progress Notes (Signed)
Patient is set to discharge to [facility] today. Patient, daughter & son-in-law, Heather Stevenson aware & agreeable with plan. [employer] authorization obtained. Discharge packet given to RN, Heather Stevenson. Daughter, Heather Stevenson to transport to SNF when she gets off work ~6:00pm.   [place] NOTE 05/20/2014  Patient:  Heather Stevenson, Heather Stevenson  Account Number:  [id number] Admit date:  05/15/2014  Clinical Social Worker:  Renold Genta  Date/time:  05/20/2014 03:15 PM  Clinical Social Work is seeking post-discharge placement for this patient at the following level of care:   [place]   (*CSW will update this form in [place] as items are completed)   05/20/2014  Patient/family provided with [place] list of facilities offering this level of care within the geographic area requested by the patient (or if unable, by the patient's family).  05/20/2014  Patient/family informed of their freedom to choose among providers that offer the needed level of care, that participate in Medicare, Medicaid or managed care program needed by the patient, have an available bed and are willing to accept the patient.  05/20/2014  Patient/family informed of [facility]' ownership interest in [facility], as well as of the fact that they are under no obligation to receive care at this facility.  PASARR submitted to EDS on 05/20/2014 PASARR number received on 05/20/2014  FL2 transmitted to all facilities in geographic area requested by pt/family on  05/20/2014 FL2 transmitted to all facilities within larger geographic area on   Patient informed that his/her managed care company has contracts with or will negotiate with  certain facilities, including the following:     Patient/family informed of bed offers received:  05/20/2014 Patient chooses bed at [facility], [place] Physician  recommends and patient chooses bed at    Patient to be transferred to [facility] [place] on  05/20/2014 Patient to be transferred to facility by patient's daughter, Heather Stevenson's car Patient and family notified of transfer on 05/20/2014 Name of family member notified:  daughter, Heather Stevenson & son-in-law, Heather Stevenson via phone  The following physician request were entered in Epic:   Additional Comments:   Raynaldo Opitz, [place] Social Worker cell #: [phone]

## 2014-05-21 ENCOUNTER — Other Ambulatory Visit: Payer: Self-pay | Admitting: *Deleted

## 2014-05-21 MED ORDER — TRAMADOL HCL 50 MG PO TABS: ORAL_TABLET | ORAL | Status: DC

## 2014-05-21 NOTE — Telephone Encounter (Signed)
Heather Stevenson

## 2014-05-26 ENCOUNTER — Non-Acute Institutional Stay (SKILLED_NURSING_FACILITY): Payer: Medicare Other | Admitting: Internal Medicine

## 2014-05-26 ENCOUNTER — Ambulatory Visit: Payer: Self-pay | Admitting: Emergency Medicine

## 2014-05-26 ENCOUNTER — Encounter: Payer: Self-pay | Admitting: Internal Medicine

## 2014-05-26 DIAGNOSIS — I482 Chronic atrial fibrillation, unspecified: Secondary | ICD-10-CM | POA: Insufficient documentation

## 2014-05-26 DIAGNOSIS — S22080S Wedge compression fracture of T11-T12 vertebra, sequela: Secondary | ICD-10-CM

## 2014-05-26 DIAGNOSIS — J449 Chronic obstructive pulmonary disease, unspecified: Secondary | ICD-10-CM

## 2014-05-26 DIAGNOSIS — I714 Abdominal aortic aneurysm, without rupture, unspecified: Secondary | ICD-10-CM

## 2014-05-26 DIAGNOSIS — E785 Hyperlipidemia, unspecified: Secondary | ICD-10-CM

## 2014-05-26 DIAGNOSIS — I10 Essential (primary) hypertension: Secondary | ICD-10-CM

## 2014-05-26 DIAGNOSIS — M4854XS Collapsed vertebra, not elsewhere classified, thoracic region, sequela of fracture: Secondary | ICD-10-CM

## 2014-05-26 DIAGNOSIS — K219 Gastro-esophageal reflux disease without esophagitis: Secondary | ICD-10-CM | POA: Insufficient documentation

## 2014-05-26 DIAGNOSIS — I5022 Chronic systolic (congestive) heart failure: Secondary | ICD-10-CM

## 2014-05-26 DIAGNOSIS — N189 Chronic kidney disease, unspecified: Secondary | ICD-10-CM

## 2014-05-26 DIAGNOSIS — R55 Syncope and collapse: Secondary | ICD-10-CM

## 2014-05-26 DIAGNOSIS — E1122 Type 2 diabetes mellitus with diabetic chronic kidney disease: Secondary | ICD-10-CM

## 2014-05-26 NOTE — Assessment & Plan Note (Signed)
Continued Cardizem, statin, metoprolol and Lasix

## 2014-05-26 NOTE — Assessment & Plan Note (Signed)
infrarenal abdominal aortic aneurysm of 5.4 cm as per CT scan in 2014. As per PCP notes she has refused for surgery. Follows with Dr. Kellie Simmering

## 2014-05-26 NOTE — Assessment & Plan Note (Signed)
had some initial acute hypoxic respiratory failure due to splinting and pain medication which quickly resolved. Continued Singulair, albuterol. Counseled smoking cessation. Refused nicotine patch.

## 2014-05-26 NOTE — Assessment & Plan Note (Signed)
Last A1c of 7.2. Not on any medications. Will place on sliding scale insulin and monitor.

## 2014-05-26 NOTE — Assessment & Plan Note (Signed)
rate controlled and INR therapeutic. Continued metoprolol, Cardizem, and Coumadin

## 2014-05-26 NOTE — Assessment & Plan Note (Signed)
Continue crestor

## 2014-05-26 NOTE — Assessment & Plan Note (Signed)
with mild loss of height of T3 and T12 with new compression fracture of T12 since June of 2014. She had no compromise of bowels or bladder and no lower extremity symptoms. She was admitted under observation for pain control. Occupational therapy provided a thoracolumbar corset and physical therapy evaluated her and she was able to walk with some assistance to the nursing station. She has been set up for home health PT/OT/RN and aid. The family is unable to stay with her for the next few days and are planning to hire an aid to stay with her at her independent living facility. Vitamin D level 68 a few months ago.

## 2014-05-26 NOTE — Assessment & Plan Note (Signed)
EF 40-45%, appeared dry and is orthostatic. Lasix were held and she was hydrated. Recommend holding lasix and if she gains more than 3-lbs in one day or 5-lbs in 1 week, resume lasix and call primary care doctor's office.

## 2014-05-26 NOTE — Progress Notes (Signed)
MRN: [id number] Name: Heather Stevenson  Sex: female Age: 80 y.o. DOB: 1929-11-04  Morse #: [name] Facility/Room: 122A Level Of Care: SNF Provider: Inocencio Homes D Emergency Contacts: Extended Emergency Contact Information Primary Emergency Contact: Livingston,Joni Address: [place], [place] Phone: [phone] Mobile Phone: [phone] Relation: Daughter  Code Status: DNR  Allergies: Hctz; Sulfa antibiotics; and Tetracyclines & related  Chief Complaint  Patient presents with  . New Admit To SNF    HPI: Patient is 80 y.o. female who suffered a mechanical fall with T12 compression fx admitted for OT/PT.  Past Medical History  Diagnosis Date  . Hyperlipidemia   . Gout   . History of small bowel obstruction   . COPD (chronic obstructive pulmonary disease)   . Substance abuse     Tobacco  . Depression   . Pulmonary hypertension   . Umbilical hernia   . Bronchitis   . Atrial fibrillation   . High cholesterol   . Pneumonia   . Arthritis   . Abdominal aortic aneurysm     followed by Dr Kellie Simmering- 4.8 cm per [place] 6/12- EPIC  . Complication of anesthesia     slow to wake up per pt- anesthesia record on chart  . Hypertension   . Anxiety   . Atherosclerosis of aorta   . Systolic heart failure     EF 40-45%     Past Surgical History  Procedure Laterality Date  . Small intestine surgery  05/2010  . Abdominal hysterectomy      Vaginal  . Hernia repair  05/2010    incarcerated Umbilical hernia  . Tonsillectomy    . Incisional hernia repair  02/21/2012    Procedure: LAPAROSCOPIC INCISIONAL HERNIA;  Surgeon: Harl Bowie, MD;  Location: [facility];  Service: General;  Laterality: N/A;      Medication List       This list is accurate as of: 05/26/14  7:17 PM.  Always use your most recent med list.               acetaminophen 500 MG tablet  Commonly known as:  TYLENOL  Take 500 mg by mouth every 6 (six)  hours as needed for moderate pain.     allopurinol 300 MG tablet  Commonly known as:  ZYLOPRIM  TAKE 1 TABLET ONCE DAILY. FOR GOUT     CENTRUM tablet  Take 1 tablet by mouth daily.     citalopram 20 MG tablet  Commonly known as:  CELEXA  Take 1 tablet (20 mg total) by mouth daily.     [employer] VIT D 5000 HIGH-POTENCY PO  Take 5,000 Units by mouth daily.     dexlansoprazole 60 MG capsule  Commonly known as:  DEXILANT  Take 1 capsule (60 mg total) by mouth daily.     diltiazem 240 MG 24 hr capsule  Commonly known as:  CARDIZEM CD  Take 1 capsule (240 mg total) by mouth daily.     DSS 100 MG Caps  Take 100 mg by mouth 2 (two) times daily.     fenofibrate micronized 134 MG capsule  Commonly known as:  LOFIBRA  TAKE 1 CAPSULE ONCE DAILY FOR BLOOD FATS.     Magnesium 250 MG Tabs  Take 250 mg by mouth daily.     metoprolol succinate 100 MG 24 hr tablet  Commonly known as:  TOPROL-XL  Take 1 tablet (100 mg total) by mouth daily. Take with or immediately following a meal.     montelukast 10 MG tablet  Commonly known as:  SINGULAIR  Take 1 tablet (10 mg total) by mouth at bedtime.     potassium chloride 10 MEQ tablet  Commonly known as:  K-DUR  Take 1 tablet (10 mEq total) by mouth 2 (two) times daily.     rosuvastatin 5 MG tablet  Commonly known as:  CRESTOR  Take 5 mg by mouth at bedtime.     traMADol 50 MG tablet  Commonly known as:  ULTRAM  Take one tablet by mouth every 6 hours as needed for pain     warfarin 5 MG tablet  Commonly known as:  COUMADIN  Take 5 mg by mouth daily. Takes 1/2 tab 6 days a week and 1 tab on Wed        No orders of the defined types were placed in this encounter.    Immunization History  Administered Date(s) Administered  . DTaP 07/11/2003  . Influenza Whole 05/06/2013  . Influenza, High Dose Seasonal PF 05/01/2014  . Pneumococcal Conjugate-13 05/01/2014  . Pneumococcal Polysaccharide-23 11/27/2012    History  Substance Use  Topics  . Smoking status: Current Every Day Smoker -- 0.50 packs/day for 50 years    Types: Cigarettes  . Smokeless tobacco: Never Used  . Alcohol Use: No    Family history is noncontributory    Review of Systems  DATA OBTAINED: from patient,  family member GENERAL:  no fevers, fatigue, appetite changes SKIN: No itching, rash or wounds EYES: No eye pain, redness, discharge EARS: No earache, tinnitus, change in hearing NOSE: No congestion, drainage or bleeding  MOUTH/THROAT: No mouth or tooth pain, No sore throat RESPIRATORY: No cough, wheezing, SOB CARDIAC: No chest pain, palpitations, lower extremity edema  GI: No abdominal pain, No N/V/D or constipation, No heartburn or reflux  GU: No dysuria, frequency or urgency, or incontinence  MUSCULOSKELETAL: No unrelieved bone/joint pain NEUROLOGIC: No headache, dizziness or focal weakness PSYCHIATRIC: No overt anxiety or sadness, No behavior issue.   Filed Vitals:   05/26/14 1812  BP: 130/70  Pulse: 60  Temp: 98.6 F (37 C)  Resp: 20    Physical Exam  GENERAL APPEARANCE: Alert, conversant,  No acute distress.  SKIN: No diaphoresis rash HEAD: Normocephalic, atraumatic  EYES: Conjunctiva/lids clear. Pupils round, reactive. EOMs intact.  EARS: External exam WNL, canals clear. Hearing grossly normal.  NOSE: No deformity or discharge.  MOUTH/THROAT: Lips w/o lesions  RESPIRATORY: Breathing is even, unlabored. Lung sounds are clear   CARDIOVASCULAR: Heart RRR no murmurs, rubs or gallops. No peripheral edema.   GASTROINTESTINAL: Abdomen is soft, non-tender, not distended w/ normal bowel sounds. GENITOURINARY: Bladder non tender, not distended  MUSCULOSKELETAL: No abnormal joints or musculature NEUROLOGIC:  Cranial nerves 2-12 grossly intact. Moves all extremities  PSYCHIATRIC: Mood and affect appropriate to situation, no behavioral issues  Patient Active Problem List   Diagnosis Date Noted  . Syncope, near 05/26/2014  .  Chronic systolic CHF (congestive heart failure) 05/26/2014  . Atrial fibrillation, chronic 05/26/2014  . GERD (gastroesophageal reflux disease) 05/26/2014  . Orthostatic hypotension 05/17/2014  . T12 compression fracture 05/15/2014  . Back pain 05/15/2014  . Controlled atrial fibrillation 05/15/2014  . Tobacco abuse 05/15/2014  . CKD (chronic kidney disease), stage IV 05/15/2014  . AAA (abdominal aortic aneurysm) 05/15/2014  . Thoracic compression fracture 05/15/2014  . Acute respiratory  failure with hypoxia 12/05/2013  . Atrial fibrillation with RVR 12/04/2013  . ASHD w/Diastolic Heart Failure 93/81/0175  . CKD (chronic kidney disease) 12/04/2013  . T2_NIDDM w/Stage 4 CKD (GFR 28 ml/min) 10/02/2013  . Vitamin D deficiency 10/02/2013  . Medication management 10/02/2013  . Obesity (BMI 38.76) 10/02/2013  . Hypertension   . Hyperlipidemia   . Abdominal aortic aneurysm   . COPD (chronic obstructive pulmonary disease)   . History of small bowel obstruction   . Incisional hernia 02/01/2012    CBC    Component Value Date/Time   WBC 7.5 05/20/2014 0429   RBC 4.22 05/20/2014 0429   HGB 12.9 05/20/2014 0429   HCT 39.1 05/20/2014 0429   PLT 268 05/20/2014 0429   MCV 92.7 05/20/2014 0429   LYMPHSABS 1.5 05/17/2014 1446   MONOABS 0.6 05/17/2014 1446   EOSABS 0.1 05/17/2014 1446   BASOSABS 0.0 05/17/2014 1446    CMP     Component Value Date/Time   NA 137 05/20/2014 0630   K 4.1 05/20/2014 0630   CL 97 05/20/2014 0630   CO2 25 05/20/2014 0630   GLUCOSE 108* 05/20/2014 0630   BUN 22 05/20/2014 0630   CREATININE 1.35* 05/20/2014 0630   CREATININE 1.52* 05/01/2014 1040   CALCIUM 9.5 05/20/2014 0630   PROT 6.4 05/17/2014 1446   ALBUMIN 2.9* 05/17/2014 1446   AST 31 05/17/2014 1446   ALT 21 05/17/2014 1446   ALKPHOS 68 05/17/2014 1446   BILITOT 0.6 05/17/2014 1446   GFRNONAA 35* 05/20/2014 0630   GFRNONAA 32* 05/01/2014 1040   GFRAA 41* 05/20/2014 0630   GFRAA 37*  05/01/2014 1040    Assessment and Plan  T12 compression fracture with mild loss of height of T3 and T12 with new compression fracture of T12 since June of 2014. She had no compromise of bowels or bladder and no lower extremity symptoms. She was admitted under observation for pain control. Occupational therapy provided a thoracolumbar corset and physical therapy evaluated her and she was able to walk with some assistance to the nursing station. She has been set up for home health PT/OT/RN and aid. The family is unable to stay with her for the next few days and are planning to hire an aid to stay with her at her independent living facility. Vitamin D level 68 a few months ago.   Syncope, near likely secondary to pain medication and mild dehydration. Orthostatic vital signs were positive. Her lasix were held and she was given IVF. Her pain medication was changed to ultram which she did not require frequent doses of. She improved and on the day of discharge, was able to walk with her walker to the bathroom without orthostasis or difficulty. Recommend TED hose while up during the day  Hypertension Continued Cardizem, statin, metoprolol and Lasix  Chronic systolic CHF (congestive heart failure) EF 40-45%, appeared dry and is orthostatic. Lasix were held and she was hydrated. Recommend holding lasix and if she gains more than 3-lbs in one day or 5-lbs in 1 week, resume lasix and call primary care doctor's office.    Atrial fibrillation, chronic rate controlled and INR therapeutic. Continued metoprolol, Cardizem, and Coumadin   COPD (chronic obstructive pulmonary disease) had some initial acute hypoxic respiratory failure due to splinting and pain medication which quickly resolved. Continued Singulair, albuterol. Counseled smoking cessation. Refused nicotine patch.   T2_NIDDM w/Stage 4 CKD (GFR 28 ml/min) Last A1c of 7.2. Not on any medications. Will place on sliding  scale  insulin and monitor.    AAA (abdominal aortic aneurysm) infrarenal abdominal aortic aneurysm of 5.4 cm as per CT scan in 2014. As per PCP notes she has refused for surgery. Follows with Dr. Kellie Simmering  Hyperlipidemia Continue crestor  GERD (gastroesophageal reflux disease) Continue dexilant    Hennie Duos, MD

## 2014-05-26 NOTE — Assessment & Plan Note (Signed)
likely secondary to pain medication and mild dehydration. Orthostatic vital signs were positive. Her lasix were held and she was given IVF. Her pain medication was changed to ultram which she did not require frequent doses of. She improved and on the day of discharge, was able to walk with her walker to the bathroom without orthostasis or difficulty. Recommend TED hose while up during the day

## 2014-05-26 NOTE — Assessment & Plan Note (Signed)
Continue dexilant

## 2014-06-01 ENCOUNTER — Ambulatory Visit: Payer: Self-pay | Admitting: Emergency Medicine

## 2014-06-09 ENCOUNTER — Other Ambulatory Visit: Payer: Self-pay | Admitting: Internal Medicine

## 2014-06-09 DIAGNOSIS — R296 Repeated falls: Secondary | ICD-10-CM

## 2014-06-11 ENCOUNTER — Non-Acute Institutional Stay (SKILLED_NURSING_FACILITY): Payer: Medicare Other | Admitting: Adult Health

## 2014-06-11 DIAGNOSIS — I5022 Chronic systolic (congestive) heart failure: Secondary | ICD-10-CM

## 2014-06-11 DIAGNOSIS — K219 Gastro-esophageal reflux disease without esophagitis: Secondary | ICD-10-CM

## 2014-06-11 DIAGNOSIS — I482 Chronic atrial fibrillation, unspecified: Secondary | ICD-10-CM

## 2014-06-11 DIAGNOSIS — S22080S Wedge compression fracture of T11-T12 vertebra, sequela: Secondary | ICD-10-CM

## 2014-06-11 DIAGNOSIS — M4854XS Collapsed vertebra, not elsewhere classified, thoracic region, sequela of fracture: Secondary | ICD-10-CM

## 2014-06-13 ENCOUNTER — Encounter: Payer: Self-pay | Admitting: Adult Health

## 2014-06-13 NOTE — Progress Notes (Signed)
Patient ID: Heather Stevenson, female   DOB: 1930-06-19, 80 y.o.   MRN: [id number]  [facility]     Allergies  Allergen Reactions  . Hctz [Hydrochlorothiazide]   . Sulfa Antibiotics   . Tetracyclines & Related        Chief Complaint  Patient presents with  . Discharge Note    HPI:  She is being discharged to home with home health for pt/ot/rn/aid. She will need a wheelchair; 3:1 commode; tub bench; reacher and sock aid.  She will need an inr on 06-15-14. She will need her prescriptions to be written and will need a follow up with her pcp. She had been hospitalized following a mechanical fall at home in which she suffered a T12 compression fracture. She was admitted to this facility for short term rehab.    Past Medical History  Diagnosis Date  . Hyperlipidemia   . Gout   . History of small bowel obstruction   . COPD (chronic obstructive pulmonary disease)   . Substance abuse     Tobacco  . Depression   . Pulmonary hypertension   . Umbilical hernia   . Bronchitis   . Atrial fibrillation   . High cholesterol   . Pneumonia   . Arthritis   . Abdominal aortic aneurysm     followed by Dr Kellie Simmering- 4.8 cm per [place] 6/12- EPIC  . Complication of anesthesia     slow to wake up per pt- anesthesia record on chart  . Hypertension   . Anxiety   . Atherosclerosis of aorta   . Systolic heart failure     EF 40-45%     Past Surgical History  Procedure Laterality Date  . Small intestine surgery  05/2010  . Abdominal hysterectomy      Vaginal  . Hernia repair  05/2010    incarcerated Umbilical hernia  . Tonsillectomy    . Incisional hernia repair  02/21/2012    Procedure: LAPAROSCOPIC INCISIONAL HERNIA;  Surgeon: Harl Bowie, MD;  Location: [facility];  Service: General;  Laterality: N/A;    VITAL SIGNS BP 114/70 mmHg  Pulse 66  Ht _0  (1.651 m)  Wt 212 lb (96.163 kg)  BMI 35.28 kg/m2   Outpatient Encounter Prescriptions as of 06/11/2014  Medication Sig  .  acetaminophen (TYLENOL) 500 MG tablet Take 500 mg by mouth every 6 (six) hours as needed for moderate pain.  Marland Kitchen allopurinol (ZYLOPRIM) 300 MG tablet TAKE 1 TABLET ONCE DAILY. FOR GOUT  . atorvastatin (LIPITOR) 20 MG tablet Take 20 mg by mouth daily.  . Cholecalciferol ([employer]S VIT D 5000 HIGH-POTENCY PO) Take 5,000 Units by mouth daily.   . citalopram (CELEXA) 20 MG tablet Take 1 tablet (20 mg total) by mouth daily.  Marland Kitchen dexlansoprazole (DEXILANT) 60 MG capsule Take 1 capsule (60 mg total) by mouth daily.  Marland Kitchen diltiazem (CARDIZEM CD) 240 MG 24 hr capsule Take 1 capsule (240 mg total) by mouth daily.  Marland Kitchen docusate sodium 100 MG CAPS Take 100 mg by mouth 2 (two) times daily.  . fenofibrate micronized (LOFIBRA) 134 MG capsule TAKE 1 CAPSULE ONCE DAILY FOR BLOOD FATS.  . Magnesium 250 MG TABS Take 250 mg by mouth daily.  . metoprolol succinate (TOPROL-XL) 100 MG 24 hr tablet Take 1 tablet (100 mg total) by mouth daily. Take with or immediately following a meal.  . montelukast (SINGULAIR) 10 MG tablet Take 1 tablet (10 mg total) by mouth at bedtime.  . Multiple  Vitamins-Minerals (CENTRUM) tablet Take 1 tablet by mouth daily.   . potassium chloride (K-DUR) 10 MEQ tablet Take 1 tablet (10 mEq total) by mouth 2 (two) times daily.  . traMADol (ULTRAM) 50 MG tablet Take one tablet by mouth every 6 hours as needed for pain  . warfarin (COUMADIN) 5 MG tablet Take 5 mg by mouth daily. Takes 1/2 tab 6 days a week and 1 tab on Wed  . [DISCONTINUED] rosuvastatin (CRESTOR) 5 MG tablet Take 5 mg by mouth at bedtime.      SIGNIFICANT DIAGNOSTIC EXAMS  LABS REVIEWED:  06-02-14:wbc 7.1; hgb 13.5; hct 45.3; mcv 98.4; plt 272 06-04-14: wbc 112; bun 19; creat 1.43; k+4.3; na++137     Review of Systems  Constitutional: Negative for malaise/fatigue.  Respiratory: Negative for cough and shortness of breath.   Cardiovascular: Negative for chest pain, palpitations and leg swelling.  Gastrointestinal: Negative for  heartburn, abdominal pain and blood in stool.  Musculoskeletal: Negative for myalgias and joint pain.  Skin: Negative.   Psychiatric/Behavioral: Negative for depression. The patient is not nervous/anxious.      Physical Exam  Constitutional: She is oriented to person, place, and time. She appears well-developed and well-nourished. No distress.  Obese   Neck: Neck supple. No JVD present.  Cardiovascular: Normal rate, regular rhythm and intact distal pulses.   Respiratory: Effort normal and breath sounds normal. No respiratory distress. She has no wheezes.  GI: Soft. Bowel sounds are normal. She exhibits no distension. There is no tenderness.  Musculoskeletal: She exhibits no edema.  Is able to move all extremities   Neurological: She is alert and oriented to person, place, and time.  Skin: Skin is warm and dry. She is not diaphoretic.     ASSESSMENT/ PLAN:   Will discharge her to home with home health for pt/ot/rn/aid: to increase strength; independence with adl's; inr monitoring;  She will need an inr on 06-15-14; and adl care. She will need a a wheelchair in order for her to maintain her current level of independence in the community setting she will need a 3: commode; tub bench; reacher and sock aid. Her prescriptions have been written for 30 day supply of her medications with #45 ultram 50 mg tabs. She has a follow up appointment scheduled with her pcp Dr. Unk Pinto on 06-15-14 at 4 pm.    Time spent with patient 45 minutes.   Ok Edwards NP [facility]  Contact [phone] Monday through Friday 8am- 5pm  After hours call [phone]

## 2014-06-14 DIAGNOSIS — I10 Essential (primary) hypertension: Secondary | ICD-10-CM

## 2014-06-14 DIAGNOSIS — I480 Paroxysmal atrial fibrillation: Secondary | ICD-10-CM

## 2014-06-14 DIAGNOSIS — J449 Chronic obstructive pulmonary disease, unspecified: Secondary | ICD-10-CM

## 2014-06-15 ENCOUNTER — Encounter: Payer: Self-pay | Admitting: Physician Assistant

## 2014-06-15 ENCOUNTER — Ambulatory Visit (INDEPENDENT_AMBULATORY_CARE_PROVIDER_SITE_OTHER): Payer: Medicare Other | Admitting: Physician Assistant

## 2014-06-15 VITALS — HR 100 | Temp 98.1°F | Resp 16 | Wt 202.0 lb

## 2014-06-15 DIAGNOSIS — J449 Chronic obstructive pulmonary disease, unspecified: Secondary | ICD-10-CM

## 2014-06-15 DIAGNOSIS — E669 Obesity, unspecified: Secondary | ICD-10-CM

## 2014-06-15 DIAGNOSIS — Z720 Tobacco use: Secondary | ICD-10-CM

## 2014-06-15 DIAGNOSIS — I5022 Chronic systolic (congestive) heart failure: Secondary | ICD-10-CM

## 2014-06-15 DIAGNOSIS — I10 Essential (primary) hypertension: Secondary | ICD-10-CM

## 2014-06-15 DIAGNOSIS — S22080S Wedge compression fracture of T11-T12 vertebra, sequela: Secondary | ICD-10-CM

## 2014-06-15 DIAGNOSIS — E559 Vitamin D deficiency, unspecified: Secondary | ICD-10-CM

## 2014-06-15 DIAGNOSIS — Z9181 History of falling: Secondary | ICD-10-CM

## 2014-06-15 DIAGNOSIS — I4891 Unspecified atrial fibrillation: Secondary | ICD-10-CM

## 2014-06-15 DIAGNOSIS — N184 Chronic kidney disease, stage 4 (severe): Secondary | ICD-10-CM

## 2014-06-15 DIAGNOSIS — K219 Gastro-esophageal reflux disease without esophagitis: Secondary | ICD-10-CM

## 2014-06-15 DIAGNOSIS — Z79899 Other long term (current) drug therapy: Secondary | ICD-10-CM

## 2014-06-15 DIAGNOSIS — E1122 Type 2 diabetes mellitus with diabetic chronic kidney disease: Secondary | ICD-10-CM

## 2014-06-15 DIAGNOSIS — E785 Hyperlipidemia, unspecified: Secondary | ICD-10-CM

## 2014-06-15 LAB — CBC WITH DIFFERENTIAL/PLATELET
Basophils Absolute: 0 10*3/uL (ref 0.0–0.1)
Basophils Relative: 0 % (ref 0–1)
EOS ABS: 0.1 10*3/uL (ref 0.0–0.7)
Eosinophils Relative: 1 % (ref 0–5)
HEMATOCRIT: 42.6 % (ref 36.0–46.0)
HEMOGLOBIN: 14.1 g/dL (ref 12.0–15.0)
LYMPHS ABS: 1.8 10*3/uL (ref 0.7–4.0)
LYMPHS PCT: 22 % (ref 12–46)
MCH: 30.7 pg (ref 26.0–34.0)
MCHC: 33.1 g/dL (ref 30.0–36.0)
MCV: 92.6 fL (ref 78.0–100.0)
MONOS PCT: 8 % (ref 3–12)
MPV: 9.7 fL (ref 9.4–12.4)
Monocytes Absolute: 0.6 10*3/uL (ref 0.1–1.0)
NEUTROS PCT: 69 % (ref 43–77)
Neutro Abs: 5.6 10*3/uL (ref 1.7–7.7)
Platelets: 290 10*3/uL (ref 150–400)
RBC: 4.6 MIL/uL (ref 3.87–5.11)
RDW: 17 % — ABNORMAL HIGH (ref 11.5–15.5)
WBC: 8.1 10*3/uL (ref 4.0–10.5)

## 2014-06-15 LAB — HEPATIC FUNCTION PANEL
ALT: 22 U/L (ref 0–35)
AST: 30 U/L (ref 0–37)
Albumin: 3.8 g/dL (ref 3.5–5.2)
Alkaline Phosphatase: 95 U/L (ref 39–117)
BILIRUBIN INDIRECT: 0.5 mg/dL (ref 0.2–1.2)
Bilirubin, Direct: 0.4 mg/dL — ABNORMAL HIGH (ref 0.0–0.3)
Total Bilirubin: 0.9 mg/dL (ref 0.2–1.2)
Total Protein: 6.9 g/dL (ref 6.0–8.3)

## 2014-06-15 LAB — BASIC METABOLIC PANEL WITH GFR
BUN: 32 mg/dL — ABNORMAL HIGH (ref 6–23)
CHLORIDE: 102 meq/L (ref 96–112)
CO2: 23 mEq/L (ref 19–32)
Calcium: 9.3 mg/dL (ref 8.4–10.5)
Creat: 1.72 mg/dL — ABNORMAL HIGH (ref 0.50–1.10)
GFR, Est African American: 31 mL/min — ABNORMAL LOW
GFR, Est Non African American: 27 mL/min — ABNORMAL LOW
GLUCOSE: 116 mg/dL — AB (ref 70–99)
Potassium: 3.9 mEq/L (ref 3.5–5.3)
Sodium: 139 mEq/L (ref 135–145)

## 2014-06-15 LAB — MAGNESIUM: Magnesium: 1.5 mg/dL (ref 1.5–2.5)

## 2014-06-15 NOTE — Progress Notes (Signed)
Assessment and Plan:  1. Essential hypertension Hypotension without symptoms- declines to go to the hospital- stop the lasix for 2-3 days, then add back on once daily, monitor BP, if SOB/CP go to ER. - check BMP - CBC with Differential - BASIC METABOLIC PANEL WITH GFR - Hepatic function panel  2. Atrial fibrillation with RVR Hr is 100/tachy likely due to dehydration, will check INR/BMP - Protime-INR  3. Chronic systolic CHF (congestive heart failure) Weight down 10 lbs but possibly too dry, will stop lasix for 2-3 days, then start back once daily, monitor weight, call if greater than 5 lb increase in 1 day of weight, will check BMP/Mag  4. Chronic obstructive pulmonary disease, unspecified COPD, unspecified chronic bronchitis type Lungs CTAB, monitor, if WBC is elevated will send in ABX for sinusitis  5. Gastroesophageal reflux disease without esophagitis Continue PPI/H2 blocker, diet discussed  6. Type 2 diabetes mellitus with diabetic chronic kidney disease Discussed general issues about diabetes pathophysiology and management., Educational material distributed., Suggested low cholesterol diet., Encouraged aerobic exercise., Discussed foot care., Reminded to get yearly retinal exam.  7. T12 compression fracture, sequela Continue tylenol and PT  8. CKD (chronic kidney disease), stage 4 (severe) Check BMP/Mag, stop lasix for now  9. Hyperlipidemia -continue medications, check lipids, decrease fatty foods, increase activity.   10. Vitamin D deficiency Continue supplement  11. Medication management - Magnesium  12. Obesity (BMI 38.76) Obesity with co morbidities- long discussion about weight loss, diet, and exercise  13. Tobacco abuse Smoking cessation-  instruction/counseling given, counseled patient on the dangers of tobacco use, advised patient to stop smoking, and reviewed strategies to maximize success, patient not ready to quit at this time.   14. At high risk for  falls Continue home health PT, high risk  Follow up in 1 month for PT INR or sooner pending labs. OVER 30 minutes of exam, counseling, chart review, referral performed   HPI 80 y.o.female presents for follow up after hospitalization and SNIF. She was in the hospital from 11/6-11/10 after fall that causes compression fracture T12 then [facility]. She is walking with walker, still in pain, worse with walking/movement, rest helps. She is not on any pain medication except on 634m tylenol once a day.   She complains of sinus congestion, and right eye itching/drainage clear for 1 week.   Also possible syncope from pain/dehydration, lasix was held in the hospital but restarted at home. She has CHF, her weight is down 10 lbs over 5 days, states she is breathing okay, has DOE but denies orthopnea/PND/edema.  Her BP is palpable at 80 systolic, she takes the lasix 25mTID, was held in the hospital. Denies dizziness, CP.  Wt Readings from Last 3 Encounters:  06/15/14 202 lb (91.627 kg)  06/11/14 212 lb (96.163 kg)  05/15/14 217 lb (98.431 kg)   She has afib, denies palpitations, CP, bleeding, HR is 100 but likely due to dehydration, on coumadin.  Lab Results  Component Value Date   INR 2.20* 05/20/2014   INR 2.87* 05/19/2014   INR 3.99* 05/18/2014    Past Medical History  Diagnosis Date  . Hyperlipidemia   . Gout   . History of small bowel obstruction   . COPD (chronic obstructive pulmonary disease)   . Substance abuse     Tobacco  . Depression   . Pulmonary hypertension   . Umbilical hernia   . Bronchitis   . Atrial fibrillation   .  High cholesterol   . Pneumonia   . Arthritis   . Abdominal aortic aneurysm     followed by Dr Kellie Simmering- 4.8 cm per [place] 6/12- [facility]  . Complication of anesthesia     slow to wake up per pt- anesthesia record on chart  . Hypertension   . Anxiety   . Atherosclerosis of aorta   . Systolic heart failure     EF 40-45%       Allergies  Allergen Reactions  . Hctz [Hydrochlorothiazide]   . Sulfa Antibiotics   . Tetracyclines & Related       Current Outpatient Prescriptions on File Prior to Visit  Medication Sig Dispense Refill  . acetaminophen (TYLENOL) 500 MG tablet Take 500 mg by mouth every 6 (six) hours as needed for moderate pain.    Marland Kitchen allopurinol (ZYLOPRIM) 300 MG tablet TAKE 1 TABLET ONCE DAILY. FOR GOUT 90 tablet 1  . atorvastatin (LIPITOR) 20 MG tablet Take 20 mg by mouth daily.    . Cholecalciferol ([employer] VIT D 5000 HIGH-POTENCY PO) Take 5,000 Units by mouth daily.     . citalopram (CELEXA) 20 MG tablet Take 1 tablet (20 mg total) by mouth daily. 90 tablet 1  . dexlansoprazole (DEXILANT) 60 MG capsule Take 1 capsule (60 mg total) by mouth daily. 30 capsule 3  . diltiazem (CARDIZEM CD) 240 MG 24 hr capsule Take 1 capsule (240 mg total) by mouth daily. 30 capsule 11  . docusate sodium 100 MG CAPS Take 100 mg by mouth 2 (two) times daily. 60 capsule 0  . fenofibrate micronized (LOFIBRA) 134 MG capsule TAKE 1 CAPSULE ONCE DAILY FOR BLOOD FATS. 30 capsule 3  . Magnesium 250 MG TABS Take 250 mg by mouth daily.    . metoprolol succinate (TOPROL-XL) 100 MG 24 hr tablet Take 1 tablet (100 mg total) by mouth daily. Take with or immediately following a meal. 30 tablet 11  . montelukast (SINGULAIR) 10 MG tablet Take 1 tablet (10 mg total) by mouth at bedtime. 30 tablet 3  . Multiple Vitamins-Minerals (CENTRUM) tablet Take 1 tablet by mouth daily.     . potassium chloride (K-DUR) 10 MEQ tablet Take 1 tablet (10 mEq total) by mouth 2 (two) times daily. 60 tablet 2  . traMADol (ULTRAM) 50 MG tablet Take one tablet by mouth every 6 hours as needed for pain 120 tablet 5  . warfarin (COUMADIN) 5 MG tablet Take 5 mg by mouth daily. Takes 1/2 tab 6 days a week and 1 tab on Wed     No current facility-administered medications on file prior to visit.    ROS:  Review of Systems  Constitutional: Positive for weight  loss and malaise/fatigue. Negative for fever, chills and diaphoresis.  HENT: Positive for congestion. Negative for ear discharge, ear pain, hearing loss, nosebleeds, sore throat and tinnitus.   Eyes: Positive for discharge and redness. Negative for blurred vision, double vision, photophobia and pain.  Respiratory: Positive for cough and shortness of breath. Negative for hemoptysis, sputum production, wheezing and stridor.   Cardiovascular: Positive for palpitations. Negative for chest pain, orthopnea, claudication, leg swelling and PND.  Gastrointestinal: Negative.   Genitourinary: Negative.   Musculoskeletal: Positive for back pain. Negative for myalgias, joint pain, falls and neck pain.  Skin: Negative.   Neurological: Negative.  Negative for weakness and headaches.  Psychiatric/Behavioral: Negative.    Physical Exam: Filed Weights   06/15/14 1609  Weight: 202 lb (91.627 kg)   Pulse  100  Temp(Src) 98.1 F (36.7 C)  Resp 16  Wt 202 lb (91.627 kg) General Appearance: Well nourished, in no apparent distress. Palpated BP at 80 Eyes: PERRLA, EOMs, conjunctiva no swelling or erythema Sinuses: No Frontal/maxillary tenderness ENT/Mouth: Ext aud canals clear, TMs without erythema, bulging. No erythema, swelling, or exudate on post pharynx.  Tonsils not swollen or erythematous. Left posterior pharynx with unscrapable dark area.  Neck: Supple, thyroid normal.  Respiratory: Respiratory effort normal, BS equal bilaterally, decreased breathsounds diffuse without rales, rhonchi, wheezing or stridor.  Cardio: Irreg irreg with no MRGs. Brisk peripheral pulses without edema.  Abdomen: Soft, + BS, obese Non tender, no guarding, rebound, hernias, masses. Lymphatics: Non tender without lymphadenopathy.  Musculoskeletal: Full ROM, 4/5 strength, unsteady gait with walker Skin: Warm, dry without rashes, lesions, ecchymosis.  Neuro: Cranial nerves intact. Normal muscle tone, no cerebellar symptoms.   Psych: Awake and oriented X 3, normal affect, Insight and Judgment appropriate.      Vicie Mutters, PA-C 4:29 PM [facility]

## 2014-06-15 NOTE — Patient Instructions (Addendum)
Please pick one of the over the counter allergy medications below and take it once daily for allergies.  Claritin or loratadine cheapest but likely the weakest  Zyrtec or certizine at night because it can make you sleepy The strongest is allegra or fexafinadine  Cheapest at [employer], [employer], [employer]  Your PT/INR is the test we use to check your coumadin level.  Your goal for your INR is between 2-3.  If you number is below 2 than your blood is thick and you need more coumadin. You are at risk for stroke or clotting during this time period.  If you number is above 3 than your blood is too thin and you need less coumadin or can eat more greens at this time. You are at risk for stomach or head bleeds and need to be careful.   Stop the lasix for for now, and monitor weight daily. After 1-3 days can add back on lasix 85m ONCE daily. Call the office if you gain more than 5 lbs in one day Do the following things EVERYDAY: 1) Weigh yourself in the morning before breakfast. Write it down and keep it in a log. 2) Take your medicines as prescribed 3) Eat low salt foods-Limit salt (sodium) to 2000 mg per day.  4) Stay as active as you can everyday 5) Limit all fluids for the day to less than 2 liters  You can take tylenol (5013m or tylenol arthritis (65032mwith the meloxicam/antiinflammatories. The max you can take of tylenol a day is 3000m31mily, this is a max of 6 pills a day of the regular tyelnol (500mg37m a max of 4 a day of the tylenol arthritis (650mg)52mlong as no other medications you are taking contain tylenol.

## 2014-06-16 LAB — PROTIME-INR
INR: 2.2 — ABNORMAL HIGH (ref <1.50)
Prothrombin Time: 24.4 seconds — ABNORMAL HIGH (ref 11.6–15.2)

## 2014-07-06 ENCOUNTER — Encounter: Payer: Self-pay | Admitting: Internal Medicine

## 2014-07-06 ENCOUNTER — Ambulatory Visit: Payer: Medicare Other | Admitting: Internal Medicine

## 2014-07-06 NOTE — Progress Notes (Signed)
Patient ID: Heather Stevenson, female   DOB: [birth year], 80 y.o.   MRN: [id number]  n o n   c o m p l i a n c e

## 2014-07-07 ENCOUNTER — Other Ambulatory Visit: Payer: Self-pay | Admitting: Physician Assistant

## 2014-07-07 ENCOUNTER — Other Ambulatory Visit: Payer: Self-pay | Admitting: Emergency Medicine

## 2014-08-09 ENCOUNTER — Other Ambulatory Visit: Payer: Self-pay | Admitting: Internal Medicine

## 2014-09-15 ENCOUNTER — Emergency Department (HOSPITAL_COMMUNITY): Payer: Medicare Other

## 2014-09-15 ENCOUNTER — Observation Stay (HOSPITAL_COMMUNITY)
Admission: EM | Admit: 2014-09-15 | Discharge: 2014-09-18 | Disposition: A | Discharge status: Skilled Nursing Facility | Payer: Medicare Other | Attending: Internal Medicine | Admitting: Internal Medicine

## 2014-09-15 ENCOUNTER — Encounter (HOSPITAL_COMMUNITY): Payer: Self-pay

## 2014-09-15 DIAGNOSIS — I129 Hypertensive chronic kidney disease with stage 1 through stage 4 chronic kidney disease, or unspecified chronic kidney disease: Secondary | ICD-10-CM | POA: Diagnosis not present

## 2014-09-15 DIAGNOSIS — F0390 Unspecified dementia without behavioral disturbance: Secondary | ICD-10-CM | POA: Insufficient documentation

## 2014-09-15 DIAGNOSIS — I5022 Chronic systolic (congestive) heart failure: Secondary | ICD-10-CM | POA: Insufficient documentation

## 2014-09-15 DIAGNOSIS — I10 Essential (primary) hypertension: Secondary | ICD-10-CM | POA: Diagnosis not present

## 2014-09-15 DIAGNOSIS — G459 Transient cerebral ischemic attack, unspecified: Secondary | ICD-10-CM | POA: Diagnosis not present

## 2014-09-15 DIAGNOSIS — N184 Chronic kidney disease, stage 4 (severe): Secondary | ICD-10-CM | POA: Diagnosis not present

## 2014-09-15 DIAGNOSIS — F329 Major depressive disorder, single episode, unspecified: Secondary | ICD-10-CM | POA: Insufficient documentation

## 2014-09-15 DIAGNOSIS — R200 Anesthesia of skin: Secondary | ICD-10-CM

## 2014-09-15 DIAGNOSIS — J449 Chronic obstructive pulmonary disease, unspecified: Secondary | ICD-10-CM | POA: Diagnosis not present

## 2014-09-15 DIAGNOSIS — R001 Bradycardia, unspecified: Secondary | ICD-10-CM

## 2014-09-15 DIAGNOSIS — I639 Cerebral infarction, unspecified: Secondary | ICD-10-CM

## 2014-09-15 DIAGNOSIS — Z7901 Long term (current) use of anticoagulants: Secondary | ICD-10-CM | POA: Insufficient documentation

## 2014-09-15 DIAGNOSIS — R55 Syncope and collapse: Secondary | ICD-10-CM

## 2014-09-15 DIAGNOSIS — Z7982 Long term (current) use of aspirin: Secondary | ICD-10-CM | POA: Insufficient documentation

## 2014-09-15 DIAGNOSIS — G319 Degenerative disease of nervous system, unspecified: Secondary | ICD-10-CM | POA: Diagnosis not present

## 2014-09-15 DIAGNOSIS — F17210 Nicotine dependence, cigarettes, uncomplicated: Secondary | ICD-10-CM | POA: Insufficient documentation

## 2014-09-15 DIAGNOSIS — R410 Disorientation, unspecified: Secondary | ICD-10-CM | POA: Diagnosis not present

## 2014-09-15 DIAGNOSIS — I482 Chronic atrial fibrillation: Secondary | ICD-10-CM | POA: Insufficient documentation

## 2014-09-15 DIAGNOSIS — K219 Gastro-esophageal reflux disease without esophagitis: Secondary | ICD-10-CM | POA: Diagnosis not present

## 2014-09-15 DIAGNOSIS — E785 Hyperlipidemia, unspecified: Secondary | ICD-10-CM | POA: Insufficient documentation

## 2014-09-15 DIAGNOSIS — R208 Other disturbances of skin sensation: Secondary | ICD-10-CM | POA: Diagnosis present

## 2014-09-15 DIAGNOSIS — N189 Chronic kidney disease, unspecified: Secondary | ICD-10-CM | POA: Insufficient documentation

## 2014-09-15 LAB — BASIC METABOLIC PANEL
ANION GAP: 8 (ref 5–15)
BUN: 20 mg/dL (ref 6–23)
CALCIUM: 9.2 mg/dL (ref 8.4–10.5)
CO2: 28 mmol/L (ref 19–32)
Chloride: 101 mmol/L (ref 96–112)
Creatinine, Ser: 1.1 mg/dL (ref 0.50–1.10)
GFR calc Af Amer: 52 mL/min — ABNORMAL LOW (ref >90)
GFR, EST NON AFRICAN AMERICAN: 45 mL/min — AB (ref >90)
Glucose, Bld: 98 mg/dL (ref 70–99)
POTASSIUM: 4.8 mmol/L (ref 3.5–5.1)
SODIUM: 137 mmol/L (ref 135–145)

## 2014-09-15 LAB — CBC WITH DIFFERENTIAL/PLATELET
BASOS PCT: 1 % (ref 0–1)
Basophils Absolute: 0 10*3/uL (ref 0.0–0.1)
EOS ABS: 0.3 10*3/uL (ref 0.0–0.7)
Eosinophils Relative: 4 % (ref 0–5)
HCT: 43.3 % (ref 36.0–46.0)
Hemoglobin: 14.2 g/dL (ref 12.0–15.0)
LYMPHS ABS: 2.4 10*3/uL (ref 0.7–4.0)
Lymphocytes Relative: 31 % (ref 12–46)
MCH: 30.5 pg (ref 26.0–34.0)
MCHC: 32.8 g/dL (ref 30.0–36.0)
MCV: 92.9 fL (ref 78.0–100.0)
Monocytes Absolute: 0.5 10*3/uL (ref 0.1–1.0)
Monocytes Relative: 7 % (ref 3–12)
NEUTROS PCT: 58 % (ref 43–77)
Neutro Abs: 4.5 10*3/uL (ref 1.7–7.7)
Platelets: 236 10*3/uL (ref 150–400)
RBC: 4.66 MIL/uL (ref 3.87–5.11)
RDW: 15.2 % (ref 11.5–15.5)
WBC: 7.8 10*3/uL (ref 4.0–10.5)

## 2014-09-15 LAB — CBG MONITORING, ED: Glucose-Capillary: 85 mg/dL (ref 70–99)

## 2014-09-15 LAB — PROTIME-INR
INR: 1.79 — ABNORMAL HIGH (ref 0.00–1.49)
PROTHROMBIN TIME: 21 s — AB (ref 11.6–15.2)

## 2014-09-15 LAB — I-STAT TROPONIN, ED: TROPONIN I, POC: 0.02 ng/mL (ref 0.00–0.08)

## 2014-09-15 LAB — MAGNESIUM: Magnesium: 2.1 mg/dL (ref 1.5–2.5)

## 2014-09-15 MED ORDER — SODIUM CHLORIDE 0.9 % IV BOLUS (SEPSIS)
500.0000 mL | Freq: Once | INTRAVENOUS | Status: AC
  Administered 2014-09-15: 500 mL via INTRAVENOUS

## 2014-09-15 NOTE — ED Notes (Signed)
EKG done at [facility]

## 2014-09-15 NOTE — ED Notes (Addendum)
Per EMS, Patient is coming from [facility], an independent living facility. Patient had a syncopal episode at 1600. Since the syncopal episode, patient reported numbness in the left face. Before EMS arrived, the numbness went away and currently patient has no complaints. Patient's stroke assessment was negative. Vitals per EMS: 126/86, 48 HR, 20 RR, 98% on RA, 110 CBG. Patient had history of Afib and has first degree HB on EKG.

## 2014-09-15 NOTE — ED Provider Notes (Signed)
CSN: [id number]     Arrival date & time 09/15/14  1942 History   First MD Initiated Contact with Patient 09/15/14 1954     Chief Complaint  Patient presents with  . Loss of Consciousness     (Consider location/radiation/quality/duration/timing/severity/associated sxs/prior Treatment) HPI Comments: 81 yo F hx of Atrial fibrillation, HTN, HLD, COPD, pulmonary hypertension, AAA, presents with CC of near syncope.  Pt states she was sitting at home, when she suddenly felt lightheaded.  She put her head down on the table, and then sat in a recliner, and the feeling passed.  During this she also had numbness isolated to left face, which resolved on its own in a few minutes.  Denies fever, chills, CP, SOB, palpitations, abdominal pain, nausea, vomiting, diarrhea, rash, myalgias, dysuria, or any other symptoms.  Denies previous occurrence.  Pt compliant with home medications.  Takes Cardizem and Toprol-XL for her atrial fibrillation.  No other concerns.    The history is provided by the patient. No language interpreter was used.    Past Medical History  Diagnosis Date  . Hyperlipidemia   . Gout   . History of small bowel obstruction   . COPD (chronic obstructive pulmonary disease)   . Substance abuse     Tobacco  . Depression   . Pulmonary hypertension   . Umbilical hernia   . Bronchitis   . Atrial fibrillation   . High cholesterol   . Pneumonia   . Arthritis   . Abdominal aortic aneurysm     followed by Dr Kellie Simmering- 4.8 cm per [place] 6/12- [facility]  . Complication of anesthesia     slow to wake up per pt- anesthesia record on chart  . Hypertension   . Anxiety   . Atherosclerosis of aorta   . Systolic heart failure     EF 40-45%    Past Surgical History  Procedure Laterality Date  . Small intestine surgery  05/2010  . Abdominal hysterectomy      Vaginal  . Hernia repair  05/2010    incarcerated Umbilical hernia  . Tonsillectomy    . Incisional hernia repair  02/21/2012    Procedure:  LAPAROSCOPIC INCISIONAL HERNIA;  Surgeon: Harl Bowie, MD;  Location: [facility];  Service: General;  Laterality: N/A;   Family History  Problem Relation Age of Onset  . Cancer Mother   . Heart disease Father   . Cancer Daughter   . Deep vein thrombosis Daughter    History  Substance Use Topics  . Smoking status: Current Every Day Smoker -- 0.50 packs/day for 50 years    Types: Cigarettes  . Smokeless tobacco: Never Used  . Alcohol Use: No   OB History    No data available     Review of Systems  Constitutional: Negative for fever and chills.  Respiratory: Negative for cough and shortness of breath.   Cardiovascular: Negative for chest pain.  Gastrointestinal: Negative for nausea, vomiting, abdominal pain and diarrhea.  Genitourinary: Negative for dysuria.  Musculoskeletal: Negative for myalgias.  Skin: Negative for rash.  Neurological: Positive for light-headedness. Negative for dizziness, weakness, numbness and headaches.  Hematological: Negative for adenopathy. Does not bruise/bleed easily.  All other systems reviewed and are negative.     Allergies  Hctz; Sulfa antibiotics; and Tetracyclines & related  Home Medications   Prior to Admission medications   Medication Sig Start Date End Date Taking? Authorizing Provider  acetaminophen (TYLENOL) 500 MG tablet Take 500 mg by  mouth every 6 (six) hours as needed for moderate pain.    Historical Provider, MD  allopurinol (ZYLOPRIM) 300 MG tablet TAKE 1 TABLET ONCE DAILY. FOR GOUT 01/16/14   Kelby Aline, PA-C  atorvastatin (LIPITOR) 20 MG tablet Take 20 mg by mouth daily.    Historical Provider, MD  Cholecalciferol (CVS VIT D 5000 HIGH-POTENCY PO) Take 5,000 Units by mouth daily.     Historical Provider, MD  citalopram (CELEXA) 20 MG tablet Take 1 tablet (20 mg total) by mouth daily. 05/01/14 05/01/15  Unk Pinto, MD  dexlansoprazole (DEXILANT) 60 MG capsule Take 1 capsule (60 mg total) by mouth daily. 03/26/14    Unk Pinto, MD  diltiazem (CARDIZEM CD) 240 MG 24 hr capsule Take 1 capsule (240 mg total) by mouth daily. 01/02/14   Unk Pinto, MD  docusate sodium 100 MG CAPS Take 100 mg by mouth 2 (two) times daily. 05/16/14   Janece Canterbury, MD  fenofibrate micronized (LOFIBRA) 134 MG capsule TAKE 1 CAPSULE ONCE DAILY FOR BLOOD FATS. 07/07/14   Unk Pinto, MD  furosemide (LASIX) 20 MG tablet TAKE 1 TABLET 3 TIMES A DAY. 07/07/14   Unk Pinto, MD  Magnesium 250 MG TABS Take 250 mg by mouth daily.    Historical Provider, MD  metoprolol succinate (TOPROL-XL) 100 MG 24 hr tablet Take 1 tablet (100 mg total) by mouth daily. Take with or immediately following a meal. 01/02/14   Unk Pinto, MD  montelukast (SINGULAIR) 10 MG tablet Take 1 tablet (10 mg total) by mouth at bedtime. 03/26/14   Unk Pinto, MD  Multiple Vitamins-Minerals (CENTRUM) tablet Take 1 tablet by mouth daily.     Historical Provider, MD  potassium chloride (K-DUR) 10 MEQ tablet Take 1 tablet (10 mEq total) by mouth 2 (two) times daily. 04/09/14   Unk Pinto, MD  traMADol Veatrice Bourbon) 50 MG tablet Take one tablet by mouth every 6 hours as needed for pain 05/21/14   Tiffany L Reed, DO  warfarin (COUMADIN) 5 MG tablet Take 5 mg by mouth daily. Takes 1/2 tab 6 days a week and 1 tab on Wed    Historical Provider, MD   BP 147/74 mmHg  Pulse 47  Temp(Src) 98 F (36.7 C)  Resp 13  SpO2 94% Physical Exam  Constitutional: She is oriented to person, place, and time. She appears well-developed and well-nourished.  HENT:  Head: Normocephalic and atraumatic.  Right Ear: External ear normal.  Left Ear: External ear normal.  Nose: Nose normal.  Mouth/Throat: Oropharynx is clear and moist.  Eyes: Conjunctivae and EOM are normal. Pupils are equal, round, and reactive to light.  Neck: Normal range of motion. Neck supple. No JVD present. No tracheal deviation present. No thyromegaly present.  Cardiovascular: Regular rhythm,  normal heart sounds and intact distal pulses.   Bradycardia  Pulmonary/Chest: Effort normal and breath sounds normal. No respiratory distress. She has no wheezes. She has no rales. She exhibits no tenderness.  Abdominal: Soft. Bowel sounds are normal. She exhibits no distension and no mass. There is no tenderness. There is no rebound and no guarding.  Musculoskeletal: Normal range of motion.  Lymphadenopathy:    She has no cervical adenopathy.  Neurological: She is alert and oriented to person, place, and time.  Skin: Skin is warm and dry.  Nursing note and vitals reviewed.   ED Course  Procedures (including critical care time) Labs Review Labs Reviewed  BASIC METABOLIC PANEL - Abnormal; Notable for the following:  GFR calc non Af Amer 45 (*)    GFR calc Af Amer 52 (*)    All other components within normal limits  URINALYSIS, ROUTINE W REFLEX MICROSCOPIC - Abnormal; Notable for the following:    APPearance CLOUDY (*)    All other components within normal limits  PROTIME-INR - Abnormal; Notable for the following:    Prothrombin Time 21.0 (*)    INR 1.79 (*)    All other components within normal limits  LIPID PANEL - Abnormal; Notable for the following:    Triglycerides 185 (*)    All other components within normal limits  CREATININE, SERUM - Abnormal; Notable for the following:    GFR calc non Af Amer 49 (*)    GFR calc Af Amer 57 (*)    All other components within normal limits  PROTIME-INR - Abnormal; Notable for the following:    Prothrombin Time 19.5 (*)    INR 1.63 (*)    All other components within normal limits  CBC WITH DIFFERENTIAL/PLATELET  MAGNESIUM  CBC  HEMOGLOBIN A1C  I-STAT TROPOININ, ED  CBG MONITORING, ED    Imaging Review Ct Head Wo Contrast  09/15/2014   CLINICAL DATA:  Syncopal episode.  Numbness in the left face.  EXAM: CT HEAD WITHOUT CONTRAST  TECHNIQUE: Contiguous axial images were obtained from the base of the skull through the vertex without  intravenous contrast.  COMPARISON:  05/15/2014  FINDINGS: There is no evidence of mass effect, midline shift, or extra-axial fluid collections. There is no evidence of a space-occupying lesion or intracranial hemorrhage. There is no evidence of a cortical-based area of acute infarction. There is generalized cerebral atrophy. There is periventricular white matter low attenuation likely secondary to microangiopathy.  The ventricles and sulci are appropriate for the patient's age. The basal cisterns are patent.  Visualized portions of the orbits are unremarkable. The visualized portions of the paranasal sinuses and mastoid air cells are unremarkable. Cerebrovascular atherosclerotic calcifications are noted.  The osseous structures are unremarkable.  IMPRESSION: 1. No acute intracranial pathology. 2. Chronic microvascular disease and cerebral atrophy.   Electronically Signed   By: Kathreen Devoid   On: 09/15/2014 20:58     EKG Interpretation   Date/Time:  Tuesday September 15 2014 19:48:02 EST Ventricular Rate:  46 PR Interval:  256 QRS Duration: 96 QT Interval:  520 QTC Calculation: 455 R Axis:   -58 Text Interpretation:  Sinus rhythm Premature atrial complexes Prolonged PR  interval Probable left atrial enlargement Left anterior fascicular block  When compared with ECG of 05/17/2014, Sinus rhythm has replaced Atrial  fibrillation Nonspecific T wave abnormality is no longer Present Confirmed  by [facility]  MD, DAVID ([id number]) on 09/15/2014 7:57:30 PM      MDM   Final diagnoses:  Syncope, near  Bradycardia  Left facial numbness   81 yo F hx of Atrial fibrillation, HTN, HLD, COPD, pulmonary hypertension, AAA, presents with CC of near syncope.   Physical exam as above.  HR 30-40's.  EKG with 1st degree block, which is new for patient, hx of atrial fibrillation.  All other VS WNL.    CBC, BMP, Mag, Troponin, CBG all WNL.  INR subtherapeutic to 1.79.  CT head  No acute pathology.    Pt remains  asymptomatic. Question near syncope 2/2 AV nodal blockade, bradycardia.    Medicine consulted for admission.  Dr. Ernestina Patches requests neurology consult as well given transient facial numbness.  I spoke with Dr. Nicole Kindred  who recommends MRI brain, and if positive will see patient.  Dr. Ernestina Patches made aware.  Pt understands and agrees with plan.   Discussed pt with my attending Dr. Roxanne Mins.   Sinda Du   Sinda Du, MD 37/94/44 [id number]  Delora Fuel, MD 06/30/40 1464

## 2014-09-15 NOTE — ED Notes (Signed)
Patient transported to CT 

## 2014-09-15 NOTE — ED Notes (Signed)
Pt returned to room

## 2014-09-16 ENCOUNTER — Observation Stay (HOSPITAL_COMMUNITY): Payer: Medicare Other

## 2014-09-16 DIAGNOSIS — R208 Other disturbances of skin sensation: Secondary | ICD-10-CM

## 2014-09-16 DIAGNOSIS — G459 Transient cerebral ischemic attack, unspecified: Secondary | ICD-10-CM

## 2014-09-16 DIAGNOSIS — R001 Bradycardia, unspecified: Secondary | ICD-10-CM

## 2014-09-16 LAB — CBC
HCT: 43.3 % (ref 36.0–46.0)
Hemoglobin: 14.5 g/dL (ref 12.0–15.0)
MCH: 31 pg (ref 26.0–34.0)
MCHC: 33.5 g/dL (ref 30.0–36.0)
MCV: 92.5 fL (ref 78.0–100.0)
PLATELETS: 224 10*3/uL (ref 150–400)
RBC: 4.68 MIL/uL (ref 3.87–5.11)
RDW: 15.1 % (ref 11.5–15.5)
WBC: 7.1 10*3/uL (ref 4.0–10.5)

## 2014-09-16 LAB — PROTIME-INR
INR: 1.63 — AB (ref 0.00–1.49)
Prothrombin Time: 19.5 seconds — ABNORMAL HIGH (ref 11.6–15.2)

## 2014-09-16 LAB — URINALYSIS, ROUTINE W REFLEX MICROSCOPIC
BILIRUBIN URINE: NEGATIVE
GLUCOSE, UA: NEGATIVE mg/dL
Hgb urine dipstick: NEGATIVE
Ketones, ur: NEGATIVE mg/dL
Leukocytes, UA: NEGATIVE
NITRITE: NEGATIVE
Protein, ur: NEGATIVE mg/dL
Specific Gravity, Urine: 1.015 (ref 1.005–1.030)
UROBILINOGEN UA: 1 mg/dL (ref 0.0–1.0)
pH: 6 (ref 5.0–8.0)

## 2014-09-16 LAB — LIPID PANEL
CHOLESTEROL: 128 mg/dL (ref 0–200)
HDL: 41 mg/dL (ref >39)
LDL Cholesterol: 50 mg/dL (ref 0–99)
TRIGLYCERIDES: 185 mg/dL — AB (ref <150)
Total CHOL/HDL Ratio: 3.1 RATIO
VLDL: 37 mg/dL (ref 0–40)

## 2014-09-16 LAB — TSH: TSH: 2.121 u[IU]/mL (ref 0.350–4.500)

## 2014-09-16 LAB — CREATININE, SERUM
CREATININE: 1.03 mg/dL (ref 0.50–1.10)
GFR calc non Af Amer: 49 mL/min — ABNORMAL LOW (ref >90)
GFR, EST AFRICAN AMERICAN: 57 mL/min — AB (ref >90)

## 2014-09-16 MED ORDER — METOPROLOL TARTRATE 12.5 MG HALF TABLET
12.5000 mg | ORAL_TABLET | Freq: Two times a day (BID) | ORAL | Status: DC
  Administered 2014-09-16 – 2014-09-18 (×5): 12.5 mg via ORAL
  Filled 2014-09-16 (×5): qty 1

## 2014-09-16 MED ORDER — MAGNESIUM OXIDE 400 (241.3 MG) MG PO TABS
400.0000 mg | ORAL_TABLET | Freq: Every day | ORAL | Status: DC
  Administered 2014-09-16 – 2014-09-18 (×3): 400 mg via ORAL
  Filled 2014-09-16 (×3): qty 1

## 2014-09-16 MED ORDER — WARFARIN - PHARMACIST DOSING INPATIENT: Freq: Every day | Status: DC

## 2014-09-16 MED ORDER — ASPIRIN EC 81 MG PO TBEC
81.0000 mg | DELAYED_RELEASE_TABLET | Freq: Every day | ORAL | Status: DC
  Administered 2014-09-16 – 2014-09-18 (×3): 81 mg via ORAL
  Filled 2014-09-16 (×3): qty 1

## 2014-09-16 MED ORDER — ENSURE COMPLETE PO LIQD
237.0000 mL | Freq: Two times a day (BID) | ORAL | Status: DC
  Administered 2014-09-16: 237 mL via ORAL

## 2014-09-16 MED ORDER — DILTIAZEM HCL ER COATED BEADS 240 MG PO CP24
240.0000 mg | ORAL_CAPSULE | Freq: Every day | ORAL | Status: DC
  Administered 2014-09-16: 240 mg via ORAL
  Filled 2014-09-16: qty 1

## 2014-09-16 MED ORDER — DILTIAZEM HCL ER COATED BEADS 180 MG PO CP24
180.0000 mg | ORAL_CAPSULE | Freq: Every day | ORAL | Status: DC
  Administered 2014-09-17 – 2014-09-18 (×2): 180 mg via ORAL
  Filled 2014-09-16 (×2): qty 1

## 2014-09-16 MED ORDER — SENNOSIDES-DOCUSATE SODIUM 8.6-50 MG PO TABS: 1.0000 | ORAL_TABLET | Freq: Every evening | ORAL | Status: DC | PRN

## 2014-09-16 MED ORDER — WARFARIN SODIUM 5 MG PO TABS
5.0000 mg | ORAL_TABLET | Freq: Once | ORAL | Status: AC
  Administered 2014-09-16: 5 mg via ORAL
  Filled 2014-09-16: qty 1

## 2014-09-16 MED ORDER — HALOPERIDOL LACTATE 5 MG/ML IJ SOLN: 0.5000 mg | Freq: Three times a day (TID) | INTRAMUSCULAR | Status: DC | PRN

## 2014-09-16 MED ORDER — PANTOPRAZOLE SODIUM 40 MG PO TBEC
40.0000 mg | DELAYED_RELEASE_TABLET | Freq: Every day | ORAL | Status: DC
  Administered 2014-09-16 – 2014-09-18 (×3): 40 mg via ORAL
  Filled 2014-09-16 (×3): qty 1

## 2014-09-16 MED ORDER — LORAZEPAM 2 MG/ML IJ SOLN
2.0000 mg | Freq: Once | INTRAMUSCULAR | Status: AC
  Administered 2014-09-16: 2 mg via INTRAVENOUS
  Filled 2014-09-16: qty 1

## 2014-09-16 MED ORDER — STROKE: EARLY STAGES OF RECOVERY BOOK
Freq: Once | Status: AC
  Administered 2014-09-17
  Filled 2014-09-16: qty 1

## 2014-09-16 MED ORDER — CITALOPRAM HYDROBROMIDE 10 MG PO TABS
20.0000 mg | ORAL_TABLET | Freq: Every day | ORAL | Status: DC
  Administered 2014-09-16 – 2014-09-18 (×3): 20 mg via ORAL
  Filled 2014-09-16 (×3): qty 2

## 2014-09-16 MED ORDER — ENOXAPARIN SODIUM 30 MG/0.3ML ~~LOC~~ SOLN
30.0000 mg | Freq: Every day | SUBCUTANEOUS | Status: DC
  Administered 2014-09-16 – 2014-09-17 (×2): 30 mg via SUBCUTANEOUS
  Filled 2014-09-16 (×2): qty 0.3

## 2014-09-16 MED ORDER — ACETAMINOPHEN 500 MG PO TABS
500.0000 mg | ORAL_TABLET | Freq: Four times a day (QID) | ORAL | Status: DC | PRN
Start: 2014-09-16 — End: 2014-09-18
  Administered 2014-09-17: 500 mg via ORAL
  Filled 2014-09-16: qty 1

## 2014-09-16 MED ORDER — ROSUVASTATIN CALCIUM 5 MG PO TABS
5.0000 mg | ORAL_TABLET | Freq: Every day | ORAL | Status: DC
  Administered 2014-09-16 – 2014-09-18 (×3): 5 mg via ORAL
  Filled 2014-09-16 (×3): qty 1

## 2014-09-16 MED ORDER — LORAZEPAM 2 MG/ML IJ SOLN
2.0000 mg | Freq: Once | INTRAMUSCULAR | Status: DC
  Filled 2014-09-16: qty 1

## 2014-09-16 NOTE — ED Notes (Signed)
Admitting at bedside

## 2014-09-16 NOTE — Progress Notes (Signed)
UR completed.

## 2014-09-16 NOTE — Progress Notes (Signed)
   09/16/14 0841  What Happened  Was fall witnessed? Yes  Who witnessed fall? Rayburn Ma McGreth-safety sitter  Patients activity before fall bathroom-unassisted  Point of contact other (comment) (knees)  Was patient injured? No  Follow Up  MD notified Dyann Kief  Time MD notified [phone]  Family notified (family not notified yet)  Additional tests No  Simple treatment Other (comment) (N/A)  Progress note created (see row info) Yes  Adult Fall Risk Assessment  Risk Factor Category (scoring not indicated) High fall risk per protocol (document High fall risk)  Patient's Fall Risk High Fall Risk (>13 points)  Adult Fall Risk Interventions  Required Bundle Interventions *See Row Information* High fall risk - low, moderate, and high requirements implemented  Additional Interventions Fall risk signage  Vitals  Temp 97.7 F (36.5 C)  Temp Source Oral  BP (!) 148/53 mmHg  BP Location Right Arm  BP Method Automatic  Patient Position (if appropriate) Sitting  Pulse Rate 63  Pulse Rate Source Dinamap  PCA/Epidural/Spinal Assessment  Respiratory Pattern Regular  Neurological  Neuro (WDL) X  Level of Consciousness Alert  Orientation Level Oriented to person;Oriented to time  Cognition Impulsive  Speech Clear  Pupil Assessment  Yes  R Pupil Size (mm) 3  R Pupil Shape Round  R Pupil Reaction Brisk  L Pupil Size (mm) 3  L Pupil Shape Round  L Pupil Reaction Brisk  Additional Pupil Assessments No  Facial Symmetry Symmetrical  R Hand Grip Present;Strong  L Hand Grip Present;Strong  Right Pronator Drift Absent  Left Pronator Drift Absent  R Foot Dorsiflexion Present;Strong  L Foot Dorsiflexion Present;Strong  R Foot Plantar Flexion Present;Strong  L Foot Plantar Flexion Present;Strong  Neuro Symptoms Agitation  Reflexes  Gag Present  Cough Present  R Corneal Intact  L Corneal Intact  R Babinski Absent  L Babinski Absent  Glasgow Coma Scale  Eye Opening 4  Best Verbal Response  (NON-intubated) 4  Best Motor Response 6  Glasgow Coma Scale Score 14  Musculoskeletal  Musculoskeletal (WDL) X  Assistive Device None  Generalized Weakness Yes  Integumentary  Integumentary (WDL) WDL

## 2014-09-16 NOTE — ED Notes (Signed)
Updated nurse

## 2014-09-16 NOTE — Progress Notes (Signed)
Nutrition Brief Note  Patient identified on the Malnutrition Screening Tool (MST) Report  Wt Readings from Last 15 Encounters:  09/16/14 192 lb 1.6 oz (87.136 kg)  06/15/14 202 lb (91.627 kg)  06/11/14 212 lb (96.163 kg)  05/15/14 217 lb (98.431 kg)  05/15/14 217 lb (98.431 kg)  05/01/14 216 lb 3.2 oz (98.068 kg)  01/07/14 229 lb 6.4 oz (104.055 kg)  12/22/13 226 lb (102.513 kg)  12/16/13 232 lb (105.235 kg)  12/09/13 223 lb 8.7 oz (101.4 kg)  12/04/13 230 lb (104.327 kg)  11/28/13 200 lb (90.719 kg)  11/25/13 232 lb (105.235 kg)  11/06/13 228 lb 3.2 oz (103.511 kg)  10/02/13 236 lb 9.6 oz (107.321 kg)    Body mass index is 30.08 kg/(m^2). Patient meets criteria for Obesity based on current BMI. Patient states that she lost from 235 lbs to current weight due to being at SNF and not liking the foods. She states her appetite is good and she was eating well PTA. Patient's weight loss not considered significant for time frame. Some mild muscle wasting noted in legs.   Current diet order is Heart Healthy, patient is consuming approximately 75% of meals at this time. Labs and medications reviewed.   No nutrition interventions warranted at this time. If nutrition issues arise, please consult RD.   Pryor Ochoa RD, LDN Inpatient Clinical Dietitian Pager: [phone] After Hours Pager: [phone]

## 2014-09-16 NOTE — Progress Notes (Signed)
ANTICOAGULATION CONSULT NOTE - Initial Consult  Pharmacy Consult for Coumadin Indication: atrial fibrillation  Allergies  Allergen Reactions  . Hctz [Hydrochlorothiazide]     unknown  . Sulfa Antibiotics     unknown  . Tetracyclines & Related     unknown    Patient Measurements:    Vital Signs: Temp: 98 F (36.7 C) (03/08 1946) BP: 135/76 mmHg (03/09 0045) Pulse Rate: 45 (03/09 0045)  Labs:  Recent Labs  09/15/14 2110  HGB 14.2  HCT 43.3  PLT 236  LABPROT 21.0*  INR 1.79*  CREATININE 1.10    CrCl cannot be calculated (Unknown ideal weight.).   Medical History: Past Medical History  Diagnosis Date  . Hyperlipidemia   . Gout   . History of small bowel obstruction   . COPD (chronic obstructive pulmonary disease)   . Substance abuse     Tobacco  . Depression   . Pulmonary hypertension   . Umbilical hernia   . Bronchitis   . Atrial fibrillation   . High cholesterol   . Pneumonia   . Arthritis   . Abdominal aortic aneurysm     followed by Dr Kellie Simmering- 4.8 cm per [place] 6/12- EPIC  . Complication of anesthesia     slow to wake up per pt- anesthesia record on chart  . Hypertension   . Anxiety   . Atherosclerosis of aorta   . Systolic heart failure     EF 40-45%     Medications:  Allopurinol  Celexa  Dexilant  Cardizem  Colace  Lofibra  Lasix  Toprol XL  Singulair  KCl  MVI  Crestor  Coumadin 2.5 mg daily except 5 mg Wed  Assessment: 81 yo female admitted with bradycardia/near syncope, h/o Afib, to continue Coumadin Goal of Therapy:  INR 2-3 Monitor platelets by anticoagulation protocol: Yes   Plan:  Coumadin 5 mg now Daily INR  Herberth Deharo, Bronson Curb 09/16/2014,1:17 AM

## 2014-09-16 NOTE — Progress Notes (Signed)
Patient seen and examined. Admitted after midnight secondary to left side numbness in order to r/o TIA vs stroke. CT is neg and MRI unable to be completed secondary to inability on the patient stay still. No CP, no SOB. On exam patient is very confused and having difficulty organizing her thoughts; later symptoms present after ativan given. Please referred to H&P written by Dr. Ernestina Patches for further details/info and admission.  Plan: -avoid benzodiazepine  -use haldol for agitation -tylenol for pain -coumadin per pharmacy -will follow results of stroke work up -will check B12 and TSH -adjustment to lopressor and diltiazem for bradycardia   Barton Dubois [phone]

## 2014-09-16 NOTE — Progress Notes (Signed)
  Echocardiogram 2D Echocardiogram has been performed.  Labib Cwynar 09/16/2014, 1:27 PM

## 2014-09-16 NOTE — H&P (Signed)
Hospitalist Admission History and Physical  Patient name: Heather Stevenson Medical record number: [id number] Date of birth: 1930-05-18 Age: 81 y.o. Gender: female  Primary Care Provider: Alesia Richards, MD  Chief Complaint: L facial numbness, bradycardia   History of Present Illness:This is a 81 y.o. year old female with significant past medical history of gastric fibrillation, systolic CHF, COPD, tobacco abuse, stage IV CKD presenting with weakness, L sided facial numbness, bradycardia . Patient reports progressive onset of generalized weakness as well as left-sided facial numbness approximately around 4 PM today. Symptoms persisted throughout the course of the day and daily worsens which is when patient subsequently called EMS. Denies any true hemiparesis or confusion. Tobaccoabuse.IsonCoumadinforA. Fib.Notchronicallychecked.Deniesanychestpainorshortnessofbreath. Presented to the ER afebrile, heart rate in 30s to 50s, respirations tens to 20s, blood pressure in the 120s to 160s over 90s to 100s. Satting 96% on room air. CBC and BMP within normal limits. INR 1.8. CT of the head within normal limits. Patient states that left facial numbness resolved by the time of arrival into the ER. Denies any prior history of stroke in the past. Smokes at least one pack per day.  Assessment and Plan: Heather Stevenson is a 81 y.o. year old female presenting with L facial numbness, bradycardia   Active Problems:   Left facial numbness   1- L facial numbness -some concern for TIA -multiple RFs including afib, CHF, tobacco abuse -proceed down stroke pathway including MRI, MRA, 2D ECHO, risk stratification labs, carotid dopplers -start baby ASA -neuro consult   2- Bradycardia -may also be contributor to above -hold metoprolol  -tele bed -atropine prn  -follow  3- systolic CHF  -EF 71-69% on 2D ECHO 11/2013 -euvolemic -follow  4- COPD  -resp status stable -cont home regimen  -follow    5-Atrial fibrillation -cont dilt  -noted bradycardia-hold BB -subtherapeutic INR -coumadin per pharmacy   FEN/GI: heart healthy diet  Prophylaxis: coumadin  Disposition: pending further evaluation  Code Status:Full Code    Patient Active Problem List   Diagnosis Date Noted  . Left facial numbness 09/16/2014  . Syncope, near 05/26/2014  . Chronic systolic CHF (congestive heart failure) 05/26/2014  . Atrial fibrillation, chronic 05/26/2014  . GERD (gastroesophageal reflux disease) 05/26/2014  . Orthostatic hypotension 05/17/2014  . T12 compression fracture 05/15/2014  . Back pain 05/15/2014  . Controlled atrial fibrillation 05/15/2014  . Tobacco abuse 05/15/2014  . CKD (chronic kidney disease), stage IV 05/15/2014  . AAA (abdominal aortic aneurysm) 05/15/2014  . Thoracic compression fracture 05/15/2014  . Acute respiratory failure with hypoxia 12/05/2013  . Atrial fibrillation with RVR 12/04/2013  . ASHD w/Diastolic Heart Failure 67/89/3810  . CKD (chronic kidney disease) 12/04/2013  . T2_NIDDM w/Stage 4 CKD (GFR 28 ml/min) 10/02/2013  . Vitamin D deficiency 10/02/2013  . Medication management 10/02/2013  . Obesity (BMI 38.76) 10/02/2013  . Hypertension   . Hyperlipidemia   . Abdominal aortic aneurysm   . COPD (chronic obstructive pulmonary disease)   . History of small bowel obstruction   . Incisional hernia 02/01/2012   Past Medical History: Past Medical History  Diagnosis Date  . Hyperlipidemia   . Gout   . History of small bowel obstruction   . COPD (chronic obstructive pulmonary disease)   . Substance abuse     Tobacco  . Depression   . Pulmonary hypertension   . Umbilical hernia   . Bronchitis   . Atrial fibrillation   . High cholesterol   . Pneumonia   .  Arthritis   . Abdominal aortic aneurysm     followed by Dr Kellie Simmering- 4.8 cm per [place] 6/12- EPIC  . Complication of anesthesia     slow to wake up per pt- anesthesia record on chart  . Hypertension    . Anxiety   . Atherosclerosis of aorta   . Systolic heart failure     EF 40-45%     Past Surgical History: Past Surgical History  Procedure Laterality Date  . Small intestine surgery  05/2010  . Abdominal hysterectomy      Vaginal  . Hernia repair  05/2010    incarcerated Umbilical hernia  . Tonsillectomy    . Incisional hernia repair  02/21/2012    Procedure: LAPAROSCOPIC INCISIONAL HERNIA;  Surgeon: Harl Bowie, MD;  Location: [facility];  Service: General;  Laterality: N/A;    Social History: History   Social History  . Marital Status: Widowed    Spouse Name: N/A  . Number of Children: N/A  . Years of Education: N/A   Social History Main Topics  . Smoking status: Current Every Day Smoker -- 0.50 packs/day for 50 years    Types: Cigarettes  . Smokeless tobacco: Never Used  . Alcohol Use: No  . Drug Use: No  . Sexual Activity: Not on file   Other Topics Concern  . None   Social History Narrative    Family History: Family History  Problem Relation Age of Onset  . Cancer Mother   . Heart disease Father   . Cancer Daughter   . Deep vein thrombosis Daughter     Allergies: Allergies  Allergen Reactions  . Hctz [Hydrochlorothiazide]     unknown  . Sulfa Antibiotics     unknown  . Tetracyclines & Related     unknown    Current Facility-Administered Medications  Medication Dose Route Frequency Provider Last Rate Last Dose  .  stroke: mapping our early stages of recovery book   Does not apply Once Deneise Lever, MD      . acetaminophen (TYLENOL) tablet 500 mg  500 mg Oral Q6H PRN Deneise Lever, MD      . aspirin EC tablet 81 mg  81 mg Oral Daily Deneise Lever, MD      . citalopram (CELEXA) tablet 20 mg  20 mg Oral Daily Deneise Lever, MD      . diltiazem (CARDIZEM CD) 24 hr capsule 240 mg  240 mg Oral Daily Deneise Lever, MD      . enoxaparin (LOVENOX) injection 30 mg  30 mg Subcutaneous Q24H Deneise Lever, MD      . Magnesium TABS 250 mg   250 mg Oral Daily Deneise Lever, MD      . pantoprazole (PROTONIX) EC tablet 40 mg  40 mg Oral Daily Deneise Lever, MD      . rosuvastatin (CRESTOR) tablet 5 mg  5 mg Oral Daily Deneise Lever, MD      . senna-docusate (Senokot-S) tablet 1 tablet  1 tablet Oral QHS PRN Deneise Lever, MD      . warfarin (COUMADIN) tablet 5 mg  5 mg Oral Once Deneise Lever, MD      . Warfarin - Pharmacist Dosing Inpatient   Does not apply [id number] Deneise Lever, MD       Current Outpatient Prescriptions  Medication Sig Dispense Refill  . acetaminophen (TYLENOL) 500 MG tablet Take 500 mg by mouth  every 6 (six) hours as needed for moderate pain.    Marland Kitchen allopurinol (ZYLOPRIM) 300 MG tablet TAKE 1 TABLET ONCE DAILY. FOR GOUT 90 tablet 1  . Cholecalciferol ([employer] VIT D 5000 HIGH-POTENCY PO) Take 5,000 Units by mouth daily.     . citalopram (CELEXA) 20 MG tablet Take 1 tablet (20 mg total) by mouth daily. 90 tablet 1  . dexlansoprazole (DEXILANT) 60 MG capsule Take 1 capsule (60 mg total) by mouth daily. 30 capsule 3  . diltiazem (CARDIZEM CD) 240 MG 24 hr capsule Take 1 capsule (240 mg total) by mouth daily. 30 capsule 11  . docusate sodium 100 MG CAPS Take 100 mg by mouth 2 (two) times daily. 60 capsule 0  . fenofibrate micronized (LOFIBRA) 134 MG capsule TAKE 1 CAPSULE ONCE DAILY FOR BLOOD FATS. 30 capsule 0  . Magnesium 250 MG TABS Take 250 mg by mouth daily.    . metoprolol succinate (TOPROL-XL) 100 MG 24 hr tablet Take 1 tablet (100 mg total) by mouth daily. Take with or immediately following a meal. 30 tablet 11  . montelukast (SINGULAIR) 10 MG tablet Take 1 tablet (10 mg total) by mouth at bedtime. 30 tablet 3  . Multiple Vitamins-Minerals (CENTRUM) tablet Take 1 tablet by mouth daily.     . potassium chloride (K-DUR) 10 MEQ tablet Take 1 tablet (10 mEq total) by mouth 2 (two) times daily. 60 tablet 2  . rosuvastatin (CRESTOR) 5 MG tablet Take 5 mg by mouth daily.    Marland Kitchen warfarin (COUMADIN) 5 MG tablet Take  2.5-5 mg by mouth See admin instructions. Takes 2.67m half of the 577m All days except on Wednesday take a whole 47m47mab    . furosemide (LASIX) 20 MG tablet TAKE 1 TABLET 3 TIMES A DAY. (Patient not taking: Reported on 09/15/2014) 90 tablet 0  . traMADol (ULTRAM) 50 MG tablet Take one tablet by mouth every 6 hours as needed for pain (Patient not taking: Reported on 09/15/2014) 120 tablet 5   Review Of Systems: 12 point ROS negative except as noted above in HPI.  Physical Exam: Filed Vitals:   09/16/14 0045  BP: 135/76  Pulse: 45  Temp:   Resp: 17    General: alert and cooperative HEENT: PERRLA and extra ocular movement intact Heart: S1, S2 normal, no murmur, rub or gallop, regular rate and rhythm Lungs: clear to auscultation, no wheezes or rales and unlabored breathing Abdomen: abdomen is soft without significant tenderness, masses, organomegaly or guarding Extremities: extremities normal, atraumatic, no cyanosis or edema Skin:no rashes Neurology: normal without focal findings  Labs and Imaging: Lab Results  Component Value Date/Time   NA 137 09/15/2014 09:10 PM   K 4.8 09/15/2014 09:10 PM   CL 101 09/15/2014 09:10 PM   CO2 28 09/15/2014 09:10 PM   BUN 20 09/15/2014 09:10 PM   CREATININE 1.10 09/15/2014 09:10 PM   CREATININE 1.72* 06/15/2014 04:54 PM   GLUCOSE 98 09/15/2014 09:10 PM   Lab Results  Component Value Date   WBC 7.8 09/15/2014   HGB 14.2 09/15/2014   HCT 43.3 09/15/2014   MCV 92.9 09/15/2014   PLT 236 09/15/2014    Ct Head Wo Contrast  09/15/2014   CLINICAL DATA:  Syncopal episode.  Numbness in the left face.  EXAM: CT HEAD WITHOUT CONTRAST  TECHNIQUE: Contiguous axial images were obtained from the base of the skull through the vertex without intravenous contrast.  COMPARISON:  05/15/2014  FINDINGS: There is  no evidence of mass effect, midline shift, or extra-axial fluid collections. There is no evidence of a space-occupying lesion or intracranial hemorrhage.  There is no evidence of a cortical-based area of acute infarction. There is generalized cerebral atrophy. There is periventricular white matter low attenuation likely secondary to microangiopathy.  The ventricles and sulci are appropriate for the patient's age. The basal cisterns are patent.  Visualized portions of the orbits are unremarkable. The visualized portions of the paranasal sinuses and mastoid air cells are unremarkable. Cerebrovascular atherosclerotic calcifications are noted.  The osseous structures are unremarkable.  IMPRESSION: 1. No acute intracranial pathology. 2. Chronic microvascular disease and cerebral atrophy.   Electronically Signed   By: Kathreen Devoid   On: 09/15/2014 20:58           Shanda Howells MD  Pager: [phone]

## 2014-09-16 NOTE — ED Notes (Signed)
Per chart swallow screen completed at 0217.

## 2014-09-16 NOTE — ED Notes (Signed)
pts sheets changed

## 2014-09-16 NOTE — ED Notes (Signed)
Per chart swallow screen completed at 0217.  

## 2014-09-16 NOTE — Progress Notes (Signed)
*  PRELIMINARY RESULTS* Vascular Ultrasound Carotid Duplex (Doppler) has been completed.   Study was technically limited and difficult due to poor patient cooperation. Findings suggest 1-39% internal carotid artery stenosis bilaterally. Vertebral arteries are patent with antegrade flow.  09/16/2014 3:57 PM Maudry Mayhew, RVT, RDCS, RDMS

## 2014-09-16 NOTE — Progress Notes (Signed)
Heather Stevenson for Coumadin Indication: atrial fibrillation  Allergies  Allergen Reactions  . Hctz [Hydrochlorothiazide]     unknown  . Sulfa Antibiotics     unknown  . Tetracyclines & Related     unknown   Labs:  Recent Labs  09/15/14 2110 09/16/14 0143 09/16/14 0845  HGB 14.2 14.5  --   HCT 43.3 43.3  --   PLT 236 224  --   LABPROT 21.0*  --  19.5*  INR 1.79*  --  1.63*  CREATININE 1.10 1.03  --     Estimated Creatinine Clearance: 46.9 mL/min (by C-G formula based on Cr of 1.03).   Assessment: 81 yo female admitted with bradycardia/near syncope, h/o Afib, to continue Coumadin INR low on admission and continues to trend down  Goal of Therapy:  INR 2-3 Monitor platelets by anticoagulation protocol: Yes   Plan:  Repeat Coumadin 5 mg po x 1 Daily INR  Thank you. Anette Guarneri, PharmD [phone]  09/16/2014,9:40 AM

## 2014-09-16 NOTE — Progress Notes (Signed)
Pt. arrived to unit alert and oriented x4. Oriented to room. Tele started- central monitoring notified.  Will continue to monitor. Bobbye Charleston, RN

## 2014-09-16 NOTE — ED Notes (Signed)
Patient transported to MRI

## 2014-09-16 NOTE — ED Notes (Addendum)
Went to MRI to administer Ativan to pt. Found pt moving from side to side in bed, picking at her clothing, hair, and skin. Pt was scratching at her skin stating that "I think its the tape that made me itchy". MRI staff stated that they had attempted to secure the pts legs to prevent her from moving, but found that it was unsuccessful. Pt repeatedly stated that "I don't want it done, I don't want the MRI today". This nurse did find that the pt was altered, this was not consistent with earlier findings. While in MRI pt stated that she was at the hospital "for my back, it's broke". When pt returned to room this nurse again asked the pt why she was at the hospital and the pt responded appropriately and was able to correctly answer all questions.

## 2014-09-17 DIAGNOSIS — K219 Gastro-esophageal reflux disease without esophagitis: Secondary | ICD-10-CM

## 2014-09-17 DIAGNOSIS — I10 Essential (primary) hypertension: Secondary | ICD-10-CM

## 2014-09-17 DIAGNOSIS — G459 Transient cerebral ischemic attack, unspecified: Secondary | ICD-10-CM

## 2014-09-17 DIAGNOSIS — I482 Chronic atrial fibrillation: Secondary | ICD-10-CM

## 2014-09-17 DIAGNOSIS — E785 Hyperlipidemia, unspecified: Secondary | ICD-10-CM

## 2014-09-17 LAB — BASIC METABOLIC PANEL
Anion gap: 9 (ref 5–15)
BUN: 18 mg/dL (ref 6–23)
CHLORIDE: 102 mmol/L (ref 96–112)
CO2: 26 mmol/L (ref 19–32)
Calcium: 9.1 mg/dL (ref 8.4–10.5)
Creatinine, Ser: 1.11 mg/dL — ABNORMAL HIGH (ref 0.50–1.10)
GFR calc non Af Amer: 45 mL/min — ABNORMAL LOW (ref >90)
GFR, EST AFRICAN AMERICAN: 52 mL/min — AB (ref >90)
Glucose, Bld: 105 mg/dL — ABNORMAL HIGH (ref 70–99)
Potassium: 3.5 mmol/L (ref 3.5–5.1)
Sodium: 137 mmol/L (ref 135–145)

## 2014-09-17 LAB — CBC
HCT: 39.1 % (ref 36.0–46.0)
HEMOGLOBIN: 13.1 g/dL (ref 12.0–15.0)
MCH: 31.4 pg (ref 26.0–34.0)
MCHC: 33.5 g/dL (ref 30.0–36.0)
MCV: 93.8 fL (ref 78.0–100.0)
Platelets: 222 10*3/uL (ref 150–400)
RBC: 4.17 MIL/uL (ref 3.87–5.11)
RDW: 15.2 % (ref 11.5–15.5)
WBC: 10.2 10*3/uL (ref 4.0–10.5)

## 2014-09-17 LAB — VITAMIN B12: Vitamin B-12: 546 pg/mL (ref 211–911)

## 2014-09-17 LAB — PROTIME-INR
INR: 1.93 — AB (ref 0.00–1.49)
PROTHROMBIN TIME: 22.3 s — AB (ref 11.6–15.2)

## 2014-09-17 LAB — HEMOGLOBIN A1C
HEMOGLOBIN A1C: 6.4 % — AB (ref 4.8–5.6)
Mean Plasma Glucose: 137 mg/dL

## 2014-09-17 MED ORDER — IPRATROPIUM-ALBUTEROL 0.5-2.5 (3) MG/3ML IN SOLN: 3.0000 mL | Freq: Once | RESPIRATORY_TRACT | Status: DC

## 2014-09-17 MED ORDER — BUDESONIDE 0.25 MG/2ML IN SUSP
0.2500 mg | Freq: Two times a day (BID) | RESPIRATORY_TRACT | Status: DC
  Administered 2014-09-17 – 2014-09-18 (×2): 0.25 mg via RESPIRATORY_TRACT
  Filled 2014-09-17: qty 2

## 2014-09-17 MED ORDER — WARFARIN SODIUM 5 MG PO TABS
2.5000 mg | ORAL_TABLET | Freq: Once | ORAL | Status: AC
  Administered 2014-09-18: 2.5 mg via ORAL
  Filled 2014-09-17: qty 0.5

## 2014-09-17 NOTE — Progress Notes (Signed)
[facility] PROGRESS NOTE  Heather Stevenson HWY:[id number] DOB: 1930-05-15 DOA: 09/15/2014 PCP: Alesia Richards, MD  Assessment/Plan: 1-left sided facial numbness: Symptoms now resolved without focal deficit -CT head negative for acute intracranial abnormality -MRI unable to be obtained since patient was unable to stay still for procedure -continue coumadin for secondary prevention -Probably have decided not to pursue it any further CT or MRI at this moment -Still mild confusion/disorientation since the patient received Ativan on the day of admission (most likely benefit from benzodiazepine use). -Patient is currently requiring assistance with mobility and activities of daily living as evaluated by PT/OT. -We will arrange for short-term rehabilitation to a skilled nursing facility at discharge  2-bradycardia in a patient with atrial fibrillation:  -Diltiazem and Lopressor dose has been adjusted and patient heart rate is now 60 to 70s -No RVR or any other abnormal rhythm has been capture on telemetry -We will monitor  3-hypertension: Stable and well controlled. -Will continue current antihypertensive regimen  4-grade 2 diastolic dysfunction as seen on 2-D echo: Chronic and compensated -Daily weights, low sodium diet, strict intake and output  -continue control of blood pressure  5-tobacco abuse: Cessation counseling has been provided -Mild wheezing on physical exam patient home is not taking any inhalers -Will use Pulmicort   6-depression: Continue Celexa  7-GERD: Continue PPI  8-hyperlipidemia: Continue Crestor  9-acute delirium: appears to be secondary to use of benzodiazepine  -will avoid the use of benzodiazepines -PRN Haldol for agitation -sitter for safety -patient with underlying dementia; will benefit of outpatient evaluation and initiation of treatment if needed and decided by PCP/Family  Code Statusbll code Family Communication: No family at  bedside Disposition Plan: SNF at discharge  Consultants:  None  Procedures:  2-D echo: - Left ventricle: The cavity size was normal. There was mild concentric hypertrophy. Systolic function was normal. The estimated ejection fraction was in the range of 60% to 65%. Wall motion was normal; there were no regional wall motion abnormalities. Features are consistent with a pseudonormal left ventricular filling pattern, with concomitant abnormal relaxation and increased filling pressure (grade 2 diastolic dysfunction). Doppler parameters are consistent with high ventricular filling pressure. - Aortic valve: Moderate thickening and calcification, consistent with sclerosis. - Mitral valve: The tip of the anterior mitral valve leaflet is moderately calcified. There is heavy focal mitral annular calcification and the posterior leaflet appears fixed. There was mild regurgitation. - Left atrium: The atrium was moderately dilated. - Right ventricle: The cavity size was mildly dilated. Wall thickness was normal. - Pulmonic valve: There was trivial regurgitation. - Pulmonary arteries: PA peak pressure: 47 mm Hg (S).  Impressions: - The right ventricular systolic pressure was increased consistent with moderate pulmonary hypertension.   Carotid duplex: Findings suggest 1-39% internal carotid artery stenosis bilaterally. Vertebral arteries are patent with antegrade flow.  Antibiotics:  None  HPI/Subjective: Patient is more alert and oriented, denies any chest pain, shortness of breath or abdominal pain. There is no appreciated focal deficit on neurologic exam.  Objective: Filed Vitals:   09/17/14 1300  BP: 117/86  Pulse: 69  Temp: 98.8 F (37.1 C)  Resp: 18    Intake/Output Summary (Last 24 hours) at 09/17/14 1730 Last data filed at 09/17/14 0908  Gross per 24 hour  Intake    240 ml  Output    125 ml  Net    115 ml   Filed Weights   09/16/14  0452  Weight: 87.136 kg (192 lb 1.6 oz)  Exam:   General:  Alert, awake and oriented X2; no major complaints. Denies CP and SOB. Patient is disoriented to situation and reason for admission.  Cardiovascular: Regular rate, no rubs, no gallops, no murmurs; no JVD  Respiratory:  scattered rhonchi and mild wheezing; good air movement   Abdomen: soft, nontender, nondistended   Musculoskeletal:  no joint swelling, no cyanosis or clubbing  Data Reviewed: Basic Metabolic Panel:  Recent Labs Lab 09/15/14 2110 09/16/14 0143 09/17/14 0825  NA 137  --  137  K 4.8  --  3.5  CL 101  --  102  CO2 28  --  26  GLUCOSE 98  --  105*  BUN 20  --  18  CREATININE 1.10 1.03 1.11*  CALCIUM 9.2  --  9.1  MG 2.1  --   --    CBC:  Recent Labs Lab 09/15/14 2110 09/16/14 0143 09/17/14 0825  WBC 7.8 7.1 10.2  NEUTROABS 4.5  --   --   HGB 14.2 14.5 13.1  HCT 43.3 43.3 39.1  MCV 92.9 92.5 93.8  PLT 236 224 222   ProBNP (last 3 results)  Recent Labs  12/04/13 1428  PROBNP 10429.0*    CBG:  Recent Labs Lab 09/15/14 2202  GLUCAP 85    Studies: Dg Chest 2 View  09/16/2014   CLINICAL DATA:  Stroke.  EXAM: CHEST  2 VIEW  COMPARISON:  05/18/2014  FINDINGS: Cardiac enlargement. No pulmonary vascular congestion. No focal airspace disease or consolidation in the lungs. No blunting of costophrenic angles. No pneumothorax. Thoracolumbar scoliosis convex towards the right. Tortuous and calcified aorta. Degenerative changes in the spine and shoulders.  IMPRESSION: Cardiac enlargement.  No evidence of active pulmonary disease.   Electronically Signed   By: Lucienne Capers M.D.   On: 09/16/2014 01:58   Ct Head Wo Contrast  09/15/2014   CLINICAL DATA:  Syncopal episode.  Numbness in the left face.  EXAM: CT HEAD WITHOUT CONTRAST  TECHNIQUE: Contiguous axial images were obtained from the base of the skull through the vertex without intravenous contrast.  COMPARISON:  05/15/2014  FINDINGS: There  is no evidence of mass effect, midline shift, or extra-axial fluid collections. There is no evidence of a space-occupying lesion or intracranial hemorrhage. There is no evidence of a cortical-based area of acute infarction. There is generalized cerebral atrophy. There is periventricular white matter low attenuation likely secondary to microangiopathy.  The ventricles and sulci are appropriate for the patient's age. The basal cisterns are patent.  Visualized portions of the orbits are unremarkable. The visualized portions of the paranasal sinuses and mastoid air cells are unremarkable. Cerebrovascular atherosclerotic calcifications are noted.  The osseous structures are unremarkable.  IMPRESSION: 1. No acute intracranial pathology. 2. Chronic microvascular disease and cerebral atrophy.   Electronically Signed   By: Kathreen Devoid   On: 09/15/2014 20:58    Scheduled Meds: .  stroke: mapping our early stages of recovery book   Does not apply Once  . aspirin EC  81 mg Oral Daily  . citalopram  20 mg Oral Daily  . diltiazem  180 mg Oral Daily  . enoxaparin (LOVENOX) injection  30 mg Subcutaneous Daily  . ipratropium-albuterol  3 mL Nebulization Once  . magnesium oxide  400 mg Oral Daily  . metoprolol tartrate  12.5 mg Oral BID  . pantoprazole  40 mg Oral Daily  . rosuvastatin  5 mg Oral Daily  . warfarin  2.5 mg Oral  ONCE-1800  . Warfarin - Pharmacist Dosing Inpatient   Does not apply q1800   Continuous Infusions:   Active Problems:   Left facial numbness   Bradycardia    Time spent: 30 minutes    Barton Dubois  [facility] Pager [phone]. If 7PM-7AM, please contact night-coverage at [email or web address], password [facility] 09/17/2014, 5:30 PM

## 2014-09-17 NOTE — Clinical Social Work Placement (Addendum)
Clinical Social Work Department CLINICAL SOCIAL WORK PLACEMENT NOTE 09/17/2014  Patient:  MAJESTA, LEICHTER  Account Number:  [id number] Admit date:  09/15/2014  Clinical Social Worker:  Greta Doom, LCSWA  Date/time:  09/17/2014 03:13 PM  Clinical Social Work is seeking post-discharge placement for this patient at the following level of care:   SKILLED NURSING   (*CSW will update this form in Epic as items are completed)   09/17/2014  Patient/family provided with [place] Department of Clinical Social Work's list of facilities offering this level of care within the geographic area requested by the patient (or if unable, by the patient's family).  09/17/2014  Patient/family informed of their freedom to choose among providers that offer the needed level of care, that participate in Medicare, Medicaid or managed care program needed by the patient, have an available bed and are willing to accept the patient.  09/17/2014  Patient/family informed of [facility]' ownership interest in [facility], as well as of the fact that they are under no obligation to receive care at this facility.  PASARR submitted to EDS on 09/17/2014 PASARR number received on 09/17/2014  FL2 transmitted to all facilities in geographic area requested by pt/family on  09/17/2014 FL2 transmitted to all facilities within larger geographic area on 09/17/2014  Patient informed that his/her managed care company has contracts with or will negotiate with  certain facilities, including the following:     Patient/family informed of bed offers received: 09/18/2014  Patient chooses bed at [facility]  Physician recommends and patient chooses bed at    Patient to be transferred to [facility] on 09/18/2014  Patient to be transferred to facility by [facility]  Patient and family notified of transfer on 09/18/2014 Name of family member notified:  Wynelle Bourgeois  The following physician request were entered in  Epic:   Additional Comments:   [place], MSW, [place]

## 2014-09-17 NOTE — Clinical Social Work Psychosocial (Signed)
Clinical Social Work Department BRIEF PSYCHOSOCIAL ASSESSMENT 09/17/2014  Patient:  Heather Stevenson, Heather Stevenson     Account Number:  [id number]     Admit date:  09/15/2014  Clinical Social Worker:  Marciano Sequin  Date/Time:  09/17/2014 03:04 PM  Referred by:  RN  Date Referred:  09/17/2014 Referred for  SNF Placement   Other Referral:   Interview type:  Family Other interview type:   Pt is disoriented; pt daughter Wynelle Bourgeois [phone]    PSYCHOSOCIAL DATA Living Status:  ALONE Admitted from facility:   Level of care:   Primary support name:  Wynelle Bourgeois Primary support relationship to patient:  CHILD, ADULT Degree of support available:   Strong Support System    CURRENT CONCERNS Current Concerns  Post-Acute Placement   Other Concerns:    SOCIAL WORK ASSESSMENT / PLAN CSW met the pt's daughter Joni at the bedside.  CSW introduced self and purpose of the visit. CSW discussed clinical recommendation for SNF rehab. CSW inquired about the geographical location in which Joni would like the pt to receive rehab from. Joni reported being unsure. CSW explained the SNF rehab process to [facility]. CSW and Joni discussed insurance and its relation to SNF rehab. CSW answered all questions in which the Joni inquired about. CSW provided Joni with contact information for further questions. CSW will continue to follow this pt and assist with discharge as needed.   Assessment/plan status:  Psychosocial Support/Ongoing Assessment of Needs Other assessment/ plan:   Information/referral to community resources:    PATIENT'S/FAMILY'S RESPONSE TO CURRENT DIAGNOSE: [name] reported being confused regarding the pt current diagnose. Joni explained in detail the pt level of functioning prior to being admitted. Joni repeatedly stated the pt was independent living along.      PATIENT'S/FAMILY'S RESPONSE TO PLAN OF CARE: [name] is very confused to what level of care she would like for the pt.   [place], MSW, [place]

## 2014-09-17 NOTE — Evaluation (Signed)
Physical Therapy Evaluation Patient Details Name: Heather Stevenson MRN: [id number] DOB: 1930/07/06 Today's Date: 09/17/2014   History of Present Illness  Patient is a 81 y/o female with PMH of Atrial fibrillation, systolic CHF, COPD, tobacco abuse and stage IV CKD presenting with weakness, L sided facial numbness and bradycardia. Pt s/p fall in hospital. CT- unremarkable. Unable to perform MRI as pt will not stay still. Workup pending.    Clinical Impression  Patient presents with generalized weakness, balance deficits, and possible cognitive deficits? Impacting safe mobility. Requires Min-Mod A for mobility. Retropulsion noted with standing. Pt with multiple falls at home. Seems lethargic today. Per sitter, pt did not get much sleep. Pt high falls risk. Pt would benefit from skilled PT to improve transfers, gait, balance and mobility so pt can maximize independence and ease burden of care prior to return home. Pt agreeable to ST SNF.    Follow Up Recommendations SNF;Supervision/Assistance - 24 hour    Equipment Recommendations  None recommended by PT    Recommendations for Other Services       Precautions / Restrictions Precautions Precautions: Fall Restrictions Weight Bearing Restrictions: No      Mobility  Bed Mobility Overal bed mobility: Needs Assistance Bed Mobility: Supine to Sit;Sit to Supine     Supine to sit: Min guard;HOB elevated Sit to supine: Min guard   General bed mobility comments: use of rails for support. Increased time.  Transfers Overall transfer level: Needs assistance Equipment used: Rolling walker (2 wheeled) Transfers: Sit to/from Stand Sit to Stand: Mod assist         General transfer comment: Mod A to rise from EOB x2. Retropulsion upon standing. Cues for anterior lean and shifting weight on UEs.   Ambulation/Gait Ambulation/Gait assistance: Min assist Ambulation Distance (Feet): 28 Feet Assistive device: Rolling walker (2 wheeled) Gait  Pattern/deviations: Step-through pattern;Decreased stride length;Scissoring;Narrow base of support   Gait velocity interpretation: Below normal speed for age/gender General Gait Details: Pt with very unsteady gait with right lateral lean and difficulty negotiating RW - running into things. Scrissoring gait pattern noted at times. Fatigues easily.  Stairs            Wheelchair Mobility    Modified Rankin (Stroke Patients Only)       Balance Overall balance assessment: Needs assistance;History of Falls Sitting-balance support: Feet supported;No upper extremity supported Sitting balance-Leahy Scale: Fair     Standing balance support: During functional activity Standing balance-Leahy Scale: Poor Standing balance comment: Relient on RW.                             Pertinent Vitals/Pain Pain Assessment: No/denies pain    Home Living Family/patient expects to be discharged to:: Private residence Living Arrangements: Alone Available Help at Discharge: Family;Available PRN/intermittently (Pt's daughter assists with IADls.) Type of Home: Apartment Home Access: Elevator     Home Layout: One level Home Equipment: Walker - 4 wheels;Shower seat - built in      Prior Function Level of Independence: Independent with assistive device(s)         Comments: rollator; reports multiple falls at home.     Hand Dominance        Extremity/Trunk Assessment   Upper Extremity Assessment: Defer to OT evaluation           Lower Extremity Assessment: Generalized weakness         Communication   Communication: No difficulties  Cognition Arousal/Alertness: Lethargic Behavior During Therapy: WFL for tasks assessed/performed Overall Cognitive Status: Difficult to assess (A&O x4.)                      General Comments      Exercises        Assessment/Plan    PT Assessment Patient needs continued PT services  PT Diagnosis Difficulty  walking;Generalized weakness   PT Problem List Decreased strength;Decreased cognition;Decreased activity tolerance;Decreased balance;Decreased mobility;Decreased safety awareness  PT Treatment Interventions Balance training;Gait training;Patient/family education;Functional mobility training;Therapeutic activities;Therapeutic exercise   PT Goals (Current goals can be found in the Care Plan section) Acute Rehab PT Goals Patient Stated Goal: none stated PT Goal Formulation: With patient Time For Goal Achievement: 10/01/14 Potential to Achieve Goals: Fair    Frequency Min 3X/week   Barriers to discharge Decreased caregiver support Pt lives alone.    Co-evaluation               End of Session Equipment Utilized During Treatment: Gait belt Activity Tolerance: Patient limited by fatigue;Patient limited by lethargy Patient left: in bed;with call bell/phone within reach;with bed alarm set;with nursing/sitter in room Nurse Communication: Mobility status    Functional Assessment Tool Used: Clinical judgment Functional Limitation: Mobility: Walking and moving around Mobility: Walking and Moving Around Current Status (O6002): At least 40 percent but less than 60 percent impaired, limited or restricted Mobility: Walking and Moving Around Goal Status [phone]): At least 1 percent but less than 20 percent impaired, limited or restricted    Time: 1115-1131 PT Time Calculation (min) (ACUTE ONLY): 16 min   Charges:   PT Evaluation $Initial PT Evaluation Tier I: 1 Procedure     PT G Codes:   PT G-Codes **NOT FOR INPATIENT CLASS** Functional Assessment Tool Used: Clinical judgment Functional Limitation: Mobility: Walking and moving around Mobility: Walking and Moving Around Current Status (G8569): At least 40 percent but less than 60 percent impaired, limited or restricted Mobility: Walking and Moving Around Goal Status [phone]): At least 1 percent but less than 20 percent impaired, limited  or restricted    Candy Sledge A 09/17/2014, 11:40 AM Candy Sledge, PT, DPT [phone]

## 2014-09-17 NOTE — Progress Notes (Signed)
UR completed.

## 2014-09-17 NOTE — Evaluation (Signed)
Occupational Therapy Evaluation Patient Details Name: Heather Stevenson MRN: [id number] DOB: Feb 05, 1930 Today's Date: 09/17/2014    History of Present Illness Patient is a 81 y/o female with PMH of Atrial fibrillation, systolic CHF, COPD, tobacco abuse and stage IV CKD presenting with weakness, L sided facial numbness and bradycardia. Pt s/p fall in hospital. CT- unremarkable. Unable to perform MRI as pt will not stay still. Workup pending.   Clinical Impression   PTA pt lived at home and was independent with use of rollator, but reports multiple falls at home. Pt reports that daughter assists with ADLs PRN, but unclear how much. Pt appears to have some ataxia or difficulty with motor planning and requires mod A to stand. Pt requires assist for LB ADLs and functional mobility and would benefit from ST Rehab at SNF to facilitate a return home at Mod I level with family support.     Follow Up Recommendations  SNF;Supervision/Assistance - 24 hour    Equipment Recommendations  Other (comment) (Defer to SNF)    Recommendations for Other Services       Precautions / Restrictions Precautions Precautions: Fall Restrictions Weight Bearing Restrictions: No      Mobility Bed Mobility Overal bed mobility: Needs Assistance Bed Mobility: Supine to Sit     Supine to sit: Min assist Sit to supine: Min guard   General bed mobility comments: Use of rails. Pt required assist to elevate trunk.  Transfers Overall transfer level: Needs assistance Equipment used: Rolling walker (2 wheeled) Transfers: Sit to/from Stand Sit to Stand: Mod assist         General transfer comment: Mod A to rise from EOB. Retropulsion upon standing. Cues for anterior lean and shifting weight on UEs.     Balance Overall balance assessment: History of Falls Sitting-balance support: Feet supported;No upper extremity supported Sitting balance-Leahy Scale: Fair     Standing balance support: During functional  activity Standing balance-Leahy Scale: Poor Standing balance comment: Relient on RW.                            ADL Overall ADL's : Needs assistance/impaired Eating/Feeding: Independent;Sitting   Grooming: Set up;Sitting   Upper Body Bathing: Sitting;Set up;Supervision/ safety   Lower Body Bathing: Moderate assistance;Sit to/from stand   Upper Body Dressing : Minimal assistance;Sitting   Lower Body Dressing: Maximal assistance;Sit to/from stand   Toilet Transfer: Minimal assistance;Stand-pivot;RW Armed forces technical officer Details (indicate cue type and reason): bed>recliner                 Vision Additional Comments: Difficult to assess due to cognition          Pertinent Vitals/Pain Pain Assessment: Faces Faces Pain Scale: Hurts little more Pain Location: "leg" Pain Intervention(s): Limited activity within patient's tolerance;Monitored during session;Repositioned     Hand Dominance Right   Extremity/Trunk Assessment Upper Extremity Assessment Upper Extremity Assessment: Overall WFL for tasks assessed   Lower Extremity Assessment Lower Extremity Assessment: Generalized weakness   Cervical / Trunk Assessment Cervical / Trunk Assessment: Normal   Communication Communication Communication: No difficulties   Cognition Arousal/Alertness: Lethargic Behavior During Therapy: WFL for tasks assessed/performed;Restless Overall Cognitive Status: No family/caregiver present to determine baseline cognitive functioning Area of Impairment: Orientation;Attention;Memory;Following commands;Safety/judgement;Awareness;Problem solving Orientation Level: Disoriented to;Time;Situation Current Attention Level: Focused Memory: Decreased short-term memory Following Commands: Follows one step commands inconsistently;Follows one step commands with increased time Safety/Judgement: Decreased awareness of safety;Decreased awareness of deficits Awareness:  Intellectual Problem  Solving: Slow processing;Decreased initiation;Difficulty sequencing;Requires verbal cues                Home Living Family/patient expects to be discharged to:: Private residence Living Arrangements: Alone Available Help at Discharge: Family;Available PRN/intermittently (Pt's daughter assists with ADLs PRN ?) Type of Home: Apartment Home Access: Elevator     Home Layout: One level     Bathroom Shower/Tub: Occupational psychologist: Standard Bathroom Accessibility: Yes How Accessible: Accessible via walker Home Equipment: [place] - 4 wheels;Shower seat - built in   Additional Comments: pt lives in senior living complex      Prior Functioning/Environment Level of Independence: Independent with assistive device(s)        Comments: rollator; reports multiple falls at home.    OT Diagnosis: Generalized weakness;Cognitive deficits   OT Problem List: Decreased strength;Impaired balance (sitting and/or standing);Decreased activity tolerance;Decreased coordination;Decreased cognition;Decreased safety awareness;Decreased knowledge of use of DME or AE;Decreased knowledge of precautions   OT Treatment/Interventions: Self-care/ADL training;Therapeutic exercise;Neuromuscular education;Energy conservation;DME and/or AE instruction;Therapeutic activities;Cognitive remediation/compensation;Patient/family education;Balance training    OT Goals(Current goals can be found in the care plan section) Acute Rehab OT Goals Patient Stated Goal: to do what I'm supposed to OT Goal Formulation: With patient Time For Goal Achievement: 10/01/14 Potential to Achieve Goals: Good ADL Goals Pt Will Perform Grooming: standing;with supervision Pt Will Perform Lower Body Bathing: with set-up;with supervision;sit to/from stand Pt Will Perform Upper Body Dressing: sitting;with set-up Pt Will Perform Lower Body Dressing: with set-up;with supervision;sit to/from stand Pt Will Transfer to Toilet: with  supervision;ambulating Pt Will Perform Toileting - Clothing Manipulation and hygiene: with supervision;sit to/from stand  OT Frequency: Min 2X/week    End of Session Equipment Utilized During Treatment: Gait belt;Rolling walker  Activity Tolerance: Patient tolerated treatment well Patient left: in chair;with call bell/phone within reach;with chair alarm set;with nursing/sitter in room   Time: 1332-1350 OT Time Calculation (min): 18 min Charges:  OT General Charges $OT Visit: 1 Procedure OT Evaluation $Initial OT Evaluation Tier I: 1 Procedure G-Codes: OT G-codes **NOT FOR INPATIENT CLASS** Functional Assessment Tool Used: clinical judgement Functional Limitation: Self care Self Care Current Status (Q8250): At least 20 percent but less than 40 percent impaired, limited or restricted Self Care Goal Status (I3704): At least 1 percent but less than 20 percent impaired, limited or restricted  Juluis Rainier 09/17/2014, 2:09 PM  Cyndie Chime, OTR/L Occupational Therapist [phone] (pager)

## 2014-09-17 NOTE — Progress Notes (Signed)
[place] for Coumadin Indication: atrial fibrillation  Allergies  Allergen Reactions  . Hctz [Hydrochlorothiazide]     unknown  . Sulfa Antibiotics     unknown  . Tetracyclines & Related     unknown   Assessment: 81 yo female admitted with bradycardia/near syncope, h/o Afib, to continue Coumadin INR = 1.93  Home dose = 5 mg on Wednesdays, 2.5 mg other days  Goal of Therapy:  INR 2-3 Monitor platelets by anticoagulation protocol: Yes   Plan:  Coumadin 2.5 mg po x 1 Daily INR  Consider resuming her K supplement as at home (10 meq BID) Also on fenofibrate, allopurinol, singulair PTA -- Resume if appropriate?        Labs:  Recent Labs  09/15/14 2110 09/16/14 0143 09/16/14 0845 09/17/14 0825  HGB 14.2 14.5  --  13.1  HCT 43.3 43.3  --  39.1  PLT 236 224  --  222  LABPROT 21.0*  --  19.5* 22.3*  INR 1.79*  --  1.63* 1.93*  CREATININE 1.10 1.03  --  1.11*    Estimated Creatinine Clearance: 43.5 mL/min (by C-G formula based on Cr of 1.11).  Thank you. Anette Guarneri, PharmD [phone]  09/17/2014,10:28 AM

## 2014-09-17 NOTE — Evaluation (Signed)
Speech Language Pathology Evaluation Patient Details Name: Heather Stevenson MRN: [id number] DOB: 10/16/1929 Today's Date: 09/17/2014 Time: 6433-2951 SLP Time Calculation (min) (ACUTE ONLY): 13 min  Problem List:  Patient Active Problem List   Diagnosis Date Noted  . Left facial numbness 09/16/2014  . Bradycardia 09/16/2014  . Syncope, near 05/26/2014  . Chronic systolic CHF (congestive heart failure) 05/26/2014  . Atrial fibrillation, chronic 05/26/2014  . GERD (gastroesophageal reflux disease) 05/26/2014  . Orthostatic hypotension 05/17/2014  . T12 compression fracture 05/15/2014  . Back pain 05/15/2014  . Controlled atrial fibrillation 05/15/2014  . Tobacco abuse 05/15/2014  . CKD (chronic kidney disease), stage IV 05/15/2014  . AAA (abdominal aortic aneurysm) 05/15/2014  . Thoracic compression fracture 05/15/2014  . Acute respiratory failure with hypoxia 12/05/2013  . Atrial fibrillation with RVR 12/04/2013  . ASHD w/Diastolic Heart Failure 88/41/6606  . CKD (chronic kidney disease) 12/04/2013  . T2_NIDDM w/Stage 4 CKD (GFR 28 ml/min) 10/02/2013  . Vitamin D deficiency 10/02/2013  . Medication management 10/02/2013  . Obesity (BMI 38.76) 10/02/2013  . Hypertension   . Hyperlipidemia   . Abdominal aortic aneurysm   . COPD (chronic obstructive pulmonary disease)   . History of small bowel obstruction   . Incisional hernia 02/01/2012   Past Medical History:  Past Medical History  Diagnosis Date  . Hyperlipidemia   . Gout   . History of small bowel obstruction   . COPD (chronic obstructive pulmonary disease)   . Substance abuse     Tobacco  . Depression   . Pulmonary hypertension   . Umbilical hernia   . Bronchitis   . Atrial fibrillation   . High cholesterol   . Pneumonia   . Arthritis   . Abdominal aortic aneurysm     followed by Dr Kellie Simmering- 4.8 cm per [place] 6/12- [facility]  . Complication of anesthesia     slow to wake up per pt- anesthesia record on chart  .  Hypertension   . Anxiety   . Atherosclerosis of aorta   . Systolic heart failure     EF 40-45%    Past Surgical History:  Past Surgical History  Procedure Laterality Date  . Small intestine surgery  05/2010  . Abdominal hysterectomy      Vaginal  . Hernia repair  05/2010    incarcerated Umbilical hernia  . Tonsillectomy    . Incisional hernia repair  02/21/2012    Procedure: LAPAROSCOPIC INCISIONAL HERNIA;  Surgeon: Harl , MD;  Location: [facility];  Service: General;  Laterality: N/A;   HPI:  Patient is an 81 year old female with past medical history of gastric fibrillation, systolic CHF, COPD, tobacco abuse, stage IV CKD presenting with weakness, left sided facial numbness, bradycardia . Patient reports progressive onset of generalized weakness as well as left-sided facial numbness on 09/16/14.  Symptoms persisted throughout the course of the day and worsenec which is when patient subsequently called EMS. She denies any true hemiparesis or confusion. She smokes at least one pack per day. CT of the head within normal limits. Patient states that left facial numbness resolved by the time of arrival into the hospital.    Assessment / Plan / Recommendation Clinical Impression   Orders recieved; cognitive-linguisitc evlaution completed.  Assessment limited due to brief periods of arousal and sustained attention.  SLP questions cause of decreased ability to sustain attention; true deficits versus medication and/or lack of sleep.  Currently due to disorientation and inability to  sustain attention patient is unsafe with all basic self-care tasks and requires a sitter for 24/7 supervision and safety.  Recommend skilled SLP services to address deficits as well as complete further diagnostic treatment of higher level abilities.    SLP Assessment  Patient needs continued Speech Lanaguage Pathology Services    Follow Up Recommendations  Other (comment) (TBD)    Frequency and Duration min  2x/week  1 week   Pertinent Vitals/Pain Pain Assessment: No/denies pain   SLP Goals  Patient/Family Stated Goal: none stated Potential to Achieve Goals (ACUTE ONLY): Good Potential Considerations (ACUTE ONLY): Ability to learn/carryover information  SLP Evaluation Prior Functioning  Cognitive/Linguistic Baseline: Information not available   Cognition  Overall Cognitive Status: Difficult to assess Arousal/Alertness: Lethargic Orientation Level: Oriented to person Attention: Sustained Sustained Attention: Impaired Sustained Attention Impairment: Verbal basic;Functional basic Problem Solving: Impaired Problem Solving Impairment: Verbal basic;Functional basic Safety/Judgment: Impaired Comments: question cause of decreased ability to sustain attention; true deficit versus medication and lack of sleep.  Currently due to inability to sustain attention patient unsafe with all basic self-care tasks and requires a sitter    Comprehension  Auditory Comprehension Overall Auditory Comprehension: Impaired Visual Recognition/Discrimination Discrimination: Not tested Reading Comprehension Reading Status: Not tested    Expression Expression Primary Mode of Expression: Verbal Verbal Expression Overall Verbal Expression: Appears within functional limits for tasks assessed Written Expression Written Expression: Not tested   Oral / Motor Oral Motor/Sensory Function Overall Oral Motor/Sensory Function:  (testing limited, but grossly WFL) Motor Speech Overall Motor Speech: Appears within functional limits for tasks assessed   GO Functional Assessment Tool Used: skilled clinical judgement Functional Limitations: Attention Attention Current Status (I1146): At least 80 percent but less than 100 percent impaired, limited or restricted Attention Goal Status (W3142): At least 1 percent but less than 20 percent impaired, limited or restricted   Heather Stevenson.,  CCC-SLP [phone]  [place] 09/17/2014, 11:03 AM

## 2014-09-18 DIAGNOSIS — G459 Transient cerebral ischemic attack, unspecified: Secondary | ICD-10-CM | POA: Insufficient documentation

## 2014-09-18 DIAGNOSIS — J4521 Mild intermittent asthma with (acute) exacerbation: Secondary | ICD-10-CM

## 2014-09-18 DIAGNOSIS — N183 Chronic kidney disease, stage 3 (moderate): Secondary | ICD-10-CM

## 2014-09-18 DIAGNOSIS — Z720 Tobacco use: Secondary | ICD-10-CM

## 2014-09-18 DIAGNOSIS — N189 Chronic kidney disease, unspecified: Secondary | ICD-10-CM | POA: Insufficient documentation

## 2014-09-18 LAB — PROTIME-INR
INR: 2.09 — ABNORMAL HIGH (ref 0.00–1.49)
PROTHROMBIN TIME: 23.7 s — AB (ref 11.6–15.2)

## 2014-09-18 MED ORDER — WARFARIN SODIUM 5 MG PO TABS
5.0000 mg | ORAL_TABLET | ORAL | Status: DC
Start: 2014-09-18 — End: 2014-09-18

## 2014-09-18 MED ORDER — ASPIRIN 81 MG PO TBEC: 81.0000 mg | DELAYED_RELEASE_TABLET | Freq: Every day | ORAL | Status: AC

## 2014-09-18 MED ORDER — DILTIAZEM HCL ER COATED BEADS 180 MG PO CP24: 180.0000 mg | ORAL_CAPSULE | Freq: Every day | ORAL | Status: DC

## 2014-09-18 MED ORDER — WARFARIN SODIUM 5 MG PO TABS: 2.5000 mg | ORAL_TABLET | ORAL | Status: DC

## 2014-09-18 MED ORDER — METOPROLOL SUCCINATE ER 50 MG PO TB24: 100.0000 mg | ORAL_TABLET | Freq: Every day | ORAL | Status: DC

## 2014-09-18 MED ORDER — FUROSEMIDE 20 MG PO TABS
20.0000 mg | ORAL_TABLET | Freq: Every day | ORAL | Status: DC
Start: 2014-09-18 — End: 2014-12-20

## 2014-09-18 NOTE — Progress Notes (Signed)
PT Cancellation Note  Patient Details Name: SOPHYA VANBLARCOM MRN: [id number] DOB: 04/01/1930   Cancelled Treatment:    Reason Eval/Treat Not Completed: Fatigue/lethargy limiting ability to participate. Patient declining PT this AM stating that she was tired and wanted to sleep. Attempted to educate on importance of PT at this time but patient began to fall asleep as PTA was talking. Will check back later as schedule allows   Jacqualyn Posey 09/18/2014, 9:14 AM

## 2014-09-18 NOTE — Progress Notes (Signed)
Physical Therapy Treatment Patient Details Name: Heather Stevenson MRN: [id number] DOB: 1930/01/07 Today's Date: 09/18/2014    History of Present Illness Patient is a 81 y/o female with PMH of Atrial fibrillation, systolic CHF, COPD, tobacco abuse and stage IV CKD presenting with weakness, L sided facial numbness and bradycardia. Pt s/p fall in hospital. CT- unremarkable. Unable to perform MRI as pt will not stay still. Workup pending.    PT Comments    Patient agreeable to walk after second attempt this session. Patient felt more comfortable with chair follow. Continues with some decreased safety with ambulation and use of RW. Patient continued to state "Im not crazy" when being asked orientation questions. Continue to recommend SNF for ongoing Physical Therapy.     Follow Up Recommendations  SNF;Supervision/Assistance - 24 hour     Equipment Recommendations  None recommended by PT    Recommendations for Other Services       Precautions / Restrictions Precautions Precautions: Fall Restrictions Weight Bearing Restrictions: No    Mobility  Bed Mobility Overal bed mobility: Needs Assistance Bed Mobility: Supine to Sit     Supine to sit: Min assist     General bed mobility comments: Use of rails. Pt required assist to elevate trunk.  Transfers Overall transfer level: Needs assistance Equipment used: Rolling walker (2 wheeled)   Sit to Stand: Mod assist         General transfer comment: Mod A to rise from EOB. Retropulsion upon standing. Cues for anterior lean and shifting weight on UEs. Stood from Psychologist, occupational with use of armrest with Min A for stability  Ambulation/Gait Ambulation/Gait assistance: Museum/gallery curator (Feet): 40 Feet (x2) Assistive device: Rolling walker (2 wheeled) Gait Pattern/deviations: Step-through pattern;Decreased stride length;Decreased dorsiflexion - left;Decreased weight shift to left;Narrow base of support   Gait velocity  interpretation: Below normal speed for age/gender General Gait Details: Pt with very unsteady gait with right lateral lean and difficulty negotiating RW still. patient with one seated rest break   Stairs            Wheelchair Mobility    Modified Rankin (Stroke Patients Only)       Balance                                    Cognition Arousal/Alertness: Awake/alert Behavior During Therapy: WFL for tasks assessed/performed Overall Cognitive Status: No family/caregiver present to determine baseline cognitive functioning   Orientation Level: Situation Current Attention Level: Sustained Memory: Decreased short-term memory Following Commands: Follows one step commands inconsistently Safety/Judgement: Decreased awareness of safety;Decreased awareness of deficits   Problem Solving: Slow processing;Decreased initiation;Difficulty sequencing;Requires verbal cues      Exercises      General Comments        Pertinent Vitals/Pain Pain Assessment: No/denies pain    Home Living                      Prior Function            PT Goals (current goals can now be found in the care plan section) Progress towards PT goals: Progressing toward goals    Frequency  Min 3X/week    PT Plan Current plan remains appropriate    Co-evaluation             End of Session Equipment Utilized During Treatment: Gait belt Activity Tolerance: Patient tolerated  treatment well;Patient limited by fatigue Patient left: in chair;with family/visitor present;with call bell/phone within reach     Time: 1115-1128 PT Time Calculation (min) (ACUTE ONLY): 13 min  Charges:  $Gait Training: 8-22 mins                    G Codes:      Jacqualyn Posey 09/18/2014, 12:37 PM 09/18/2014 Jacqualyn Posey PTA [phone] pager [phone] office

## 2014-09-18 NOTE — Progress Notes (Addendum)
[place] for Coumadin Indication: atrial fibrillation  Allergies  Allergen Reactions  . Hctz [Hydrochlorothiazide]     unknown  . Sulfa Antibiotics     unknown  . Tetracyclines & Related     unknown   Labs:  Recent Labs  09/15/14 2110 09/16/14 0143 09/16/14 0845 09/17/14 0825 09/18/14 0542  HGB 14.2 14.5  --  13.1  --   HCT 43.3 43.3  --  39.1  --   PLT 236 224  --  222  --   LABPROT 21.0*  --  19.5* 22.3* 23.7*  INR 1.79*  --  1.63* 1.93* 2.09*  CREATININE 1.10 1.03  --  1.11*  --     Estimated Creatinine Clearance: 43.1 mL/min (by C-G formula based on Cr of 1.11).  Assessment: 81 yo female admitted with bradycardia/near syncope, h/o Afib. Dose was given pretty late last PM. INR is therapeutic today. We'll try to put on schedule dose today.   Home dose = 5 mg on Wednesdays, 2.5 mg other days  Goal of Therapy:  INR 2-3 Monitor platelets by anticoagulation protocol: Yes   Plan:   Change coumadin to 2.34m PO qday except for 560mMWF Cont daily INR while here Dc lovenox  MiOnnie BoerPharmD Pager: [phone]/05/2015 8:21 AM

## 2014-09-18 NOTE — Progress Notes (Signed)
Speech Language Pathology Treatment: Cognitive-Linquistic  Patient Details Name: Heather Stevenson MRN: [id number] DOB: 04-23-1930 Today's Date: 09/18/2014 Time: 4497-5300 SLP Time Calculation (min) (ACUTE ONLY): 12 min  Assessment / Plan / Recommendation Clinical Impression  Skilled treatment session focused on addressing cognition goals.  Patient awake and alert today was adequate sustained attention to conversation; however, she required Mod question cues for orientation to date and situation.  Daughter present for session today and SLP educated on need/rationale for follow up SLP services at next level of care to maximize functional independence and overall safety; daughter in agreement.       HPI HPI: Patient is an 81 year old female with past medical history of gastric fibrillation, systolic CHF, COPD, tobacco abuse, stage IV CKD presenting with weakness, left sided facial numbness, bradycardia . Patient reports progressive onset of generalized weakness as well as left-sided facial numbness on 09/16/14.  Symptoms persisted throughout the course of the day and worsenec which is when patient subsequently called EMS. She denies any true hemiparesis or confusion. She smokes at least one pack per day. CT of the head within normal limits. Patient states that left facial numbness resolved by the time of arrival into the hospital.    Pertinent Vitals Pain Assessment: No/denies pain  SLP Plan  Continue with current plan of care    Recommendations                Oral Care Recommendations: Oral care BID Follow up Recommendations: 24 hour supervision/assistance;Skilled Nursing facility Plan: Continue with current plan of care    GO Functional Assessment Tool Used: skilled clinical judgement Functional Limitations: Attention Attention Current Status (F1102): At least 80 percent but less than 100 percent impaired, limited or restricted Attention Goal Status (T1173): At least 1 percent but less than  20 percent impaired, limited or restricted   [name], CCC-SLP [phone]  [place] 09/18/2014, 3:13 PM

## 2014-09-18 NOTE — Discharge Summary (Signed)
Physician Discharge Summary  Heather Stevenson GTX:[id number] DOB: 03/14/1930 DOA: 09/15/2014  PCP: Alesia Richards, MD  Admit date: 09/15/2014 Discharge date: 09/18/2014  Time spent: >30 minutes  Recommendations for Outpatient Follow-up:  Check coumadin level in 5 days (goal is INR between 2-3) Check CBC in 1 week to follow Hgb trend Avoid the use of benzodiazepine  Maintain good hydration Follow heart healthy diet (sodium intake < 2.5 gram daily) Recheck BMET in 1 week to follow electrolytes and renal function Please arrange outpatient PFTs Reassess patient's heart rate and adjust medications as needed  Discharge Diagnoses:  Left facial numbness TIA Bradycardia Essential hypertension Hyperlipidemia Dementia Depression GERD Tobacco abuse Asthma/COPD Atrial fibrillation on Coumadin  Discharge Condition: Stable and improved. Patient will be discharged to skilled nursing facility for rehabilitation and further care.  Diet recommendation: Heart healthy diet  Filed Weights   09/16/14 0452 09/17/14 2300  Weight: 87.136 kg (192 lb 1.6 oz) 85.276 kg (188 lb)    History of present illness:  81 y.o. year old female with significant past medical history of gastric fibrillation, systolic CHF, COPD, tobacco abuse, stage IV CKD presenting with weakness, L sided facial numbness, bradycardia . Patient reports progressive onset of generalized weakness as well as left-sided facial numbness approximately around 4 PM today. Symptoms persisted throughout the course of the day and daily worsens which is when patient subsequently called EMS. Denies any true hemiparesis or confusion. Tobaccoabuse.IsonCoumadinforA. Fib.Notchronicallychecked.Deniesanychestpainorshortnessofbreath. Presented to the ER afebrile, heart rate in 30s to 50s, respirations tens to 20s, blood pressure in the 120s to 160s over 90s to 100s. Satting 96% on room air. CBC and BMP within normal limits. INR 1.8. CT of the head within  normal limits.  Hospital Course:  1-left sided facial numbness: Symptoms now resolved without focal deficit -CT head negative for acute intracranial abnormality -MRI unable to be obtained since patient was unable to stay still for procedure -continue coumadin for secondary prevention at discharge INR was therapeutic;  -Family have decided not to pursue any further CT or MRI at this moment; will like to provide conservative management and physical therapy for rehabilitation  -Still mild confusion/disorientation since the patient received Ativan on the day of admission (most likely side effect from benzodiazepine use). Steadily improving  -Patient is currently requiring assistance with mobility and activities of daily living as evaluated by PT/OT. -Will arrange for short-term rehabilitation to a skilled nursing facility at dischargeTo continue providing care and increase patient strength and ability to perform her activities with minimal assistance.   2-bradycardia in a patient with atrial fibrillation:  -Diltiazem and Lopressor dose has been adjusted and patient heart rate is now stable in the range of 60 to 70s -No RVR or any other abnormal rhythm has been capture on telemetry  3-hypertension: Stable and well controlled. -Will continue current antihypertensive regimen and heart healthy diet  4-grade 2 diastolic dysfunction as seen on 2-D echo: Chronic and compensated -Daily weights, low sodium diet, strict intake and output  -continue control of blood pressure  5-tobacco abuse: Cessation counseling has been provided -Mild wheezing on physical exam patient home is not taking any inhalers -Pulmicort was used intermittently while in the hospital with good success    6-depression: Continue Celexa -Stable mood; no hallucinations or suicidal ideation  7-GERD: Continue PPI  8-hyperlipidemia: Continue Crestor  9-acute delirium: appears to be secondary to use of benzodiazepine  -will  recommend to avoid the use of benzodiazepines -patient with signs/symptoms of underlying dementia; will  benefit of outpatient formal evaluation and initiation of treatment if needed and decided by PCP/Family  10-asthma/COPD: Patient advised to quit smoking. At home was not using any inhalers. Continue the use of Singulair and patient will benefit off PFTs as an outpatient and initiation of maintenance inhaler/rescue therapy as needed. -Currently no wheezing, no shortness of breath  Procedures:  2-D echo: - Left ventricle: The cavity size was normal. There was mild concentric hypertrophy. Systolic function was normal. The estimated ejection fraction was in the range of 60% to 65%. Wall motion was normal; there were no regional wall motion abnormalities. Features are consistent with a pseudonormal left ventricular filling pattern, with concomitant abnormal relaxation and increased filling pressure (grade 2 diastolic dysfunction). Doppler parameters are consistent with high ventricular filling pressure. - Aortic valve: Moderate thickening and calcification, consistent with sclerosis. - Mitral valve: The tip of the anterior mitral valve leaflet is moderately calcified. There is heavy focal mitral annular calcification and the posterior leaflet appears fixed. There was mild regurgitation. - Left atrium: The atrium was moderately dilated. - Right ventricle: The cavity size was mildly dilated. Wall thickness was normal. - Pulmonic valve: There was trivial regurgitation. - Pulmonary arteries: PA peak pressure: 47 mm Hg (S).  Impressions: - The right ventricular systolic pressure was increased consistent with moderate pulmonary hypertension.   Carotid duplex: Findings suggest 1-39% internal carotid artery stenosis bilaterally. Vertebral arteries are patent with antegrade flow.  Consultations:  None  Discharge Exam: Filed Vitals:   09/18/14 0945  BP:  116/77  Pulse: 67  Temp: 98.4 F (36.9 C)  Resp: 20    General: Alert, awake and oriented X2; no major complaints. Denies CP and SOB.   Cardiovascular: Regular rate, no rubs, no gallops, no murmurs; no JVD  Respiratory: scattered rhonchi and mild wheezing; good air movement   Abdomen: soft, nontender, nondistended   Musculoskeletal: no joint swelling, no cyanosis or clubbing   Discharge Instructions   Discharge Instructions    Diet - low sodium heart healthy    Complete by:  As directed      Discharge instructions    Complete by:  As directed   Check coumadin level in 5 days (goal is INR between 2-3) Check CBC in 1 week to follow Hgb trend Avoid the use of benzodiazepine  Maintain good hydration Follow heart healthy diet (sodium intake < 2.5 gram daily) Recheck BMET in 1 week to follow electrolytes and renal function          Current Discharge Medication List    START taking these medications   Details  aspirin EC 81 MG EC tablet Take 1 tablet (81 mg total) by mouth daily.      CONTINUE these medications which have CHANGED   Details  diltiazem (CARDIZEM CD) 180 MG 24 hr capsule Take 1 capsule (180 mg total) by mouth daily.    furosemide (LASIX) 20 MG tablet Take 1 tablet (20 mg total) by mouth daily. Qty: 90 tablet, Refills: 0    metoprolol succinate (TOPROL-XL) 50 MG 24 hr tablet Take 2 tablets (100 mg total) by mouth daily. Take with or immediately following a meal.      CONTINUE these medications which have NOT CHANGED   Details  acetaminophen (TYLENOL) 500 MG tablet Take 500 mg by mouth every 6 (six) hours as needed for moderate pain.    allopurinol (ZYLOPRIM) 300 MG tablet TAKE 1 TABLET ONCE DAILY. FOR GOUT Qty: 90 tablet, Refills: 1  Cholecalciferol ([employer]S VIT D 5000 HIGH-POTENCY PO) Take 5,000 Units by mouth daily.     citalopram (CELEXA) 20 MG tablet Take 1 tablet (20 mg total) by mouth daily. Qty: 90 tablet, Refills: 1    dexlansoprazole  (DEXILANT) 60 MG capsule Take 1 capsule (60 mg total) by mouth daily. Qty: 30 capsule, Refills: 3    docusate sodium 100 MG CAPS Take 100 mg by mouth 2 (two) times daily. Qty: 60 capsule, Refills: 0    fenofibrate micronized (LOFIBRA) 134 MG capsule TAKE 1 CAPSULE ONCE DAILY FOR BLOOD FATS. Qty: 30 capsule, Refills: 0    Magnesium 250 MG TABS Take 250 mg by mouth daily.    montelukast (SINGULAIR) 10 MG tablet Take 1 tablet (10 mg total) by mouth at bedtime. Qty: 30 tablet, Refills: 3    Multiple Vitamins-Minerals (CENTRUM) tablet Take 1 tablet by mouth daily.     potassium chloride (K-DUR) 10 MEQ tablet Take 1 tablet (10 mEq total) by mouth 2 (two) times daily. Qty: 60 tablet, Refills: 2    rosuvastatin (CRESTOR) 5 MG tablet Take 5 mg by mouth daily.    warfarin (COUMADIN) 5 MG tablet Take 2.5-5 mg by mouth See admin instructions. Takes 2.17m half of the 5710m All days except on Wednesday take a whole 10m66mab      STOP taking these medications     traMADol (ULTRAM) 50 MG tablet        Allergies  Allergen Reactions  . Hctz [Hydrochlorothiazide]     unknown  . Sulfa Antibiotics     unknown  . Tetracyclines & Related     unknown   Follow-up Information    Follow up with MCKAlesia RichardsD.   Specialty:  Internal Medicine   Why:  2 weeks after discharge form SNF   Contact information:   [id number]    The results of significant diagnostics from this hospitalization (including imaging, microbiology, ancillary and laboratory) are listed below for reference.    Significant Diagnostic Studies: Dg Chest 2 View  09/16/2014   CLINICAL DATA:  Stroke.  EXAM: CHEST  2 VIEW  COMPARISON:  05/18/2014  FINDINGS: Cardiac enlargement. No pulmonary vascular congestion. No focal airspace disease or consolidation in the lungs. No blunting of costophrenic angles. No pneumothorax. Thoracolumbar scoliosis convex towards the  right. Tortuous and calcified aorta. Degenerative changes in the spine and shoulders.  IMPRESSION: Cardiac enlargement.  No evidence of active pulmonary disease.   Electronically Signed   By: WilLucienne CapersD.   On: 09/16/2014 01:58   Ct Head Wo Contrast  09/15/2014   CLINICAL DATA:  Syncopal episode.  Numbness in the left face.  EXAM: CT HEAD WITHOUT CONTRAST  TECHNIQUE: Contiguous axial images were obtained from the base of the skull through the vertex without intravenous contrast.  COMPARISON:  05/15/2014  FINDINGS: There is no evidence of mass effect, midline shift, or extra-axial fluid collections. There is no evidence of a space-occupying lesion or intracranial hemorrhage. There is no evidence of a cortical-based area of acute infarction. There is generalized cerebral atrophy. There is periventricular white matter low attenuation likely secondary to microangiopathy.  The ventricles and sulci are appropriate for the patient's age. The basal cisterns are patent.  Visualized portions of the orbits are unremarkable. The visualized portions of the paranasal sinuses and mastoid air cells are unremarkable. Cerebrovascular atherosclerotic calcifications are noted.  The osseous structures are unremarkable.  IMPRESSION: 1. No acute intracranial pathology. 2. Chronic microvascular disease and cerebral atrophy.   Electronically Signed   By: Kathreen Devoid   On: 09/15/2014 20:58   Labs: Basic Metabolic Panel:  Recent Labs Lab 09/15/14 2110 09/16/14 0143 09/17/14 0825  NA 137  --  137  K 4.8  --  3.5  CL 101  --  102  CO2 28  --  26  GLUCOSE 98  --  105*  BUN 20  --  18  CREATININE 1.10 1.03 1.11*  CALCIUM 9.2  --  9.1  MG 2.1  --   --    CBC:  Recent Labs Lab 09/15/14 2110 09/16/14 0143 09/17/14 0825  WBC 7.8 7.1 10.2  NEUTROABS 4.5  --   --   HGB 14.2 14.5 13.1  HCT 43.3 43.3 39.1  MCV 92.9 92.5 93.8  PLT 236 224 222    ProBNP (last 3 results)  Recent Labs  12/04/13 1428  PROBNP  10429.0*    CBG:  Recent Labs Lab 09/15/14 2202  GLUCAP 85    Signed:  Barton Dubois  [facility] 09/18/2014, 12:51 PM

## 2014-09-18 NOTE — Progress Notes (Signed)
Patient being d/c to SNF, report called to the receiving nurse Caren Griffins. Patient transported to facility via [facility].

## 2014-09-21 ENCOUNTER — Ambulatory Visit: Payer: Self-pay | Admitting: Physician Assistant

## 2014-10-01 ENCOUNTER — Telehealth: Payer: Self-pay | Admitting: *Deleted

## 2014-10-01 NOTE — Telephone Encounter (Signed)
Diane called with results of PT-21.0 and INR-1.8.  Per Dr Melford Aase, continue Coumadin the same and echeck PT/INR in 2 weeks.  Left message to inform Diane of instructions.Marland Kitchen

## 2014-10-07 ENCOUNTER — Ambulatory Visit: Payer: Self-pay | Admitting: Emergency Medicine

## 2014-10-16 ENCOUNTER — Telehealth: Payer: Self-pay | Admitting: *Deleted

## 2014-10-16 NOTE — Telephone Encounter (Signed)
Nurse from [facility] called with PT-15 and INR-1.3.  Patient is taking Coumadin 2.5 mg daily.  Per Dr Melford Aase, increase 2.5 mg tab to 1 1/2 tab 4 days a week-T,TH,SA,SU and 1 tab on M,W.F.  Left message on her VM to inform nurse.

## 2014-11-05 ENCOUNTER — Telehealth: Payer: Self-pay | Admitting: *Deleted

## 2014-11-05 NOTE — Telephone Encounter (Signed)
[facility] nurse called with PT/INR of 21.2 and 1.8.  She reported her pulse rate was 46, but patient not light-headed, dizzy or tired.  Per Dr Melford Aase, continue medications the same and patient needs OV in the next 10 to 14 days.  Nurse aware.

## 2014-11-18 ENCOUNTER — Other Ambulatory Visit: Payer: Self-pay

## 2014-11-18 MED ORDER — ROSUVASTATIN CALCIUM 5 MG PO TABS: 5.0000 mg | ORAL_TABLET | Freq: Every day | ORAL | Status: DC

## 2014-11-18 MED ORDER — FENOFIBRATE MICRONIZED 134 MG PO CAPS: ORAL_CAPSULE | ORAL | Status: DC

## 2014-11-18 MED ORDER — WARFARIN SODIUM 2.5 MG PO TABS
ORAL_TABLET | ORAL | Status: DC
Start: 2014-11-18 — End: 2015-01-12

## 2014-11-18 MED ORDER — DILTIAZEM HCL ER COATED BEADS 180 MG PO CP24: 180.0000 mg | ORAL_CAPSULE | Freq: Every day | ORAL | Status: DC

## 2014-11-26 ENCOUNTER — Ambulatory Visit (INDEPENDENT_AMBULATORY_CARE_PROVIDER_SITE_OTHER): Payer: Medicare Other | Admitting: Physician Assistant

## 2014-11-26 ENCOUNTER — Encounter: Payer: Self-pay | Admitting: Physician Assistant

## 2014-11-26 VITALS — BP 140/82 | HR 56 | Temp 97.7°F | Resp 16 | Ht 65.25 in | Wt 177.0 lb

## 2014-11-26 DIAGNOSIS — E559 Vitamin D deficiency, unspecified: Secondary | ICD-10-CM

## 2014-11-26 DIAGNOSIS — I2723 Pulmonary hypertension due to lung diseases and hypoxia: Secondary | ICD-10-CM

## 2014-11-26 DIAGNOSIS — Z79899 Other long term (current) drug therapy: Secondary | ICD-10-CM

## 2014-11-26 DIAGNOSIS — E785 Hyperlipidemia, unspecified: Secondary | ICD-10-CM

## 2014-11-26 DIAGNOSIS — I272 Other secondary pulmonary hypertension: Secondary | ICD-10-CM

## 2014-11-26 DIAGNOSIS — N189 Chronic kidney disease, unspecified: Secondary | ICD-10-CM

## 2014-11-26 DIAGNOSIS — E1122 Type 2 diabetes mellitus with diabetic chronic kidney disease: Secondary | ICD-10-CM

## 2014-11-26 DIAGNOSIS — M1A9XX1 Chronic gout, unspecified, with tophus (tophi): Secondary | ICD-10-CM

## 2014-11-26 DIAGNOSIS — N184 Chronic kidney disease, stage 4 (severe): Secondary | ICD-10-CM

## 2014-11-26 DIAGNOSIS — M109 Gout, unspecified: Secondary | ICD-10-CM | POA: Insufficient documentation

## 2014-11-26 DIAGNOSIS — I10 Essential (primary) hypertension: Secondary | ICD-10-CM

## 2014-11-26 DIAGNOSIS — I4891 Unspecified atrial fibrillation: Secondary | ICD-10-CM

## 2014-11-26 DIAGNOSIS — M545 Low back pain, unspecified: Secondary | ICD-10-CM

## 2014-11-26 DIAGNOSIS — J449 Chronic obstructive pulmonary disease, unspecified: Secondary | ICD-10-CM

## 2014-11-26 LAB — CBC WITH DIFFERENTIAL/PLATELET
BASOS ABS: 0.1 10*3/uL (ref 0.0–0.1)
BASOS PCT: 1 % (ref 0–1)
EOS ABS: 0.2 10*3/uL (ref 0.0–0.7)
EOS PCT: 3 % (ref 0–5)
HEMATOCRIT: 43.5 % (ref 36.0–46.0)
Hemoglobin: 14.6 g/dL (ref 12.0–15.0)
Lymphocytes Relative: 28 % (ref 12–46)
Lymphs Abs: 1.7 10*3/uL (ref 0.7–4.0)
MCH: 32.1 pg (ref 26.0–34.0)
MCHC: 33.6 g/dL (ref 30.0–36.0)
MCV: 95.6 fL (ref 78.0–100.0)
MONO ABS: 0.5 10*3/uL (ref 0.1–1.0)
MONOS PCT: 8 % (ref 3–12)
MPV: 10.2 fL (ref 8.6–12.4)
Neutro Abs: 3.7 10*3/uL (ref 1.7–7.7)
Neutrophils Relative %: 60 % (ref 43–77)
PLATELETS: 229 10*3/uL (ref 150–400)
RBC: 4.55 MIL/uL (ref 3.87–5.11)
RDW: 15.5 % (ref 11.5–15.5)
WBC: 6.2 10*3/uL (ref 4.0–10.5)

## 2014-11-26 MED ORDER — PANTOPRAZOLE SODIUM 40 MG PO TBEC
40.0000 mg | DELAYED_RELEASE_TABLET | Freq: Every day | ORAL | Status: AC
Start: 2014-11-26 — End: 2015-11-26

## 2014-11-26 MED ORDER — ROSUVASTATIN CALCIUM 20 MG PO TABS: 20.0000 mg | ORAL_TABLET | Freq: Every day | ORAL | Status: AC

## 2014-11-26 MED ORDER — FENOFIBRATE 145 MG PO TABS: 145.0000 mg | ORAL_TABLET | Freq: Every day | ORAL | Status: DC

## 2014-11-26 NOTE — Patient Instructions (Signed)
Your PT/INR is the test we use to check your coumadin level.  Your goal for your INR is between 2-3.  If you number is below 2 than your blood is thick and you need more coumadin. You are at risk for stroke or clotting during this time period.  If you number is above 3 than your blood is too thin and you need less coumadin or can eat more greens at this time. You are at risk for stomach or head bleeds and need to be careful.

## 2014-11-26 NOTE — Progress Notes (Signed)
Assessment and Plan: 1. Essential hypertension - continue medications, DASH diet, exercise and monitor at home. Call if greater than 130/80.  - CBC with Differential/Platelet - BASIC METABOLIC PANEL WITH GFR - Hepatic function panel - TSH  2. Atrial fibrillation controlled rate - Protime-INR, rate controlled, no brady, continue same  3. Chronic obstructive pulmonary disease, unspecified COPD, unspecified chronic bronchitis type Advised to stop smoking, )2 95-96% RA   4. Type 2 diabetes mellitus with diabetic chronic kidney disease Discussed general issues about diabetes pathophysiology and management., Educational material distributed., Suggested low cholesterol diet., Encouraged aerobic exercise., Discussed foot care., Reminded to get yearly retinal exam.  5. CKD (chronic kidney disease), stage IV Check labs, may need to adjust meds  6. Left-sided low back pain without sciatica Continue with walker, recently finished PT, still high fall risk, fall risk discussed  7. Vitamin D deficiency - Vit D  25 hydroxy (rtn osteoporosis monitoring)  8. Hyperlipidemia Recently checked, continue meds, will do 61m and break in half due to cost.   9. Medication management - Magnesium  10. Chronic gout with tophus, unspecified cause, unspecified site Continue meds  11. Pulmonary hypertension due to chronic obstructive pulmonary disease Advised to quit smoking   Future Appointments Date Time Provider DElbert 05/11/2015 9:00 AM WUnk Pinto MD [facility] None    HPI 81y.o.female with history of a fib, CHF, COPD, tobacco abuse, CKD stage 4 presents for follow up from the hospital. Admit date to the hospital was 09/15/14, patient was discharged from the hospital on 09/18/14, patient was admitted for: Left sided facial numbness and bradycardia. She had a normal CT head, MRI was unable to be performed due to movement and family decided on conservative treatment. She was started on  coumadin her Afib [facility] diltiazem and lopressor were adjusted due to bradycardia. Echo showed PHTN. CAD with carotids 1-39%.   Last coumadin checked 3 weeks ago with home health, she is at home now, INR was 1.8. She is walking with a walker. Her BP has been running well at home, 120-130's. She is on metoprolol XL 79mand and diltiazem 79mHS. She is on coumadin 2.5mg75mW,F and 3.75 on the other days. Daughter is here with her and states she has missed some doses.   81 y51. female  presents for 3 month follow up on hypertension, cholesterol, prediabetes, and vitamin D deficiency.   Her blood pressure has been controlled at home, today their BP is BP: 140/82 mmHg  She does not workout. She denies chest pain, shortness of breath, dizziness.  She is on cholesterol medication and denies myalgias. Her cholesterol is at goal. The cholesterol last visit was:   Lab Results  Component Value Date   CHOL 128 09/16/2014   HDL 41 09/16/2014   LDLCALC 50 09/16/2014   TRIG 185* 09/16/2014   CHOLHDL 3.1 09/16/2014   She has been working on diet and exercise for prediabetes, and denies paresthesia of the feet, polydipsia, polyuria and visual disturbances. Last A1C in the office was:  Lab Results  Component Value Date   HGBA1C 6.4* 09/16/2014  Patient is on Vitamin D supplement.   Lab Results  Component Value Date   VD25OH 69 02/19/2014      Blood pressure 140/82, pulse 56, temperature 97.7 F (36.5 C), resp. rate 16, height 5' 5.25" (1.657 m), weight 177 lb (80.287 kg).  BMI is Body mass index is 29.24 kg/(m^2)., she is  working on diet and exercise. Wt Readings from Last 3 Encounters:  11/26/14 177 lb (80.287 kg)  09/17/14 188 lb (85.276 kg)  06/15/14 202 lb (91.627 kg)     Images while in the hospital: Dg Chest 2 View  09/16/2014   CLINICAL DATA:  Stroke.  EXAM: CHEST  2 VIEW  COMPARISON:  05/18/2014  FINDINGS: Cardiac enlargement. No pulmonary vascular  congestion. No focal airspace disease or consolidation in the lungs. No blunting of costophrenic angles. No pneumothorax. Thoracolumbar scoliosis convex towards the right. Tortuous and calcified aorta. Degenerative changes in the spine and shoulders.  IMPRESSION: Cardiac enlargement.  No evidence of active pulmonary disease.   Electronically Signed   By: Lucienne Capers M.D.   On: 09/16/2014 01:58   Ct Head Wo Contrast  09/15/2014   CLINICAL DATA:  Syncopal episode.  Numbness in the left face.  EXAM: CT HEAD WITHOUT CONTRAST  TECHNIQUE: Contiguous axial images were obtained from the base of the skull through the vertex without intravenous contrast.  COMPARISON:  05/15/2014  FINDINGS: There is no evidence of mass effect, midline shift, or extra-axial fluid collections. There is no evidence of a space-occupying lesion or intracranial hemorrhage. There is no evidence of a cortical-based area of acute infarction. There is generalized cerebral atrophy. There is periventricular white matter low attenuation likely secondary to microangiopathy.  The ventricles and sulci are appropriate for the patient's age. The basal cisterns are patent.  Visualized portions of the orbits are unremarkable. The visualized portions of the paranasal sinuses and mastoid air cells are unremarkable. Cerebrovascular atherosclerotic calcifications are noted.  The osseous structures are unremarkable.  IMPRESSION: 1. No acute intracranial pathology. 2. Chronic microvascular disease and cerebral atrophy.   Electronically Signed   By: Kathreen Devoid   On: 09/15/2014 20:58    Past Medical History  Diagnosis Date  . Hyperlipidemia   . Gout   . History of small bowel obstruction   . COPD (chronic obstructive pulmonary disease)   . Substance abuse     Tobacco  . Depression   . Pulmonary hypertension   . Umbilical hernia   . Bronchitis   . Atrial fibrillation   . High cholesterol   . Pneumonia   . Arthritis   . Abdominal aortic aneurysm      followed by Dr Kellie Simmering- 4.8 cm per [place] 6/12- [facility]  . Complication of anesthesia     slow to wake up per pt- anesthesia record on chart  . Hypertension   . Anxiety   . Atherosclerosis of aorta   . Systolic heart failure     EF 40-45%      Allergies  Allergen Reactions  . Hctz [Hydrochlorothiazide]     unknown  . Sulfa Antibiotics     unknown  . Tetracyclines & Related     unknown      Current Outpatient Prescriptions on File Prior to Visit  Medication Sig Dispense Refill  . acetaminophen (TYLENOL) 500 MG tablet Take 500 mg by mouth every 6 (six) hours as needed for moderate pain.    Marland Kitchen allopurinol (ZYLOPRIM) 300 MG tablet TAKE 1 TABLET ONCE DAILY. FOR GOUT 90 tablet 1  . aspirin EC 81 MG EC tablet Take 1 tablet (81 mg total) by mouth daily.    . Cholecalciferol (CVS VIT D 5000 HIGH-POTENCY PO) Take 5,000 Units by mouth daily.     . citalopram (CELEXA) 20 MG tablet Take 1 tablet (20 mg total) by mouth daily. [place]  tablet 1  . dexlansoprazole (DEXILANT) 60 MG capsule Take 1 capsule (60 mg total) by mouth daily. 30 capsule 3  . diltiazem (CARDIZEM CD) 180 MG 24 hr capsule Take 1 capsule (180 mg total) by mouth daily. 30 capsule 1  . docusate sodium 100 MG CAPS Take 100 mg by mouth 2 (two) times daily. 60 capsule 0  . fenofibrate micronized (LOFIBRA) 134 MG capsule TAKE 1 CAPSULE ONCE DAILY FOR BLOOD FATS. 30 capsule 1  . furosemide (LASIX) 20 MG tablet Take 1 tablet (20 mg total) by mouth daily. 90 tablet 0  . Magnesium 250 MG TABS Take 250 mg by mouth daily.    . montelukast (SINGULAIR) 10 MG tablet Take 1 tablet (10 mg total) by mouth at bedtime. 30 tablet 3  . Multiple Vitamins-Minerals (CENTRUM) tablet Take 1 tablet by mouth daily.     . potassium chloride (K-DUR) 10 MEQ tablet Take 1 tablet (10 mEq total) by mouth 2 (two) times daily. 60 tablet 2  . rosuvastatin (CRESTOR) 5 MG tablet Take 1 tablet (5 mg total) by mouth daily. 30 tablet 1  . warfarin (COUMADIN) 2.5 MG tablet Take  one tablet by mouth once daily except Wednesday for anticoag/afib 30 tablet 1   No current facility-administered medications on file prior to visit.    ROS: all negative except above.   Physical Exam: Filed Weights   11/26/14 1552  Weight: 177 lb (80.287 kg)   BP 140/82 mmHg  Pulse 56  Temp(Src) 97.7 F (36.5 C)  Resp 16  Ht 5' 5.25" (1.657 m)  Wt 177 lb (80.287 kg)  BMI 29.24 kg/m2 General Appearance: Well nourished, in no apparent distress. Eyes: PERRLA, EOMs, conjunctiva no swelling or erythema Sinuses: No Frontal/maxillary tenderness ENT/Mouth: Ext aud canals clear, TMs without erythema, bulging. No erythema, swelling, or exudate on post pharynx.  Tonsils not swollen or erythematous. Left posterior pharynx with unscrapable dark area, no changes Neck: Supple, thyroid normal.  Respiratory: Respiratory effort normal, BS equal bilaterally, decreased breathsounds diffuse without rales, rhonchi, wheezing or stridor.  Cardio: Irreg irreg with systolic murmur heard best LSB. Brisk peripheral pulses without edema.  Abdomen: Soft, + BS, obese Non tender, no guarding, rebound, hernias, masses. Lymphatics: Non tender without lymphadenopathy.  Musculoskeletal: Full ROM, 4/5 strength, unsteady gait with walker Skin: Warm, dry without rashes, lesions, ecchymosis.  Neuro: Cranial nerves intact. Normal muscle tone, no cerebellar symptoms.  Psych: Awake and oriented X 3, normal affect, Insight and Judgment appropriate.     Vicie Mutters, PA-C 4:26 PM [facility]

## 2014-11-27 LAB — BASIC METABOLIC PANEL WITH GFR
BUN: 22 mg/dL (ref 6–23)
CALCIUM: 9.3 mg/dL (ref 8.4–10.5)
CO2: 29 meq/L (ref 19–32)
Chloride: 104 mEq/L (ref 96–112)
Creat: 1.38 mg/dL — ABNORMAL HIGH (ref 0.50–1.10)
GFR, Est African American: 41 mL/min — ABNORMAL LOW
GFR, Est Non African American: 35 mL/min — ABNORMAL LOW
GLUCOSE: 96 mg/dL (ref 70–99)
POTASSIUM: 4.4 meq/L (ref 3.5–5.3)
SODIUM: 141 meq/L (ref 135–145)

## 2014-11-27 LAB — HEPATIC FUNCTION PANEL
ALT: 13 U/L (ref 0–35)
AST: 24 U/L (ref 0–37)
Albumin: 3.4 g/dL — ABNORMAL LOW (ref 3.5–5.2)
Alkaline Phosphatase: 71 U/L (ref 39–117)
BILIRUBIN DIRECT: 0.2 mg/dL (ref 0.0–0.3)
BILIRUBIN INDIRECT: 0.3 mg/dL (ref 0.2–1.2)
TOTAL PROTEIN: 6.5 g/dL (ref 6.0–8.3)
Total Bilirubin: 0.5 mg/dL (ref 0.2–1.2)

## 2014-11-27 LAB — TSH: TSH: 1.023 u[IU]/mL (ref 0.350–4.500)

## 2014-11-27 LAB — MAGNESIUM: Magnesium: 2.1 mg/dL (ref 1.5–2.5)

## 2014-11-27 LAB — PROTIME-INR
INR: 1.17 (ref <1.50)
Prothrombin Time: 14.9 seconds (ref 11.6–15.2)

## 2014-11-27 LAB — VITAMIN D 25 HYDROXY (VIT D DEFICIENCY, FRACTURES): Vit D, 25-Hydroxy: 48 ng/mL (ref 30–100)

## 2014-12-20 ENCOUNTER — Other Ambulatory Visit: Payer: Self-pay | Admitting: Internal Medicine

## 2014-12-27 ENCOUNTER — Other Ambulatory Visit: Payer: Self-pay | Admitting: Internal Medicine

## 2015-01-06 ENCOUNTER — Encounter: Payer: Self-pay | Admitting: Physician Assistant

## 2015-01-06 ENCOUNTER — Ambulatory Visit (INDEPENDENT_AMBULATORY_CARE_PROVIDER_SITE_OTHER): Payer: Medicare Other | Admitting: Physician Assistant

## 2015-01-06 VITALS — BP 122/72 | HR 56 | Temp 97.9°F | Resp 16 | Ht 65.5 in | Wt 180.0 lb

## 2015-01-06 DIAGNOSIS — J9601 Acute respiratory failure with hypoxia: Secondary | ICD-10-CM

## 2015-01-06 DIAGNOSIS — Z0001 Encounter for general adult medical examination with abnormal findings: Secondary | ICD-10-CM

## 2015-01-06 DIAGNOSIS — Z79899 Other long term (current) drug therapy: Secondary | ICD-10-CM

## 2015-01-06 DIAGNOSIS — K219 Gastro-esophageal reflux disease without esophagitis: Secondary | ICD-10-CM

## 2015-01-06 DIAGNOSIS — K432 Incisional hernia without obstruction or gangrene: Secondary | ICD-10-CM

## 2015-01-06 DIAGNOSIS — Z8719 Personal history of other diseases of the digestive system: Secondary | ICD-10-CM

## 2015-01-06 DIAGNOSIS — I714 Abdominal aortic aneurysm, without rupture, unspecified: Secondary | ICD-10-CM

## 2015-01-06 DIAGNOSIS — E785 Hyperlipidemia, unspecified: Secondary | ICD-10-CM

## 2015-01-06 DIAGNOSIS — N189 Chronic kidney disease, unspecified: Secondary | ICD-10-CM

## 2015-01-06 DIAGNOSIS — S22080S Wedge compression fracture of T11-T12 vertebra, sequela: Secondary | ICD-10-CM

## 2015-01-06 DIAGNOSIS — J449 Chronic obstructive pulmonary disease, unspecified: Secondary | ICD-10-CM

## 2015-01-06 DIAGNOSIS — I2723 Pulmonary hypertension due to lung diseases and hypoxia: Secondary | ICD-10-CM

## 2015-01-06 DIAGNOSIS — N183 Chronic kidney disease, stage 3 unspecified: Secondary | ICD-10-CM

## 2015-01-06 DIAGNOSIS — F325 Major depressive disorder, single episode, in full remission: Secondary | ICD-10-CM | POA: Insufficient documentation

## 2015-01-06 DIAGNOSIS — E1122 Type 2 diabetes mellitus with diabetic chronic kidney disease: Secondary | ICD-10-CM

## 2015-01-06 DIAGNOSIS — Z720 Tobacco use: Secondary | ICD-10-CM

## 2015-01-06 DIAGNOSIS — R6889 Other general symptoms and signs: Secondary | ICD-10-CM

## 2015-01-06 DIAGNOSIS — I482 Chronic atrial fibrillation, unspecified: Secondary | ICD-10-CM

## 2015-01-06 DIAGNOSIS — Z9181 History of falling: Secondary | ICD-10-CM | POA: Insufficient documentation

## 2015-01-06 DIAGNOSIS — M1A9XX1 Chronic gout, unspecified, with tophus (tophi): Secondary | ICD-10-CM

## 2015-01-06 DIAGNOSIS — E559 Vitamin D deficiency, unspecified: Secondary | ICD-10-CM

## 2015-01-06 DIAGNOSIS — G459 Transient cerebral ischemic attack, unspecified: Secondary | ICD-10-CM

## 2015-01-06 DIAGNOSIS — I10 Essential (primary) hypertension: Secondary | ICD-10-CM

## 2015-01-06 DIAGNOSIS — I2510 Atherosclerotic heart disease of native coronary artery without angina pectoris: Secondary | ICD-10-CM

## 2015-01-06 DIAGNOSIS — E669 Obesity, unspecified: Secondary | ICD-10-CM

## 2015-01-06 DIAGNOSIS — I5022 Chronic systolic (congestive) heart failure: Secondary | ICD-10-CM

## 2015-01-06 NOTE — Progress Notes (Signed)
MEDICARE ANNUAL WELLNESS VISIT AND FOLLOW UP  Assessment:   1. Essential hypertension - continue medications, DASH diet, exercise and monitor at home. Call if greater than 130/80.  - CBC with Differential/Platelet - BASIC METABOLIC PANEL WITH GFR - Hepatic function panel - TSH  2. Type 2 diabetes mellitus with diabetic chronic kidney disease Discussed general issues about diabetes pathophysiology and management., Educational material distributed., Suggested low cholesterol diet., Encouraged aerobic exercise., Discussed foot care., Reminded to get yearly retinal exam. - Hemoglobin A1c - HM DIABETES FOOT EXAM  3. Hyperlipidemia -continue medications, check lipids, decrease fatty foods, increase activity.  - Lipid panel  4. Obesity (BMI 38.76) Obesity with co morbidities- long discussion about weight loss, diet, and exercise  5. Chronic gout with tophus, unspecified cause, unspecified site Continue allopurinol - Uric acid  6. Vitamin D deficiency - Vit D  25 hydroxy (rtn osteoporosis monitoring)  7. Medication management - Magnesium  8. Transient cerebral ischemia, unspecified transient cerebral ischemia type Advised to stop smoking Control blood pressure, cholesterol, glucose, increase exercise.   9. Pulmonary hypertension due to chronic obstructive pulmonary disease Advised to stop smoking  10. Chronic systolic CHF (congestive heart failure) Advised to stop smoking Monitor weight, continue medications.  11. Atrial fibrillation, chronic Discussed fall risk versus stroke risk, at this time patient and daughter want to remain on coumadin, will keep towards low end and will monitor very closely.  - Protime-INR  12. ASHD w/Diastolic Heart Failure Advised to stop smoking Monitor weight, continue medications.   13. Abdominal aortic aneurysm Control blood pressure, cholesterol, glucose, increase exercise.  Advised to stop smoking  14. Chronic obstructive pulmonary  disease, unspecified COPD, unspecified chronic bronchitis type Advised to stop smoking, will get CXR PRN, continue meds.   15. Acute respiratory failure with hypoxia Advised to stop smoking, declines and understands risk  16. Gastroesophageal reflux disease without esophagitis Continue PPI/H2 blocker, diet discussed  17. T12 compression fracture, sequela Get DEXA, patient declines at this time  18. Chronic kidney disease, stage 3 (moderate) Increase fluids, avoid NSAIDS, monitor sugars, will monitor  19. Tobacco abuse Smoking cessation-  counseled patient on the dangers of tobacco use, advised patient to stop smoking, and reviewed strategies to maximize success, continue to try chantix  20. History of small bowel obstruction monitor  21. Incisional hernia, without obstruction or gangrene monitor  22. Depression, major, in remission Continue medications  23. High fall risk Discussed fall risk, education material given, just finished PT, continue to use walker.   Over 30 minutes of exam, counseling, chart review, and critical decision making was performed  Plan:   During the course of the visit the patient was educated and counseled about appropriate screening and preventive services including:    Pneumococcal vaccine   Influenza vaccine  Td vaccine  Screening electrocardiogram  Screening mammography  Bone densitometry screening  Colorectal cancer screening  Diabetes screening  Glaucoma screening  Nutrition counseling   Advanced directives: given info/requested  Conditions/risks identified: Diabetes is at goal, ACE/ARB therapy: Yes. Urinary Incontinence is an issue: discussed non pharmacology and pharmacology options.  Fall risk: high- discussed PT, home fall assessment, medications.    Subjective:   Heather Stevenson is a 81 y.o. female who presents for Medicare Annual Wellness Visit and 3 month follow up on hypertension, diabetes, hyperlipidemia,  vitamin D def.  Date of last medicare wellness visit is unknown.   Her blood pressure has been controlled at home, today their BP is  BP: 122/72 mmHg She does not workout. She denies chest pain, shortness of breath, dizziness.  She has a prolonged heart history of Afib on coumadin, AAA followed by Dr. Kellie Simmering (4.8 via [place]), systolic heart failure with EF 40-45%. She is on lasix, coumadin, BB, verapamil. Denies PND, orthopnea, and edema, weight is stable. Her coumadin was low last time so it was increased to 3.75 mg 4 days a week and 2.5 mg 3 days a week.  Lab Results  Component Value Date   INR 1.17 11/26/2014   INR 2.09* 09/18/2014   INR 1.93* 09/17/2014   Wt Readings from Last 3 Encounters:  01/06/15 180 lb (81.647 kg)  11/26/14 177 lb (80.287 kg)  09/17/14 188 lb (85.276 kg)   She had a fall that causes compression fracture, she still walks with a walker or is in a wheel chair.  She continues to smoke despite the above mentioned complications and COPD with pulmonary HTN. Will "smoke until she dies" She is on cholesterol medication, crestor 38m, doing 1/2 pill a day and denies myalgias. Her cholesterol is at goal. The cholesterol last visit was:   Lab Results  Component Value Date   CHOL 128 09/16/2014   HDL 41 09/16/2014   LDLCALC 50 09/16/2014   TRIG 185* 09/16/2014   CHOLHDL 3.1 09/16/2014   She has been working on diet and exercise for diabetes, and denies paresthesia of the feet, polydipsia, polyuria and visual disturbances. Last A1C in the office was:  Lab Results  Component Value Date   HGBA1C 6.4* 09/16/2014   Patient is on Vitamin D supplement. Lab Results  Component Value Date   VD25OH 48 11/26/2014     Patient is on allopurinol for gout and does not report a recent flare.  She is on celexa for depression.  She is at home now, walks with walker, no recent falls.   Medication Review Current Outpatient Prescriptions on File Prior to Visit  Medication Sig Dispense  Refill  . acetaminophen (TYLENOL) 500 MG tablet Take 500 mg by mouth every 6 (six) hours as needed for moderate pain.    .Marland Kitchenallopurinol (ZYLOPRIM) 300 MG tablet TAKE 1 TABLET ONCE DAILY. FOR GOUT 90 tablet 1  . aspirin EC 81 MG EC tablet Take 1 tablet (81 mg total) by mouth daily.    . Cholecalciferol ([employer] VIT D 5000 HIGH-POTENCY PO) Take 5,000 Units by mouth daily.     . citalopram (CELEXA) 20 MG tablet Take 1 tablet (20 mg total) by mouth daily. 90 tablet 1  . diltiazem (CARDIZEM CD) 180 MG 24 hr capsule Take 1 capsule (180 mg total) by mouth daily. 30 capsule 1  . docusate sodium 100 MG CAPS Take 100 mg by mouth 2 (two) times daily. 60 capsule 0  . fenofibrate (TRICOR) 145 MG tablet Take 1 tablet (145 mg total) by mouth daily. 30 tablet 3  . fenofibrate micronized (LOFIBRA) 134 MG capsule TAKE 1 CAPSULE ONCE DAILY FOR BLOOD FATS. 30 capsule 1  . furosemide (LASIX) 20 MG tablet TAKE 1 TABLET 3 TIMES A DAY. 90 tablet 1  . Magnesium 250 MG TABS Take 250 mg by mouth daily.    . metoprolol succinate (TOPROL-XL) 100 MG 24 hr tablet Take 100 mg by mouth daily. Take with or immediately following a meal.    . montelukast (SINGULAIR) 10 MG tablet TAKE ONE TABLET AT BEDTIME. 90 tablet 3  . Multiple Vitamins-Minerals (CENTRUM) tablet Take 1 tablet by mouth daily.     .Marland Kitchen  pantoprazole (PROTONIX) 40 MG tablet Take 1 tablet (40 mg total) by mouth daily. 30 tablet 3  . potassium chloride (K-DUR) 10 MEQ tablet Take 1 tablet (10 mEq total) by mouth 2 (two) times daily. 60 tablet 2  . rosuvastatin (CRESTOR) 20 MG tablet Take 1 tablet (20 mg total) by mouth daily. 30 tablet 3  . warfarin (COUMADIN) 2.5 MG tablet Take one tablet by mouth once daily except Wednesday for anticoag/afib 30 tablet 1   No current facility-administered medications on file prior to visit.    Current Problems (verified) Patient Active Problem List   Diagnosis Date Noted  . Depression, major, in remission 01/06/2015  . Gout 11/26/2014   . Pulmonary hypertension due to chronic obstructive pulmonary disease 11/26/2014  . TIA (transient ischemic attack)   . Chronic kidney disease   . Chronic systolic CHF (congestive heart failure) 05/26/2014  . Atrial fibrillation, chronic 05/26/2014  . GERD (gastroesophageal reflux disease) 05/26/2014  . T12 compression fracture 05/15/2014  . Tobacco abuse 05/15/2014  . Acute respiratory failure with hypoxia 12/05/2013  . ASHD w/Diastolic Heart Failure 40/98/1191  . T2_NIDDM w/Stage 4 CKD (GFR 28 ml/min) 10/02/2013  . Vitamin D deficiency 10/02/2013  . Medication management 10/02/2013  . Obesity (BMI 38.76) 10/02/2013  . Hypertension   . Hyperlipidemia   . Abdominal aortic aneurysm   . COPD (chronic obstructive pulmonary disease)   . History of small bowel obstruction   . Incisional hernia 02/01/2012    Screening Tests Immunization History  Administered Date(s) Administered  . DTaP 07/11/2003  . Influenza Whole 05/06/2013  . Influenza, High Dose Seasonal PF 05/01/2014  . Pneumococcal Conjugate-13 05/01/2014  . Pneumococcal Polysaccharide-23 11/27/2012    Preventative care: Last colonoscopy: Never and declines Last mammogram: 2008 declines another Last pap smear/pelvic exam: s/p hysterectomy DEXA: 2006 = osteopenia Echo 2016 Carotid US 2016 Ct head 2016 Ct AB/Pelvis 2014 CXR 2016  Prior vaccinations: TD or Tdap: 2005  Influenza: 2015 Pneumococcal: 2014 Prevnar13: 2015 Shingles/Zostavax: declines  Names of Other Physician/Practitioners you currently use: 1. [facility]- here for primary care 2. Does not have an eye doctor, YEARs since she has seen one  3. None dentist Patient Care Team: Unk Pinto, MD as PCP - General (Internal Medicine)  Past Surgical History  Procedure Laterality Date  . Small intestine surgery  05/2010  . Abdominal hysterectomy      Vaginal  . Hernia repair  05/2010    incarcerated Umbilical  hernia  . Tonsillectomy    . Incisional hernia repair  02/21/2012    Procedure: LAPAROSCOPIC INCISIONAL HERNIA;  Surgeon: Harl Bowie, MD;  Location: [facility];  Service: General;  Laterality: N/A;   Family History  Problem Relation Age of Onset  . Cancer Mother   . Heart disease Father   . Cancer Daughter   . Deep vein thrombosis Daughter    History  Substance Use Topics  . Smoking status: Current Every Day Smoker -- 0.50 packs/day for 50 years    Types: Cigarettes  . Smokeless tobacco: Never Used  . Alcohol Use: No    MEDICARE WELLNESS OBJECTIVES: Tobacco use: She does smoke.  1/2-1 ppd Alcohol Current alcohol use: rare Caffeine Current caffeine use: coffee 1 /day and caffeinated soft drinks 1 /day Osteoporosis: postmenopausal estrogen deficiency and dietary calcium and/or vitamin D deficiency, History of fracture in the past year: yes Diet: fair Physical activity: walks when she can but limited by orthopedic and cardiopulmonary  conditions Depression/mood screen:   Depression screen [facility] 2/9 01/06/2015  Decreased Interest 0  Down, Depressed, Hopeless 0  PHQ - 2 Score 0  Altered sleeping -  Tired, decreased energy -  Change in appetite -  Feeling bad or failure about yourself  -  Trouble concentrating -  Moving slowly or fidgety/restless -  Suicidal thoughts -  PHQ-9 Score -   Hearing: normal Visual acuity: impaired,  does not perform annual eye exam  ADLs:  In your present state of health, do you have any difficulty performing the following activities: 01/06/2015 09/16/2014  Hearing? N N  Vision? Y Y  Difficulty concentrating or making decisions? Y N  Walking or climbing stairs? Y Y  Dressing or bathing? Y Y  Doing errands, shopping? Tempie Donning  Preparing Food and eating ? Y -  Using the Toilet? N -  In the past six months, have you accidently leaked urine? Y -  Do you have problems with loss of bowel control? N -  Managing your Medications? Y -  Managing your  Finances? Y -  Housekeeping or managing your Housekeeping? Y -    Fall risk: High Risk Cognitive Testing  Alert? Yes  Normal Appearance?Yes  Oriented to person? Yes  Place? Yes   Time? Yes  Recall of three objects?  2/3  Can perform simple calculations? Yes  Displays appropriate judgment?Yes  Can read the correct time from a watch face?Yes EOL planning: Does patient have an advance directive?: No Would patient like information on creating an advanced directive?: Yes - Educational materials given     Objective:   Blood pressure 122/72, pulse 56, temperature 97.9 F (36.6 C), resp. rate 16, height 5' 5.5" (1.664 m), weight 180 lb (81.647 kg). Body mass index is 29.49 kg/(m^2). General Appearance: Well nourished, in no apparent distress. Eyes: PERRLA, EOMs, conjunctiva no swelling or erythema Sinuses: No Frontal/maxillary tenderness ENT/Mouth: Ext aud canals clear, TMs without erythema, bulging. No erythema, swelling, or exudate on post pharynx.  Tonsils not swollen or erythematous. Left posterior pharynx with unscrapable dark area, no changes Neck: Supple, thyroid normal.  Respiratory: Respiratory effort normal, BS equal bilaterally, decreased breathsounds diffuse without rales, rhonchi, wheezing or stridor.  Cardio: Irreg irreg with systolic murmur heard best LSB. Brisk peripheral pulses with mild edema.  Abdomen: Soft, + BS, obese Non tender, no guarding, rebound, hernias, masses. Lymphatics: Non tender without lymphadenopathy.  Musculoskeletal: Full ROM, 4/5 strength, unsteady gait with walker Skin: Warm, dry without rashes, lesions, ecchymosis.  Neuro: Cranial nerves intact. Normal muscle tone, no cerebellar symptoms.  Psych: Awake and oriented X 3, normal affect, Insight and Judgment appropriate.    Medicare Attestation I have personally reviewed: The patient's medical and social history Their use of alcohol, tobacco or illicit drugs Their current medications and  supplements The patient's functional ability including ADLs,fall risks, home safety risks, cognitive, and hearing and visual impairment Diet and physical activities Evidence for depression or mood disorders  The patient's weight, height, BMI, and visual acuity have been recorded in the chart.  I have made referrals, counseling, and provided education to the patient based on review of the above and I have provided the patient with a written personalized care plan for preventive services.     Vicie Mutters, PA-C   01/06/2015

## 2015-01-06 NOTE — Patient Instructions (Signed)
Double check on protonix/pantoprazole, this is cheaper and replaces dexilant if it helps her.   Osteoporosis Osteoporosis happens when your bones become weak because of bone loss. Weak bones can break (fracture) more easily with slips or falls. You are more likely to develop osteoporosis if:  You are a woman.  You are older than 50 years.  You are white or Asian.  You are very thin.  Someone in your family has had osteoporosis.  You smoke or use nicotine. CAUSES   Smoking.  Too much drinking.  Being a weight below normal.  Not being active.  Not going outside in the sun enough.  Certain medical conditions, such as diabetes or Crohn disease.  Certain medicines, such as steroids or antiseizure medicines. TREATMENT  The goal of treatment is to strengthen bones. There are different types of medicines that help your bones. Some medicines make your bones more solid. Some medicines help to slow down how much bone you lose. Your doctor may check to see if you are getting enough calcium and vitamin D in your diet. PREVENTION   Make sure you get enough calcium and vitamin D.  Make sure you exercise often.  If you smoke, quit. MAKE SURE YOU:  Understand these instructions.  Will watch your condition.  Will get help right away if you are not doing well or get worse. Document Released: 09/18/2011 Document Reviewed: 09/18/2011 [facility] Patient Information 2015 [place]. This information is not intended to replace advice given to you by your health care provider. Make sure you discuss any questions you have with your health care provider.  Fall Prevention and Home Safety Falls cause injuries and can affect all age groups. It is possible to prevent falls.  HOW TO PREVENT FALLS  Wear shoes with rubber soles that do not have an opening for your toes.  Keep the inside and outside of your house well lit.  Use night lights throughout your home.  Remove clutter from  floors.  Clean up floor spills.  Remove throw rugs or fasten them to the floor with carpet tape.  Do not place electrical cords across pathways.  Put grab bars by your tub, shower, and toilet. Do not use towel bars as grab bars.  Put handrails on both sides of the stairway. Fix loose handrails.  Do not climb on stools or stepladders, if possible.  Do not wax your floors.  Repair uneven or unsafe sidewalks, walkways, or stairs.  Keep items you use a lot within reach.  Be aware of pets.  Keep emergency numbers next to the telephone.  Put smoke detectors in your home and near bedrooms. Ask your doctor what other things you can do to prevent falls. Document Released: 04/22/2009 Document Revised: 12/26/2011 Document Reviewed: 09/26/2011 [facility] Patient Information 2015 [place], [place]. This information is not intended to replace advice given to you by your health care provider. Make sure you discuss any questions you have with your health care provider.  Your PT/INR is the test we use to check your coumadin level.  Your goal for your INR is between 2-3.  If you number is below 2 than your blood is thick and you need more coumadin. You are at risk for stroke or clotting during this time period.  If you number is above 3 than your blood is too thin and you need less coumadin or can eat more greens at this time. You are at risk for stomach or head bleeds and need to be  careful.

## 2015-01-07 LAB — CBC WITH DIFFERENTIAL/PLATELET
BASOS PCT: 1 % (ref 0–1)
Basophils Absolute: 0.1 10*3/uL (ref 0.0–0.1)
EOS ABS: 0.2 10*3/uL (ref 0.0–0.7)
Eosinophils Relative: 3 % (ref 0–5)
HCT: 41.4 % (ref 36.0–46.0)
Hemoglobin: 14.3 g/dL (ref 12.0–15.0)
LYMPHS ABS: 2.1 10*3/uL (ref 0.7–4.0)
LYMPHS PCT: 28 % (ref 12–46)
MCH: 32.3 pg (ref 26.0–34.0)
MCHC: 34.5 g/dL (ref 30.0–36.0)
MCV: 93.5 fL (ref 78.0–100.0)
MONO ABS: 0.4 10*3/uL (ref 0.1–1.0)
MPV: 10.2 fL (ref 8.6–12.4)
Monocytes Relative: 5 % (ref 3–12)
NEUTROS PCT: 63 % (ref 43–77)
Neutro Abs: 4.7 10*3/uL (ref 1.7–7.7)
PLATELETS: 252 10*3/uL (ref 150–400)
RBC: 4.43 MIL/uL (ref 3.87–5.11)
RDW: 14.4 % (ref 11.5–15.5)
WBC: 7.5 10*3/uL (ref 4.0–10.5)

## 2015-01-07 LAB — HEMOGLOBIN A1C
Hgb A1c MFr Bld: 6 % — ABNORMAL HIGH (ref <5.7)
Mean Plasma Glucose: 126 mg/dL — ABNORMAL HIGH (ref <117)

## 2015-01-07 LAB — PROTIME-INR
INR: 1.75 — ABNORMAL HIGH (ref <1.50)
Prothrombin Time: 20.4 seconds — ABNORMAL HIGH (ref 11.6–15.2)

## 2015-01-07 LAB — VITAMIN D 25 HYDROXY (VIT D DEFICIENCY, FRACTURES): Vit D, 25-Hydroxy: 55 ng/mL (ref 30–100)

## 2015-01-07 LAB — BASIC METABOLIC PANEL WITH GFR
BUN: 30 mg/dL — AB (ref 6–23)
CHLORIDE: 100 meq/L (ref 96–112)
CO2: 27 mEq/L (ref 19–32)
Calcium: 8.9 mg/dL (ref 8.4–10.5)
Creat: 1.47 mg/dL — ABNORMAL HIGH (ref 0.50–1.10)
GFR, EST NON AFRICAN AMERICAN: 33 mL/min — AB
GFR, Est African American: 38 mL/min — ABNORMAL LOW
Glucose, Bld: 101 mg/dL — ABNORMAL HIGH (ref 70–99)
POTASSIUM: 4 meq/L (ref 3.5–5.3)
Sodium: 139 mEq/L (ref 135–145)

## 2015-01-07 LAB — HEPATIC FUNCTION PANEL
ALBUMIN: 3.6 g/dL (ref 3.5–5.2)
ALT: 8 U/L (ref 0–35)
AST: 17 U/L (ref 0–37)
Alkaline Phosphatase: 74 U/L (ref 39–117)
BILIRUBIN INDIRECT: 0.4 mg/dL (ref 0.2–1.2)
BILIRUBIN TOTAL: 0.6 mg/dL (ref 0.2–1.2)
Bilirubin, Direct: 0.2 mg/dL (ref 0.0–0.3)
Total Protein: 6.7 g/dL (ref 6.0–8.3)

## 2015-01-07 LAB — LIPID PANEL
CHOL/HDL RATIO: 3 ratio
Cholesterol: 124 mg/dL (ref 0–200)
HDL: 41 mg/dL — AB (ref >=46)
LDL Cholesterol: 52 mg/dL (ref 0–99)
TRIGLYCERIDES: 156 mg/dL — AB (ref <150)
VLDL: 31 mg/dL (ref 0–40)

## 2015-01-07 LAB — MAGNESIUM: MAGNESIUM: 1.9 mg/dL (ref 1.5–2.5)

## 2015-01-07 LAB — TSH: TSH: 1.855 u[IU]/mL (ref 0.350–4.500)

## 2015-01-07 LAB — URIC ACID: URIC ACID, SERUM: 4.6 mg/dL (ref 2.4–7.0)

## 2015-01-12 ENCOUNTER — Encounter (HOSPITAL_COMMUNITY): Payer: Self-pay | Admitting: *Deleted

## 2015-01-12 ENCOUNTER — Other Ambulatory Visit: Payer: Self-pay | Admitting: Internal Medicine

## 2015-01-12 ENCOUNTER — Emergency Department (HOSPITAL_COMMUNITY)
Admission: EM | Admit: 2015-01-12 | Discharge: 2015-01-12 | Disposition: A | Discharge status: Home / Self Care | Payer: Medicare Other | Attending: Emergency Medicine | Admitting: Emergency Medicine

## 2015-01-12 ENCOUNTER — Other Ambulatory Visit: Payer: Self-pay | Admitting: Emergency Medicine

## 2015-01-12 DIAGNOSIS — M109 Gout, unspecified: Secondary | ICD-10-CM | POA: Diagnosis not present

## 2015-01-12 DIAGNOSIS — E780 Pure hypercholesterolemia: Secondary | ICD-10-CM | POA: Insufficient documentation

## 2015-01-12 DIAGNOSIS — F329 Major depressive disorder, single episode, unspecified: Secondary | ICD-10-CM | POA: Diagnosis not present

## 2015-01-12 DIAGNOSIS — Z8701 Personal history of pneumonia (recurrent): Secondary | ICD-10-CM | POA: Diagnosis not present

## 2015-01-12 DIAGNOSIS — E785 Hyperlipidemia, unspecified: Secondary | ICD-10-CM | POA: Diagnosis not present

## 2015-01-12 DIAGNOSIS — I4891 Unspecified atrial fibrillation: Secondary | ICD-10-CM | POA: Diagnosis not present

## 2015-01-12 DIAGNOSIS — F419 Anxiety disorder, unspecified: Secondary | ICD-10-CM | POA: Insufficient documentation

## 2015-01-12 DIAGNOSIS — Z720 Tobacco use: Secondary | ICD-10-CM | POA: Diagnosis not present

## 2015-01-12 DIAGNOSIS — Z79899 Other long term (current) drug therapy: Secondary | ICD-10-CM | POA: Insufficient documentation

## 2015-01-12 DIAGNOSIS — M545 Low back pain, unspecified: Secondary | ICD-10-CM

## 2015-01-12 DIAGNOSIS — I10 Essential (primary) hypertension: Secondary | ICD-10-CM | POA: Diagnosis not present

## 2015-01-12 DIAGNOSIS — M1990 Unspecified osteoarthritis, unspecified site: Secondary | ICD-10-CM | POA: Diagnosis not present

## 2015-01-12 DIAGNOSIS — I5020 Unspecified systolic (congestive) heart failure: Secondary | ICD-10-CM | POA: Insufficient documentation

## 2015-01-12 DIAGNOSIS — Z7901 Long term (current) use of anticoagulants: Secondary | ICD-10-CM | POA: Diagnosis not present

## 2015-01-12 DIAGNOSIS — J449 Chronic obstructive pulmonary disease, unspecified: Secondary | ICD-10-CM | POA: Diagnosis not present

## 2015-01-12 DIAGNOSIS — M62830 Muscle spasm of back: Secondary | ICD-10-CM | POA: Diagnosis not present

## 2015-01-12 MED ORDER — IBUPROFEN 600 MG PO TABS: 600.0000 mg | ORAL_TABLET | Freq: Four times a day (QID) | ORAL | Status: DC | PRN

## 2015-01-12 MED ORDER — TRAMADOL HCL 50 MG PO TABS: 50.0000 mg | ORAL_TABLET | Freq: Four times a day (QID) | ORAL | Status: DC | PRN

## 2015-01-12 MED ORDER — TRAMADOL HCL 50 MG PO TABS
50.0000 mg | ORAL_TABLET | Freq: Once | ORAL | Status: AC
  Administered 2015-01-12: 50 mg via ORAL
  Filled 2015-01-12: qty 1

## 2015-01-12 MED ORDER — IBUPROFEN 200 MG PO TABS
600.0000 mg | ORAL_TABLET | Freq: Once | ORAL | Status: AC
  Administered 2015-01-12: 600 mg via ORAL
  Filled 2015-01-12: qty 3

## 2015-01-12 NOTE — Discharge Instructions (Signed)
Muscle Cramps and Spasms Muscle cramps and spasms occur when a muscle or muscles tighten and you have no control over this tightening (involuntary muscle contraction). They are a common problem and can develop in any muscle. The most common place is in the calf muscles of the leg. Both muscle cramps and muscle spasms are involuntary muscle contractions, but they also have differences:   Muscle cramps are sporadic and painful. They may last a few seconds to a quarter of an hour. Muscle cramps are often more forceful and last longer than muscle spasms.  Muscle spasms may or may not be painful. They may also last just a few seconds or much longer. CAUSES  It is uncommon for cramps or spasms to be due to a serious underlying problem. In many cases, the cause of cramps or spasms is unknown. Some common causes are:   Overexertion.   Overuse from repetitive motions (doing the same thing over and over).   Remaining in a certain position for a long period of time.   Improper preparation, form, or technique while performing a sport or activity.   Dehydration.   Injury.   Side effects of some medicines.   Abnormally low levels of the salts and ions in your blood (electrolytes), especially potassium and calcium. This could happen if you are taking water pills (diuretics) or you are pregnant.  Some underlying medical problems can make it more likely to develop cramps or spasms. These include, but are not limited to:   Diabetes.   Parkinson disease.   Hormone disorders, such as thyroid problems.   Alcohol abuse.   Diseases specific to muscles, joints, and bones.   Blood vessel disease where not enough blood is getting to the muscles.  HOME CARE INSTRUCTIONS   Stay well hydrated. Drink enough water and fluids to keep your urine clear or pale yellow.  It may be helpful to massage, stretch, and relax the affected muscle.  For tight or tense muscles, use a warm towel, heating  pad, or hot shower water directed to the affected area.  If you are sore or have pain after a cramp or spasm, applying ice to the affected area may relieve discomfort.  Put ice in a plastic bag.  Place a towel between your skin and the bag.  Leave the ice on for 15-20 minutes, 03-04 times a day.  Medicines used to treat a known cause of cramps or spasms may help reduce their frequency or severity. Only take over-the-counter or prescription medicines as directed by your caregiver. SEEK MEDICAL CARE IF:  Your cramps or spasms get more severe, more frequent, or do not improve over time.  MAKE SURE YOU:   Understand these instructions.  Will watch your condition.  Will get help right away if you are not doing well or get worse. Document Released: 12/16/2001 Document Revised: 10/21/2012 Document Reviewed: 06/12/2012 [facility] Patient Information 2015 [place], [place]. This information is not intended to replace advice given to you by your health care provider. Make sure you discuss any questions you have with your health care provider. Back Pain, Adult Low back pain is very common. About 1 in 5 people have back pain.The cause of low back pain is rarely dangerous. The pain often gets better over time.About half of people with a sudden onset of back pain feel better in just 2 weeks. About 8 in 10 people feel better by 6 weeks.  CAUSES Some common causes of back pain include:  Strain of  the muscles or ligaments supporting the spine.  Wear and tear (degeneration) of the spinal discs.  Arthritis.  Direct injury to the back. DIAGNOSIS Most of the time, the direct cause of low back pain is not known.However, back pain can be treated effectively even when the exact cause of the pain is unknown.Answering your caregiver's questions about your overall health and symptoms is one of the most accurate ways to make sure the cause of your pain is not dangerous. If your caregiver needs more  information, he or she may order lab work or imaging tests (X-rays or MRIs).However, even if imaging tests show changes in your back, this usually does not require surgery. HOME CARE INSTRUCTIONS For many people, back pain returns.Since low back pain is rarely dangerous, it is often a condition that people can learn to [facility] their own.   Remain active. It is stressful on the back to sit or stand in one place. Do not sit, drive, or stand in one place for more than 30 minutes at a time. Take short walks on level surfaces as soon as pain allows.Try to increase the length of time you walk each day.  Do not stay in bed.Resting more than 1 or 2 days can delay your recovery.  Do not avoid exercise or work.Your body is made to move.It is not dangerous to be active, even though your back may hurt.Your back will likely heal faster if you return to being active before your pain is gone.  Pay attention to your body when you bend and lift. Many people have less discomfortwhen lifting if they bend their knees, keep the load close to their bodies,and avoid twisting. Often, the most comfortable positions are those that put less stress on your recovering back.  Find a comfortable position to sleep. Use a firm mattress and lie on your side with your knees slightly bent. If you lie on your back, put a pillow under your knees.  Only take over-the-counter or prescription medicines as directed by your caregiver. Over-the-counter medicines to reduce pain and inflammation are often the most helpful.Your caregiver may prescribe muscle relaxant drugs.These medicines help dull your pain so you can more quickly return to your normal activities and healthy exercise.  Put ice on the injured area.  Put ice in a plastic bag.  Place a towel between your skin and the bag.  Leave the ice on for 15-20 minutes, 03-04 times a day for the first 2 to 3 days. After that, ice and heat may be alternated to reduce pain  and spasms.  Ask your caregiver about trying back exercises and gentle massage. This may be of some benefit.  Avoid feeling anxious or stressed.Stress increases muscle tension and can worsen back pain.It is important to recognize when you are anxious or stressed and learn ways to manage it.Exercise is a great option. SEEK MEDICAL CARE IF:  You have pain that is not relieved with rest or medicine.  You have pain that does not improve in 1 week.  You have new symptoms.  You are generally not feeling well. SEEK IMMEDIATE MEDICAL CARE IF:   You have pain that radiates from your back into your legs.  You develop new bowel or bladder control problems.  You have unusual weakness or numbness in your arms or legs.  You develop nausea or vomiting.  You develop abdominal pain.  You feel faint. Document Released: 06/26/2005 Document Revised: 12/26/2011 Document Reviewed: 10/28/2013 [facility] Patient Information 2015 [place], [place]. This  information is not intended to replace advice given to you by your health care provider. Make sure you discuss any questions you have with your health care provider.

## 2015-01-12 NOTE — ED Provider Notes (Signed)
CSN: [id number]     Arrival date & time 01/12/15  0158 History   First MD Initiated Contact with Patient 01/12/15 0200     Chief Complaint  Patient presents with  . Back Pain     (Consider location/radiation/quality/duration/timing/severity/associated sxs/prior Treatment) Patient is a 81 y.o. female presenting with back pain. The history is provided by the patient and a relative. No language interpreter was used.  Back Pain Associated symptoms: no dysuria, no fever, no headaches and no numbness   Associated symptoms comment:  Patient with h/o HTN, high cholesterol, COPD, A-fib on Coumadin and AAA presents with low back pain for 4 days after "twisting it". No falls. No abdominal pain, N, V, dysuria, bowel/bladder incontinence, radiculopathy. She has had low back pain in the past, improved with physical therapy and medications and reports presenting pain is similar. It is worse with movement and better with certain positions. It intensifies suddenly as if spasming.    Past Medical History  Diagnosis Date  . Hyperlipidemia   . Gout   . History of small bowel obstruction   . COPD (chronic obstructive pulmonary disease)   . Substance abuse     Tobacco  . Depression   . Pulmonary hypertension   . Umbilical hernia   . Bronchitis   . Atrial fibrillation   . High cholesterol   . Pneumonia   . Arthritis   . Abdominal aortic aneurysm     followed by Dr Kellie Simmering- 4.8 cm per [place] 6/12- [facility]  . Complication of anesthesia     slow to wake up per pt- anesthesia record on chart  . Hypertension   . Anxiety   . Atherosclerosis of aorta   . Systolic heart failure     EF 40-45%    Past Surgical History  Procedure Laterality Date  . Small intestine surgery  05/2010  . Abdominal hysterectomy      Vaginal  . Hernia repair  05/2010    incarcerated Umbilical hernia  . Tonsillectomy    . Incisional hernia repair  02/21/2012    Procedure: LAPAROSCOPIC INCISIONAL HERNIA;  Surgeon: Harl Bowie,  MD;  Location: [facility];  Service: General;  Laterality: N/A;   Family History  Problem Relation Age of Onset  . Cancer Mother   . Heart disease Father   . Cancer Daughter   . Deep vein thrombosis Daughter    History  Substance Use Topics  . Smoking status: Current Every Day Smoker -- 0.50 packs/day for 50 years    Types: Cigarettes  . Smokeless tobacco: Never Used  . Alcohol Use: No   OB History    No data available     Review of Systems  Constitutional: Negative for fever and chills.  Respiratory: Negative.   Cardiovascular: Negative.   Gastrointestinal: Negative.  Negative for nausea.  Genitourinary: Negative.  Negative for dysuria, flank pain and enuresis.  Musculoskeletal: Positive for back pain.  Skin: Negative.   Neurological: Negative.  Negative for numbness and headaches.      Allergies  Hctz; Sulfa antibiotics; and Tetracyclines & related  Home Medications   Prior to Admission medications   Medication Sig Start Date End Date Taking? Authorizing Provider  acetaminophen (TYLENOL) 500 MG tablet Take 500 mg by mouth every 6 (six) hours as needed for moderate pain.   Yes Historical Provider, MD  allopurinol (ZYLOPRIM) 300 MG tablet Take 300 mg by mouth at bedtime.   Yes Historical Provider, MD  aspirin EC 81  MG EC tablet Take 1 tablet (81 mg total) by mouth daily. 09/18/14  Yes Barton Dubois, MD  citalopram (CELEXA) 20 MG tablet Take 1 tablet (20 mg total) by mouth daily. 05/01/14 05/01/15 Yes Unk Pinto, MD  diltiazem (CARDIZEM CD) 180 MG 24 hr capsule Take 1 capsule (180 mg total) by mouth daily. 11/18/14  Yes Unk Pinto, MD  fenofibrate (TRICOR) 145 MG tablet Take 1 tablet (145 mg total) by mouth daily. 11/26/14  Yes Vicie Mutters, PA-C  furosemide (LASIX) 20 MG tablet Take 20 mg by mouth every morning.   Yes Historical Provider, MD  Magnesium 250 MG TABS Take 250 mg by mouth daily.   Yes Historical Provider, MD  metoprolol succinate (TOPROL-XL) 100 MG 24  hr tablet Take 100 mg by mouth daily. Take with or immediately following a meal.   Yes Historical Provider, MD  montelukast (SINGULAIR) 10 MG tablet TAKE ONE TABLET AT BEDTIME. 12/27/14  Yes Vicie Mutters, PA-C  pantoprazole (PROTONIX) 40 MG tablet Take 1 tablet (40 mg total) by mouth daily. 11/26/14 11/26/15 Yes Vicie Mutters, PA-C  potassium chloride (K-DUR) 10 MEQ tablet Take 1 tablet (10 mEq total) by mouth 2 (two) times daily. 04/09/14  Yes Unk Pinto, MD  rosuvastatin (CRESTOR) 20 MG tablet Take 5 mg by mouth daily.   Yes Historical Provider, MD  warfarin (COUMADIN) 2.5 MG tablet Take one tablet by mouth once daily except Wednesday for anticoag/afib Patient taking differently: 2.5 mg. Mon, wed, fri then 3.75 Sun, Tues, Thur, [place] 11/18/14  Yes Unk Pinto, MD  allopurinol (ZYLOPRIM) 300 MG tablet TAKE 1 TABLET ONCE DAILY. FOR GOUT Patient not taking: Reported on 01/12/2015 01/16/14   Kelby Aline, PA-C  docusate sodium 100 MG CAPS Take 100 mg by mouth 2 (two) times daily. Patient not taking: Reported on 01/12/2015 05/16/14   Janece Canterbury, MD  fenofibrate micronized (LOFIBRA) 134 MG capsule TAKE 1 CAPSULE ONCE DAILY FOR BLOOD FATS. Patient not taking: Reported on 01/12/2015 11/18/14   Unk Pinto, MD  furosemide (LASIX) 20 MG tablet TAKE 1 TABLET 3 TIMES A DAY. Patient not taking: Reported on 01/12/2015 12/20/14   Unk Pinto, MD  rosuvastatin (CRESTOR) 20 MG tablet Take 1 tablet (20 mg total) by mouth daily. Patient not taking: Reported on 01/12/2015 11/26/14   Vicie Mutters, PA-C   BP 119/76 mmHg  Pulse 68  Temp(Src) 97.9 F (36.6 C) (Oral)  Resp 21  SpO2 95% Physical Exam  Constitutional: She is oriented to person, place, and time. She appears well-developed and well-nourished.  HENT:  Head: Normocephalic.  Neck: Normal range of motion. Neck supple.  Cardiovascular: Normal rate and regular rhythm.   Pulmonary/Chest: Effort normal and breath sounds normal.  Abdominal: Soft.  Bowel sounds are normal. There is no tenderness. There is no rebound and no guarding.  Musculoskeletal: Normal range of motion. She exhibits no edema.  Mild lumbar and paralumbar tenderness without swelling or discoloration.  Neurological: She is alert and oriented to person, place, and time. Coordination normal.  Skin: Skin is warm and dry. No rash noted.  Psychiatric: She has a normal mood and affect.    ED Course  Procedures (including critical care time) Labs Review Labs Reviewed - No data to display  Imaging Review No results found.   EKG Interpretation None      MDM   Final diagnoses:  None    1. Muscular spasm 2. Back pain  Consideration given to patient's history of AAA. There is no  abdominal pain, nausea, SOB. Doubt dissection or aneurysmal rupture. She is feeling better with medications and appears comfortable. Discussed discharge home and patient states she is comfortable with plan. She is evaluated by Dr. Leonides Schanz and is felt appropriate for discharge home.     Charlann Lange, PA-C 01/12/15 [phone]

## 2015-01-12 NOTE — ED Notes (Signed)
Bed: WA06 Expected date:  Expected time:  Means of arrival:  Comments: EMS 81 yo female back pain/spinal fx 6 months ago-lifting Saturday-pain returned-has been bed ridden since

## 2015-01-12 NOTE — ED Provider Notes (Signed)
Medical screening examination/treatment/procedure(s) were conducted as a shared visit with non-physician practitioner(s) and myself.  I personally evaluated the patient during the encounter.   EKG Interpretation None      Pt is a 81 y.o. female with history of hypertension, hyperlipidemia, COPD, atrial fibrillation on Coumadin, AAA who presents emergency room with back pain. Reports 3 days ago she was lifting a window when she felt a pop in her back and has had spasming in her back since. Denies chest pain or abdominal pain. No numbness, tingling or focal weakness. No fever. No bowel or bladder incontinence or urinary retention. Pain is worse with movement and better with sitting still. Has had similar back pain in the past which was improved with Tylenol. States Tylenol is not helping her pain today. On exam, patient is elderly, smiling, laughing, no significant distress. She has no midline spinal tenderness and no step-off or deformity. She is tender over the thoracic and lumbar paraspinal musculature with associated spasm especially on the right side. She moves all upper extremity equally, no saddle anesthesia, normal sensation diffusely, no clonus.  Her abdominal exam is completely benign. Heart and lung sounds are normal. She has 2+ DP and radial pulses bilaterally. Have discussed with patient and daughter at bedside that I do not think this is a dissection or worsening AAA may think this is muscle spasm, strain. They agree. She has no midline spinal tenderness to suggest that she needs x-rays at this time. She is neurologically intact. No red flag symptoms. She has been given ibuprofen and tramadol in the ED and reports feeling much better. I have discussed with him strict return precautions. Patient and daughter at bedside verbalize understanding and are comfortable with this plan.  [place], DO 01/12/15 [phone]

## 2015-01-12 NOTE — ED Notes (Signed)
Patient states that on Saturday she was trying to close a window and heard something pop. She states she has been in pain every since in her lower back. Patient states she has the most pain when getting up from a sitting position and that tonight was no different from Saturday, but she was just tired of hurting. Patient was not able to get out of bed and had to be moved by EMS upon their arrival. Patient states that she has been ambulatory since Saturday, but just could not get up tonight. She suffered a fall back in October where she was told she fractured two vertebrae but no falls or injuries since then. Patient has been taking tylenol for pain.

## 2015-01-16 ENCOUNTER — Emergency Department (HOSPITAL_COMMUNITY): Payer: Medicare Other

## 2015-01-16 ENCOUNTER — Emergency Department (HOSPITAL_COMMUNITY)
Admission: EM | Admit: 2015-01-16 | Discharge: 2015-01-16 | Disposition: A | Discharge status: Home / Self Care | Payer: Medicare Other | Attending: Emergency Medicine | Admitting: Emergency Medicine

## 2015-01-16 ENCOUNTER — Encounter (HOSPITAL_COMMUNITY): Payer: Self-pay | Admitting: Emergency Medicine

## 2015-01-16 DIAGNOSIS — J449 Chronic obstructive pulmonary disease, unspecified: Secondary | ICD-10-CM | POA: Diagnosis not present

## 2015-01-16 DIAGNOSIS — I5020 Unspecified systolic (congestive) heart failure: Secondary | ICD-10-CM | POA: Diagnosis not present

## 2015-01-16 DIAGNOSIS — Z7982 Long term (current) use of aspirin: Secondary | ICD-10-CM | POA: Diagnosis not present

## 2015-01-16 DIAGNOSIS — I10 Essential (primary) hypertension: Secondary | ICD-10-CM | POA: Insufficient documentation

## 2015-01-16 DIAGNOSIS — F419 Anxiety disorder, unspecified: Secondary | ICD-10-CM | POA: Insufficient documentation

## 2015-01-16 DIAGNOSIS — Z8701 Personal history of pneumonia (recurrent): Secondary | ICD-10-CM | POA: Insufficient documentation

## 2015-01-16 DIAGNOSIS — M1990 Unspecified osteoarthritis, unspecified site: Secondary | ICD-10-CM | POA: Diagnosis not present

## 2015-01-16 DIAGNOSIS — E780 Pure hypercholesterolemia: Secondary | ICD-10-CM | POA: Diagnosis not present

## 2015-01-16 DIAGNOSIS — M109 Gout, unspecified: Secondary | ICD-10-CM | POA: Insufficient documentation

## 2015-01-16 DIAGNOSIS — N289 Disorder of kidney and ureter, unspecified: Secondary | ICD-10-CM | POA: Insufficient documentation

## 2015-01-16 DIAGNOSIS — R29810 Facial weakness: Secondary | ICD-10-CM | POA: Insufficient documentation

## 2015-01-16 DIAGNOSIS — Z79899 Other long term (current) drug therapy: Secondary | ICD-10-CM | POA: Diagnosis not present

## 2015-01-16 DIAGNOSIS — Z720 Tobacco use: Secondary | ICD-10-CM | POA: Diagnosis not present

## 2015-01-16 DIAGNOSIS — Z7901 Long term (current) use of anticoagulants: Secondary | ICD-10-CM | POA: Diagnosis not present

## 2015-01-16 DIAGNOSIS — E785 Hyperlipidemia, unspecified: Secondary | ICD-10-CM | POA: Insufficient documentation

## 2015-01-16 DIAGNOSIS — F329 Major depressive disorder, single episode, unspecified: Secondary | ICD-10-CM | POA: Diagnosis not present

## 2015-01-16 LAB — RAPID URINE DRUG SCREEN, HOSP PERFORMED
AMPHETAMINES: NOT DETECTED
BARBITURATES: NOT DETECTED
BENZODIAZEPINES: NOT DETECTED
Cocaine: NOT DETECTED
Opiates: NOT DETECTED
TETRAHYDROCANNABINOL: NOT DETECTED

## 2015-01-16 LAB — URINE MICROSCOPIC-ADD ON

## 2015-01-16 LAB — DIFFERENTIAL
Basophils Absolute: 0 10*3/uL (ref 0.0–0.1)
Basophils Relative: 0 % (ref 0–1)
EOS ABS: 0.3 10*3/uL (ref 0.0–0.7)
Eosinophils Relative: 4 % (ref 0–5)
LYMPHS ABS: 1.9 10*3/uL (ref 0.7–4.0)
Lymphocytes Relative: 27 % (ref 12–46)
Monocytes Absolute: 0.4 10*3/uL (ref 0.1–1.0)
Monocytes Relative: 6 % (ref 3–12)
NEUTROS PCT: 63 % (ref 43–77)
Neutro Abs: 4.5 10*3/uL (ref 1.7–7.7)

## 2015-01-16 LAB — I-STAT CHEM 8, ED
BUN: 42 mg/dL — AB (ref 6–20)
CHLORIDE: 74 mmol/L — AB (ref 101–111)
Calcium, Ion: 1.13 mmol/L (ref 1.13–1.30)
Creatinine, Ser: 1.6 mg/dL — ABNORMAL HIGH (ref 0.44–1.00)
Glucose, Bld: 92 mg/dL (ref 65–99)
HCT: 41 % (ref 36.0–46.0)
Hemoglobin: 13.9 g/dL (ref 12.0–15.0)
POTASSIUM: 4.9 mmol/L (ref 3.5–5.1)
Sodium: 135 mmol/L (ref 135–145)
TCO2: 28 mmol/L (ref 0–100)

## 2015-01-16 LAB — PROTIME-INR
INR: 2.64 — ABNORMAL HIGH (ref 0.00–1.49)
PROTHROMBIN TIME: 27.8 s — AB (ref 11.6–15.2)

## 2015-01-16 LAB — CBC
HEMATOCRIT: 38.5 % (ref 36.0–46.0)
Hemoglobin: 13 g/dL (ref 12.0–15.0)
MCH: 32.3 pg (ref 26.0–34.0)
MCHC: 33.8 g/dL (ref 30.0–36.0)
MCV: 95.8 fL (ref 78.0–100.0)
Platelets: 218 10*3/uL (ref 150–400)
RBC: 4.02 MIL/uL (ref 3.87–5.11)
RDW: 14.1 % (ref 11.5–15.5)
WBC: 7.2 10*3/uL (ref 4.0–10.5)

## 2015-01-16 LAB — COMPREHENSIVE METABOLIC PANEL
ALBUMIN: 3.1 g/dL — AB (ref 3.5–5.0)
ALK PHOS: 68 U/L (ref 38–126)
ALT: 11 U/L — ABNORMAL LOW (ref 14–54)
ANION GAP: 11 (ref 5–15)
AST: 20 U/L (ref 15–41)
BILIRUBIN TOTAL: 0.6 mg/dL (ref 0.3–1.2)
BUN: 26 mg/dL — AB (ref 6–20)
CO2: 26 mmol/L (ref 22–32)
CREATININE: 1.72 mg/dL — AB (ref 0.44–1.00)
Calcium: 8.9 mg/dL (ref 8.9–10.3)
Chloride: 98 mmol/L — ABNORMAL LOW (ref 101–111)
GFR, EST AFRICAN AMERICAN: 30 mL/min — AB (ref >60)
GFR, EST NON AFRICAN AMERICAN: 26 mL/min — AB (ref >60)
Glucose, Bld: 103 mg/dL — ABNORMAL HIGH (ref 65–99)
Potassium: 4.1 mmol/L (ref 3.5–5.1)
Sodium: 135 mmol/L (ref 135–145)
Total Protein: 6.4 g/dL — ABNORMAL LOW (ref 6.5–8.1)

## 2015-01-16 LAB — URINALYSIS, ROUTINE W REFLEX MICROSCOPIC
Bilirubin Urine: NEGATIVE
Glucose, UA: NEGATIVE mg/dL
Hgb urine dipstick: NEGATIVE
Ketones, ur: NEGATIVE mg/dL
NITRITE: NEGATIVE
Protein, ur: NEGATIVE mg/dL
Specific Gravity, Urine: 1.01 (ref 1.005–1.030)
UROBILINOGEN UA: 0.2 mg/dL (ref 0.0–1.0)
pH: 5 (ref 5.0–8.0)

## 2015-01-16 LAB — APTT: aPTT: 52 seconds — ABNORMAL HIGH (ref 24–37)

## 2015-01-16 LAB — ETHANOL: Alcohol, Ethyl (B): <5 mg/dL (ref <5)

## 2015-01-16 LAB — I-STAT TROPONIN, ED: TROPONIN I, POC: 0.01 ng/mL (ref 0.00–0.08)

## 2015-01-16 MED ORDER — SODIUM CHLORIDE 0.9 % IV BOLUS (SEPSIS)
500.0000 mL | Freq: Once | INTRAVENOUS | Status: AC
  Administered 2015-01-16: 500 mL via INTRAVENOUS

## 2015-01-16 NOTE — Discharge Instructions (Signed)
Stop taking tramadol. As we discussed, it is impossible to tell whether you've had a stroke without an MRI. Follow up with your doctor. You need to have your kidney function checked next week. Return to the ED if he develop chest pain, shortness of breath, dizziness, lightheadedness or any other concerns.

## 2015-01-16 NOTE — ED Notes (Signed)
Ambulated with assistance (usually uses walker). Tolerated well. Denied dizziness or feeling unsteady.

## 2015-01-16 NOTE — ED Notes (Addendum)
Daughter arrived to independent living this morning and noted a left facial droop and a period of confusion during which patient was talking about letting a dog out and she does not have a dog. No facial droop noted on arrival. A & O on arrival. Equal strength bilaterally all extremities. Patient has fallen this week twice due to feeling like she is leaning to the left. Denies any injury from this morning's fall. Daughter reports she has not taken her medications this week. CBG 132 in route.

## 2015-01-16 NOTE — ED Notes (Signed)
Patient returned from Radiology.

## 2015-01-16 NOTE — ED Provider Notes (Addendum)
CSN: [id number]     Arrival date & time 01/16/15  0908 History   First MD Initiated Contact with Patient 01/16/15 [phone]     Chief Complaint  Patient presents with  . Facial Droop     (Consider location/radiation/quality/duration/timing/severity/associated sxs/prior Treatment) HPI Comments: Patient's daughter concerned that mother had left facial droop that she noticed this morning. Last seen normal last night. No facial droop noted on arrival. Daughter states mother has had frequent falls over the past several days and she feels like she is leaning to the left. She denies any injury from the falls. She denies hitting her head. Denies any dizziness or lightheadedness. No chest pain or shortness of breath. Daughter reports patient's had intermittent hallucinations for the past 2 days. She was recently prescribed tramadol for back pain. She states her back pain is improving. No focal weakness, numbness or tingling. No bowel or bladder incontinence. Patient on Coumadin for history of atrial fibrillation. Daughter feels mother is not taken medications for the past week.  The history is provided by the patient, the EMS personnel and a relative.    Past Medical History  Diagnosis Date  . Hyperlipidemia   . Gout   . History of small bowel obstruction   . COPD (chronic obstructive pulmonary disease)   . Substance abuse     Tobacco  . Depression   . Pulmonary hypertension   . Umbilical hernia   . Bronchitis   . Atrial fibrillation   . High cholesterol   . Pneumonia   . Arthritis   . Abdominal aortic aneurysm     followed by Dr Kellie Simmering- 4.8 cm per [place] 6/12- [facility]  . Complication of anesthesia     slow to wake up per pt- anesthesia record on chart  . Hypertension   . Anxiety   . Atherosclerosis of aorta   . Systolic heart failure     EF 40-45%    Past Surgical History  Procedure Laterality Date  . Small intestine surgery  05/2010  . Abdominal hysterectomy      Vaginal  . Hernia repair   05/2010    incarcerated Umbilical hernia  . Tonsillectomy    . Incisional hernia repair  02/21/2012    Procedure: LAPAROSCOPIC INCISIONAL HERNIA;  Surgeon: Harl Bowie, MD;  Location: [facility];  Service: General;  Laterality: N/A;   Family History  Problem Relation Age of Onset  . Cancer Mother   . Heart disease Father   . Cancer Daughter   . Deep vein thrombosis Daughter    History  Substance Use Topics  . Smoking status: Current Every Day Smoker -- 0.50 packs/day for 50 years    Types: Cigarettes  . Smokeless tobacco: Never Used  . Alcohol Use: No   OB History    No data available     Review of Systems  Constitutional: Negative for fever, activity change, appetite change and fatigue.  HENT: Negative for congestion and rhinorrhea.   Respiratory: Negative for cough, chest tightness and shortness of breath.   Cardiovascular: Negative for chest pain.  Gastrointestinal: Negative for nausea, vomiting and abdominal pain.  Genitourinary: Negative for dysuria, vaginal bleeding and vaginal discharge.  Musculoskeletal: Negative for myalgias and arthralgias.  Skin: Negative for rash.  Neurological: Positive for dizziness and facial asymmetry.  A complete 10 system review of systems was obtained and all systems are negative except as noted in the HPI and PMH.      Allergies  Hctz; Sulfa  antibiotics; and Tetracyclines & related  Home Medications   Prior to Admission medications   Medication Sig Start Date End Date Taking? Authorizing Provider  acetaminophen (TYLENOL) 500 MG tablet Take 500 mg by mouth every 6 (six) hours as needed for moderate pain.   Yes Historical Provider, MD  allopurinol (ZYLOPRIM) 300 MG tablet TAKE 1 TABLET ONCE DAILY. FOR GOUT 01/12/15  Yes Vicie Mutters, PA-C  aspirin EC 81 MG EC tablet Take 1 tablet (81 mg total) by mouth daily. 09/18/14  Yes Barton Dubois, MD  citalopram (CELEXA) 20 MG tablet Take 1 tablet (20 mg total) by mouth daily. 05/01/14  05/01/15 Yes Unk Pinto, MD  diltiazem (CARDIZEM CD) 180 MG 24 hr capsule Take 1 capsule (180 mg total) by mouth daily. 11/18/14  Yes Unk Pinto, MD  fenofibrate micronized (LOFIBRA) 134 MG capsule TAKE 1 CAPSULE ONCE DAILY FOR BLOOD FATS. 11/18/14  Yes Unk Pinto, MD  Magnesium 250 MG TABS Take 250 mg by mouth daily.   Yes Historical Provider, MD  metoprolol succinate (TOPROL-XL) 100 MG 24 hr tablet TAKE 1 TABLET ONCE DAILY WITH A MEAL. 01/12/15  Yes Vicie Mutters, PA-C  montelukast (SINGULAIR) 10 MG tablet TAKE ONE TABLET AT BEDTIME. 12/27/14  Yes Vicie Mutters, PA-C  pantoprazole (PROTONIX) 40 MG tablet Take 1 tablet (40 mg total) by mouth daily. 11/26/14 11/26/15 Yes Vicie Mutters, PA-C  potassium chloride (K-DUR) 10 MEQ tablet Take 1 tablet (10 mEq total) by mouth 2 (two) times daily. 04/09/14  Yes Unk Pinto, MD  rosuvastatin (CRESTOR) 20 MG tablet Take 5 mg by mouth daily.   Yes Historical Provider, MD  traMADol (ULTRAM) 50 MG tablet Take 1 tablet (50 mg total) by mouth every 6 (six) hours as needed. 01/12/15  Yes Charlann Lange, PA-C  warfarin (COUMADIN) 2.5 MG tablet Take 2.5-3.75 mg by mouth daily. Take 1 tab (2.63m) Mon, Wed, Fri, and 1 1/2 tab (3.729mon Tues, Thurs, Sat, Sun   Yes Historical Provider, MD  docusate sodium 100 MG CAPS Take 100 mg by mouth 2 (two) times daily. Patient not taking: Reported on 01/12/2015 05/16/14   MaJanece CanterburyMD  furosemide (LASIX) 20 MG tablet TAKE 1 TABLET 3 TIMES A DAY. Patient not taking: Reported on 01/16/2015 12/20/14   WiUnk PintoMD  furosemide (LASIX) 20 MG tablet Take 20 mg by mouth every morning.    Historical Provider, MD  rosuvastatin (CRESTOR) 20 MG tablet Take 1 tablet (20 mg total) by mouth daily. Patient not taking: Reported on 01/12/2015 11/26/14   AmVicie MuttersPA-C  warfarin (COUMADIN) 2.5 MG tablet TAKE 1 TABLET DAILY EXCEPT [facility] not taking: Reported on 01/16/2015 01/12/15   AmVicie MuttersPA-C   BP 152/59  mmHg  Pulse 56  Temp(Src) 97.9 F (36.6 C) (Oral)  Resp 14  Ht 5' 5.5" (1.664 m)  Wt 180 lb (81.647 kg)  BMI 29.49 kg/m2  SpO2 98% Physical Exam  Constitutional: She is oriented to person, place, and time. She appears well-developed and well-nourished. No distress.  HENT:  Head: Normocephalic and atraumatic.  Mouth/Throat: Oropharynx is clear and moist. No oropharyngeal exudate.  Eyes: Conjunctivae and EOM are normal. Pupils are equal, round, and reactive to light.  Neck: Normal range of motion. Neck supple.  No meningismus.  Cardiovascular: Normal rate, regular rhythm, normal heart sounds and intact distal pulses.   No murmur heard. Pulmonary/Chest: Effort normal and breath sounds normal. No respiratory distress.  Abdominal: Soft. There is no tenderness. There is  no rebound and no guarding.  Musculoskeletal: Normal range of motion. She exhibits no edema or tenderness.  Neurological: She is alert and oriented to person, place, and time. No cranial nerve deficit. She exhibits normal muscle tone. Coordination normal.  No appreciable facial droop. Tongue is midline. No ataxia on finger to nose bilaterally. No pronator drift. 5/5 strength throughout. CN 2-12 intact. Positive Romberg. Equal grip strength. Sensation intact. Gait is not tested.  Skin: Skin is warm.  Psychiatric: She has a normal mood and affect. Her behavior is normal.  Nursing note and vitals reviewed.   ED Course  Procedures (including critical care time) Labs Review Labs Reviewed  PROTIME-INR - Abnormal; Notable for the following:    Prothrombin Time 27.8 (*)    INR 2.64 (*)    All other components within normal limits  APTT - Abnormal; Notable for the following:    aPTT 52 (*)    All other components within normal limits  COMPREHENSIVE METABOLIC PANEL - Abnormal; Notable for the following:    Chloride 98 (*)    Glucose, Bld 103 (*)    BUN 26 (*)    Creatinine, Ser 1.72 (*)    Total Protein 6.4 (*)     Albumin 3.1 (*)    ALT 11 (*)    GFR calc non Af Amer 26 (*)    GFR calc Af Amer 30 (*)    All other components within normal limits  URINALYSIS, ROUTINE W REFLEX MICROSCOPIC (NOT AT [facility]) - Abnormal; Notable for the following:    Leukocytes, UA TRACE (*)    All other components within normal limits  URINE MICROSCOPIC-ADD ON - Abnormal; Notable for the following:    Squamous Epithelial / LPF FEW (*)    All other components within normal limits  I-STAT CHEM 8, ED - Abnormal; Notable for the following:    Chloride 74 (*)    BUN 42 (*)    Creatinine, Ser 1.60 (*)    All other components within normal limits  ETHANOL  CBC  DIFFERENTIAL  URINE RAPID DRUG SCREEN, HOSP PERFORMED  I-STAT TROPOININ, ED    Imaging Review Dg Chest 2 View  01/16/2015   CLINICAL DATA:  Patient with left facial droop status post nursing home visit. Confusion. Multiple falls.  EXAM: CHEST  2 VIEW  COMPARISON:  Chest radiograph 09/16/2014  FINDINGS: Patient is rotated to the left. Stable enlarged cardiac and mediastinal contours. Markedly tortuous aorta. No consolidative pulmonary opacities. Minimal heterogeneous opacities left lung base. No pleural effusion or pneumothorax. Suggestion of possible linear lucency distal left clavicle.  IMPRESSION: Possible linear lucency distal left clavicle. Consider correlation with dedicated shoulder radiographs if patient is focally tender in this location to exclude nondisplaced fracture.  Cardiomegaly.  Left basilar atelectasis.   Electronically Signed   By: Lovey Newcomer M.D.   On: 01/16/2015 10:19   Dg Clavicle Left  01/16/2015   CLINICAL DATA:  Fall, abnormal chest radiograph  EXAM: LEFT CLAVICLE - 2+ VIEWS  COMPARISON:  None.  FINDINGS: No displaced clavicle fracture is seen.  Degenerative changes at the acromioclavicular joint.  Cutaneous lead overlying the distal 3rd of the clavicle.  IMPRESSION: No fracture or dislocation is seen.   Electronically Signed   By: Julian Hy  M.D.   On: 01/16/2015 12:14   Ct Head Wo Contrast  01/16/2015   CLINICAL DATA:  Left facial droop, confusion, fall  EXAM: CT HEAD WITHOUT CONTRAST  TECHNIQUE: Contiguous axial images  were obtained from the base of the skull through the vertex without intravenous contrast.  COMPARISON:  09/15/2014  FINDINGS: No evidence of parenchymal hemorrhage or extra-axial fluid collection. No mass lesion, mass effect, or midline shift.  No CT evidence of acute infarction.  Subcortical white matter and periventricular small vessel ischemic changes.  Global cortical and central atrophy. Secondary ventricular prominence.  The visualized paranasal sinuses are essentially clear. The mastoid air cells are unopacified.  No evidence of calvarial fracture.  IMPRESSION: No evidence of acute intracranial abnormality.  Atrophy with small vessel ischemic changes.   Electronically Signed   By: Julian Hy M.D.   On: 01/16/2015 11:35     EKG Interpretation   Date/Time:  Saturday January 16 2015 09:13:31 EDT Ventricular Rate:  48 PR Interval:  265 QRS Duration: 101 QT Interval:  526 QTC Calculation: 470 R Axis:   -9 Text Interpretation:  Sinus or ectopic atrial bradycardia Prolonged PR  interval Probable left atrial enlargement RSR' in V1 or V2, probably  normal variant Minimal ST elevation, lateral leads No significant change  was found Artifact Confirmed by Wyvonnia Dusky  MD, Analina Filla [phone]) on 01/16/2015  9:35:52 AM      MDM   Final diagnoses:  Facial droop  Renal insufficiency   daughter concern for facial droop, frequent falls, leaning to the left, intermittent confusion over the past 2 days. Recently started on tramadol for back pain. Denies any headache or dizziness. No focal deficits on arrival other than positive Romberg. No facial droop on arrival. Hx AAA 5 cm in 2014, she has refused repair in the past.  Cr 1.6 which is not far from baseline. INR 2.6  CT head without acute change. No hemorrhage. Patient  ambulatory with walker and denies dizziness or feeling unsteady.  Patient adamantly refuses MRI. She states she cannot tolerate it even with sedation medications. Patient admitted in March and had a TIA workup including carotid Dopplers and echocardiogram. Her INR is therapeutic today.  Discussed with Dr. Nicole Kindred. He agrees he would prefer patient have MRI but she is already on maximal therapy for stroke prevention. He does not think she needs to be admitted for repeat workup. Patient ambulatory with walker which is her baseline. She denies any dizziness or lightheadedness. No facial droop. Instructed to stop tramadol this may be contributing to her confusion and difficulty walking.  Patient and daughter understand that is impossible to tell whether she had a stroke or not without MRI. Patient adamantly refuses even was any type of sedation.  Advised to stop tramadol as it may be contributing to her confusion. She needs a recheck of her creatinine with her PCP next week. She is given IV fluids in the ED by mouth fluids. Return precautions discussed.   Ezequiel Essex, MD 01/16/15 1333  Ezequiel Essex, MD 01/16/15 [phone]

## 2015-01-16 NOTE — ED Notes (Signed)
Patient transported to CT

## 2015-01-16 NOTE — ED Notes (Signed)
Patient transported to X-ray 

## 2015-01-17 ENCOUNTER — Encounter (HOSPITAL_COMMUNITY): Payer: Self-pay | Admitting: Emergency Medicine

## 2015-01-17 ENCOUNTER — Emergency Department (HOSPITAL_COMMUNITY): Payer: Medicare Other

## 2015-01-17 ENCOUNTER — Observation Stay (HOSPITAL_COMMUNITY)
Admission: EM | Admit: 2015-01-17 | Discharge: 2015-01-18 | Disposition: A | Discharge status: Skilled Nursing Facility | Payer: Medicare Other | Attending: Internal Medicine | Admitting: Internal Medicine

## 2015-01-17 DIAGNOSIS — N189 Chronic kidney disease, unspecified: Secondary | ICD-10-CM | POA: Insufficient documentation

## 2015-01-17 DIAGNOSIS — M4856XA Collapsed vertebra, not elsewhere classified, lumbar region, initial encounter for fracture: Principal | ICD-10-CM | POA: Insufficient documentation

## 2015-01-17 DIAGNOSIS — N183 Chronic kidney disease, stage 3 (moderate): Secondary | ICD-10-CM | POA: Diagnosis not present

## 2015-01-17 DIAGNOSIS — R599 Enlarged lymph nodes, unspecified: Secondary | ICD-10-CM | POA: Insufficient documentation

## 2015-01-17 DIAGNOSIS — Z881 Allergy status to other antibiotic agents status: Secondary | ICD-10-CM | POA: Diagnosis not present

## 2015-01-17 DIAGNOSIS — R260 Ataxic gait: Secondary | ICD-10-CM

## 2015-01-17 DIAGNOSIS — I739 Peripheral vascular disease, unspecified: Secondary | ICD-10-CM | POA: Insufficient documentation

## 2015-01-17 DIAGNOSIS — I5042 Chronic combined systolic (congestive) and diastolic (congestive) heart failure: Secondary | ICD-10-CM | POA: Insufficient documentation

## 2015-01-17 DIAGNOSIS — I272 Other secondary pulmonary hypertension: Secondary | ICD-10-CM | POA: Insufficient documentation

## 2015-01-17 DIAGNOSIS — S32009A Unspecified fracture of unspecified lumbar vertebra, initial encounter for closed fracture: Secondary | ICD-10-CM

## 2015-01-17 DIAGNOSIS — Y9389 Activity, other specified: Secondary | ICD-10-CM | POA: Diagnosis not present

## 2015-01-17 DIAGNOSIS — F17210 Nicotine dependence, cigarettes, uncomplicated: Secondary | ICD-10-CM | POA: Insufficient documentation

## 2015-01-17 DIAGNOSIS — M5136 Other intervertebral disc degeneration, lumbar region: Secondary | ICD-10-CM | POA: Diagnosis not present

## 2015-01-17 DIAGNOSIS — R296 Repeated falls: Secondary | ICD-10-CM | POA: Diagnosis not present

## 2015-01-17 DIAGNOSIS — W19XXXA Unspecified fall, initial encounter: Secondary | ICD-10-CM | POA: Diagnosis not present

## 2015-01-17 DIAGNOSIS — M8580 Other specified disorders of bone density and structure, unspecified site: Secondary | ICD-10-CM | POA: Insufficient documentation

## 2015-01-17 DIAGNOSIS — Y998 Other external cause status: Secondary | ICD-10-CM | POA: Diagnosis not present

## 2015-01-17 DIAGNOSIS — E785 Hyperlipidemia, unspecified: Secondary | ICD-10-CM | POA: Diagnosis not present

## 2015-01-17 DIAGNOSIS — M25551 Pain in right hip: Secondary | ICD-10-CM | POA: Diagnosis not present

## 2015-01-17 DIAGNOSIS — M109 Gout, unspecified: Secondary | ICD-10-CM | POA: Diagnosis not present

## 2015-01-17 DIAGNOSIS — I10 Essential (primary) hypertension: Secondary | ICD-10-CM | POA: Diagnosis present

## 2015-01-17 DIAGNOSIS — I129 Hypertensive chronic kidney disease with stage 1 through stage 4 chronic kidney disease, or unspecified chronic kidney disease: Secondary | ICD-10-CM | POA: Insufficient documentation

## 2015-01-17 DIAGNOSIS — J449 Chronic obstructive pulmonary disease, unspecified: Secondary | ICD-10-CM | POA: Diagnosis not present

## 2015-01-17 DIAGNOSIS — Z7982 Long term (current) use of aspirin: Secondary | ICD-10-CM | POA: Insufficient documentation

## 2015-01-17 DIAGNOSIS — Z882 Allergy status to sulfonamides status: Secondary | ICD-10-CM | POA: Insufficient documentation

## 2015-01-17 DIAGNOSIS — J9621 Acute and chronic respiratory failure with hypoxia: Secondary | ICD-10-CM

## 2015-01-17 DIAGNOSIS — K219 Gastro-esophageal reflux disease without esophagitis: Secondary | ICD-10-CM | POA: Insufficient documentation

## 2015-01-17 DIAGNOSIS — R531 Weakness: Secondary | ICD-10-CM | POA: Insufficient documentation

## 2015-01-17 DIAGNOSIS — I482 Chronic atrial fibrillation, unspecified: Secondary | ICD-10-CM | POA: Diagnosis present

## 2015-01-17 DIAGNOSIS — Z9181 History of falling: Secondary | ICD-10-CM

## 2015-01-17 DIAGNOSIS — S32000A Wedge compression fracture of unspecified lumbar vertebra, initial encounter for closed fracture: Secondary | ICD-10-CM | POA: Diagnosis present

## 2015-01-17 DIAGNOSIS — Z888 Allergy status to other drugs, medicaments and biological substances status: Secondary | ICD-10-CM | POA: Diagnosis not present

## 2015-01-17 DIAGNOSIS — Z7901 Long term (current) use of anticoagulants: Secondary | ICD-10-CM | POA: Insufficient documentation

## 2015-01-17 DIAGNOSIS — M25559 Pain in unspecified hip: Secondary | ICD-10-CM

## 2015-01-17 DIAGNOSIS — R410 Disorientation, unspecified: Secondary | ICD-10-CM | POA: Insufficient documentation

## 2015-01-17 DIAGNOSIS — Y92018 Other place in single-family (private) house as the place of occurrence of the external cause: Secondary | ICD-10-CM | POA: Diagnosis not present

## 2015-01-17 DIAGNOSIS — I5022 Chronic systolic (congestive) heart failure: Secondary | ICD-10-CM

## 2015-01-17 LAB — CBC WITH DIFFERENTIAL/PLATELET
Basophils Absolute: 0 10*3/uL (ref 0.0–0.1)
Basophils Relative: 0 % (ref 0–1)
Eosinophils Absolute: 0.3 10*3/uL (ref 0.0–0.7)
Eosinophils Relative: 4 % (ref 0–5)
HCT: 37.8 % (ref 36.0–46.0)
Hemoglobin: 12.7 g/dL (ref 12.0–15.0)
Lymphocytes Relative: 24 % (ref 12–46)
Lymphs Abs: 2.1 10*3/uL (ref 0.7–4.0)
MCH: 32.5 pg (ref 26.0–34.0)
MCHC: 33.6 g/dL (ref 30.0–36.0)
MCV: 96.7 fL (ref 78.0–100.0)
MONOS PCT: 8 % (ref 3–12)
Monocytes Absolute: 0.7 10*3/uL (ref 0.1–1.0)
NEUTROS ABS: 5.7 10*3/uL (ref 1.7–7.7)
NEUTROS PCT: 64 % (ref 43–77)
Platelets: 241 10*3/uL (ref 150–400)
RBC: 3.91 MIL/uL (ref 3.87–5.11)
RDW: 14.1 % (ref 11.5–15.5)
WBC: 8.9 10*3/uL (ref 4.0–10.5)

## 2015-01-17 LAB — HEPATIC FUNCTION PANEL
ALBUMIN: 3.3 g/dL — AB (ref 3.5–5.0)
ALT: 11 U/L — ABNORMAL LOW (ref 14–54)
AST: 22 U/L (ref 15–41)
Alkaline Phosphatase: 64 U/L (ref 38–126)
Bilirubin, Direct: 0.2 mg/dL (ref 0.1–0.5)
Indirect Bilirubin: 0.5 mg/dL (ref 0.3–0.9)
TOTAL PROTEIN: 6.5 g/dL (ref 6.5–8.1)
Total Bilirubin: 0.7 mg/dL (ref 0.3–1.2)

## 2015-01-17 LAB — BASIC METABOLIC PANEL
Anion gap: 8 (ref 5–15)
BUN: 28 mg/dL — AB (ref 6–20)
CHLORIDE: 104 mmol/L (ref 101–111)
CO2: 25 mmol/L (ref 22–32)
Calcium: 8.6 mg/dL — ABNORMAL LOW (ref 8.9–10.3)
Creatinine, Ser: 1.58 mg/dL — ABNORMAL HIGH (ref 0.44–1.00)
GFR calc Af Amer: 34 mL/min — ABNORMAL LOW (ref >60)
GFR calc non Af Amer: 29 mL/min — ABNORMAL LOW (ref >60)
GLUCOSE: 100 mg/dL — AB (ref 65–99)
POTASSIUM: 4.2 mmol/L (ref 3.5–5.1)
Sodium: 137 mmol/L (ref 135–145)

## 2015-01-17 LAB — TROPONIN I

## 2015-01-17 LAB — PROTIME-INR
INR: 2.58 — ABNORMAL HIGH (ref 0.00–1.49)
PROTHROMBIN TIME: 27.3 s — AB (ref 11.6–15.2)

## 2015-01-17 MED ORDER — ASPIRIN EC 81 MG PO TBEC
81.0000 mg | DELAYED_RELEASE_TABLET | Freq: Every day | ORAL | Status: DC
  Administered 2015-01-17 – 2015-01-18 (×2): 81 mg via ORAL
  Filled 2015-01-17 (×2): qty 1

## 2015-01-17 MED ORDER — FENTANYL CITRATE (PF) 100 MCG/2ML IJ SOLN
50.0000 ug | Freq: Once | INTRAMUSCULAR | Status: AC
  Administered 2015-01-17: 50 ug via INTRAVENOUS
  Filled 2015-01-17: qty 2

## 2015-01-17 MED ORDER — ACETAMINOPHEN 325 MG PO TABS
650.0000 mg | ORAL_TABLET | Freq: Four times a day (QID) | ORAL | Status: DC | PRN
  Administered 2015-01-17 – 2015-01-18 (×3): 650 mg via ORAL
  Filled 2015-01-17 (×3): qty 2

## 2015-01-17 MED ORDER — WARFARIN - PHARMACIST DOSING INPATIENT: Freq: Every day | Status: DC

## 2015-01-17 MED ORDER — ENSURE ENLIVE PO LIQD
237.0000 mL | Freq: Two times a day (BID) | ORAL | Status: DC
  Administered 2015-01-17: 237 mL via ORAL

## 2015-01-17 MED ORDER — DILTIAZEM HCL 60 MG PO TABS
60.0000 mg | ORAL_TABLET | Freq: Three times a day (TID) | ORAL | Status: DC
  Administered 2015-01-17 – 2015-01-18 (×4): 60 mg via ORAL
  Filled 2015-01-17 (×4): qty 1

## 2015-01-17 MED ORDER — WARFARIN SODIUM 2.5 MG PO TABS
3.7500 mg | ORAL_TABLET | Freq: Once | ORAL | Status: AC
  Administered 2015-01-17: 3.75 mg via ORAL
  Filled 2015-01-17: qty 1

## 2015-01-17 MED ORDER — SODIUM CHLORIDE 0.9 % IV SOLN
INTRAVENOUS | Status: AC
  Administered 2015-01-17: 06:00:00 via INTRAVENOUS

## 2015-01-17 MED ORDER — ONDANSETRON HCL 4 MG/2ML IJ SOLN: 4.0000 mg | Freq: Four times a day (QID) | INTRAMUSCULAR | Status: DC | PRN

## 2015-01-17 MED ORDER — LIDOCAINE 5 % EX PTCH
2.0000 | MEDICATED_PATCH | CUTANEOUS | Status: DC
  Administered 2015-01-17 – 2015-01-18 (×2): 2 via TRANSDERMAL
  Filled 2015-01-17 (×2): qty 2

## 2015-01-17 MED ORDER — CITALOPRAM HYDROBROMIDE 20 MG PO TABS
20.0000 mg | ORAL_TABLET | Freq: Every day | ORAL | Status: DC
  Administered 2015-01-17 – 2015-01-18 (×2): 20 mg via ORAL
  Filled 2015-01-17 (×2): qty 1

## 2015-01-17 MED ORDER — ONDANSETRON HCL 4 MG PO TABS: 4.0000 mg | ORAL_TABLET | Freq: Four times a day (QID) | ORAL | Status: DC | PRN

## 2015-01-17 MED ORDER — ACETAMINOPHEN 650 MG RE SUPP: 650.0000 mg | Freq: Four times a day (QID) | RECTAL | Status: DC | PRN

## 2015-01-17 MED ORDER — CETYLPYRIDINIUM CHLORIDE 0.05 % MT LIQD: 7.0000 mL | Freq: Two times a day (BID) | OROMUCOSAL | Status: DC

## 2015-01-17 MED ORDER — METOPROLOL TARTRATE 50 MG PO TABS
50.0000 mg | ORAL_TABLET | Freq: Two times a day (BID) | ORAL | Status: DC
  Administered 2015-01-17 – 2015-01-18 (×3): 50 mg via ORAL
  Filled 2015-01-17 (×3): qty 1

## 2015-01-17 MED ORDER — DIAZEPAM 5 MG PO TABS
5.0000 mg | ORAL_TABLET | Freq: Three times a day (TID) | ORAL | Status: DC | PRN
  Administered 2015-01-17 (×2): 5 mg via ORAL
  Filled 2015-01-17 (×2): qty 1

## 2015-01-17 MED ORDER — SODIUM CHLORIDE 0.9 % IJ SOLN: 3.0000 mL | Freq: Two times a day (BID) | INTRAMUSCULAR | Status: DC

## 2015-01-17 MED ORDER — ALLOPURINOL 300 MG PO TABS
300.0000 mg | ORAL_TABLET | Freq: Every day | ORAL | Status: DC
  Administered 2015-01-17 – 2015-01-18 (×2): 300 mg via ORAL
  Filled 2015-01-17 (×2): qty 1

## 2015-01-17 MED ORDER — CHLORHEXIDINE GLUCONATE 0.12 % MT SOLN
15.0000 mL | Freq: Two times a day (BID) | OROMUCOSAL | Status: DC
  Administered 2015-01-17: 15 mL via OROMUCOSAL
  Filled 2015-01-17 (×3): qty 15

## 2015-01-17 MED ORDER — ROSUVASTATIN CALCIUM 5 MG PO TABS
5.0000 mg | ORAL_TABLET | Freq: Every day | ORAL | Status: DC
  Administered 2015-01-17: 5 mg via ORAL
  Filled 2015-01-17 (×2): qty 1

## 2015-01-17 MED ORDER — PANTOPRAZOLE SODIUM 40 MG PO TBEC
40.0000 mg | DELAYED_RELEASE_TABLET | Freq: Every day | ORAL | Status: DC
  Administered 2015-01-17 – 2015-01-18 (×2): 40 mg via ORAL
  Filled 2015-01-17 (×2): qty 1

## 2015-01-17 NOTE — ED Provider Notes (Addendum)
CSN: [id number]     Arrival date & time 01/17/15  0015 History   First MD Initiated Contact with Patient 01/17/15 0226     Chief Complaint  Patient presents with  . Fall  . Back Pain     (Consider location/radiation/quality/duration/timing/severity/associated sxs/prior Treatment) HPI Comments: Pt comes in with cc of fall and back pain. Per daughter, there is also altered mental status. Pt has had 3 falls over the last 2 days. Pt is on coumadin due to afib. Pt reports that she has balance problems that make her fall. She has no hx of recurrent falls. Pt has back pain from the fall. Daughter also reports altered mental status. Pt called her yday and asked her to take dogs out - there are no dogs in their house. She also has had episodes of being disoriented, and one occasion where she couldn't remember her daughter. Pt was started on tramadol - last dose was 4 am on Saturday. She had been taking tramadol prn since 5th July. Also Pt is a smoker and is noted to be be hypoxic. She has hx of copd. She went to [facility] ER. Neg CT head. Pt refused MRI, was ambulating and discharged. Daughter reports that pt was on the floor again this evening.  Additionally, pt was noted to be saturating in the upper 80% when i arrived. She was placed on nasal canula and is saturating in the low 90%. Pt denies any new cough, has slight wheezing.  ROS 10 Systems reviewed and are negative for acute change except as noted in the HPI.     Patient is a 81 y.o. female presenting with fall and back pain. The history is provided by the patient and a relative.  Fall  Back Pain   Past Medical History  Diagnosis Date  . Hyperlipidemia   . Gout   . History of small bowel obstruction   . COPD (chronic obstructive pulmonary disease)   . Substance abuse     Tobacco  . Depression   . Pulmonary hypertension   . Umbilical hernia   . Bronchitis   . Atrial fibrillation   . High cholesterol   . Pneumonia   . Arthritis   .  Abdominal aortic aneurysm     followed by Dr Kellie Simmering- 4.8 cm per [place] 6/12- [facility]  . Complication of anesthesia     slow to wake up per pt- anesthesia record on chart  . Hypertension   . Anxiety   . Atherosclerosis of aorta   . Systolic heart failure     EF 40-45%    Past Surgical History  Procedure Laterality Date  . Small intestine surgery  05/2010  . Abdominal hysterectomy      Vaginal  . Hernia repair  05/2010    incarcerated Umbilical hernia  . Tonsillectomy    . Incisional hernia repair  02/21/2012    Procedure: LAPAROSCOPIC INCISIONAL HERNIA;  Surgeon: Harl Bowie, MD;  Location: [facility] ORS;  Service: General;  Laterality: N/A;   Family History  Problem Relation Age of Onset  . Cancer Mother   . Heart disease Father   . Cancer Daughter   . Deep vein thrombosis Daughter    History  Substance Use Topics  . Smoking status: Current Every Day Smoker -- 0.50 packs/day for 50 years    Types: Cigarettes  . Smokeless tobacco: Never Used  . Alcohol Use: No   OB History    No data available  Review of Systems  Musculoskeletal: Positive for back pain.  All other systems reviewed and are negative.     Allergies  Hctz; Sulfa antibiotics; and Tetracyclines & related  Home Medications   Prior to Admission medications   Medication Sig Start Date End Date Taking? Authorizing Provider  acetaminophen (TYLENOL) 500 MG tablet Take 500 mg by mouth every 6 (six) hours as needed for moderate pain.   Yes Historical Provider, MD  allopurinol (ZYLOPRIM) 300 MG tablet TAKE 1 TABLET ONCE DAILY. FOR GOUT 01/12/15  Yes Vicie Mutters, PA-C  aspirin EC 81 MG EC tablet Take 1 tablet (81 mg total) by mouth daily. 09/18/14  Yes Barton Dubois, MD  citalopram (CELEXA) 20 MG tablet Take 1 tablet (20 mg total) by mouth daily. 05/01/14 05/01/15 Yes Unk Pinto, MD  diltiazem (CARDIZEM CD) 180 MG 24 hr capsule Take 1 capsule (180 mg total) by mouth daily. 11/18/14  Yes Unk Pinto, MD   fenofibrate (TRICOR) 145 MG tablet Take 145 mg by mouth daily. 12/27/14  Yes Historical Provider, MD  furosemide (LASIX) 20 MG tablet TAKE 1 TABLET 3 TIMES A DAY. Patient taking differently: TAKE 1 TABLET DAILY. 12/20/14  Yes Unk Pinto, MD  Magnesium 250 MG TABS Take 250 mg by mouth daily.   Yes Historical Provider, MD  metoprolol succinate (TOPROL-XL) 100 MG 24 hr tablet TAKE 1 TABLET ONCE DAILY WITH A MEAL. 01/12/15  Yes Vicie Mutters, PA-C  montelukast (SINGULAIR) 10 MG tablet TAKE ONE TABLET AT BEDTIME. 12/27/14  Yes Vicie Mutters, PA-C  pantoprazole (PROTONIX) 40 MG tablet Take 1 tablet (40 mg total) by mouth daily. 11/26/14 11/26/15 Yes Vicie Mutters, PA-C  potassium chloride (K-DUR) 10 MEQ tablet Take 1 tablet (10 mEq total) by mouth 2 (two) times daily. 04/09/14  Yes Unk Pinto, MD  rosuvastatin (CRESTOR) 20 MG tablet Take 1 tablet (20 mg total) by mouth daily. Patient taking differently: Take 5 mg by mouth daily.  11/26/14  Yes Vicie Mutters, PA-C  traMADol (ULTRAM) 50 MG tablet Take 1 tablet (50 mg total) by mouth every 6 (six) hours as needed. 01/12/15  Yes Charlann Lange, PA-C  warfarin (COUMADIN) 2.5 MG tablet TAKE 1 TABLET DAILY EXCEPT WEDNESDAY Patient taking differently: TAKE 1 TABLET (2.5 MG) ON DAILY MONDAY,  WEDNESDAY, AND FRIDAY & 1.5 TABLET (3.75 MG) TUESDAY, THURSDAY, SATURDAY AND SUNDAY 01/12/15  Yes Vicie Mutters, PA-C   BP 149/115 mmHg  Pulse 60  Temp(Src) 98.1 F (36.7 C) (Oral)  Resp 16  SpO2 96% Physical Exam  Constitutional: She is oriented to person, place, and time. She appears well-developed and well-nourished.  HENT:  Head: Normocephalic and atraumatic.  Eyes: EOM are normal. Pupils are equal, round, and reactive to light.  Neck: Neck supple.  Cardiovascular: Normal rate, regular rhythm and normal heart sounds.   No murmur heard. Pulmonary/Chest: Effort normal. No respiratory distress.  Abdominal: Soft. She exhibits no distension. There is no  tenderness. There is no rebound and no guarding.  Neurological: She is alert and oriented to person, place, and time. No cranial nerve deficit. Coordination normal.  Skin: Skin is warm and dry.  Nursing note and vitals reviewed.   ED Course  Procedures (including critical care time) Labs Review Labs Reviewed  BASIC METABOLIC PANEL - Abnormal; Notable for the following:    Glucose, Bld 100 (*)    BUN 28 (*)    Creatinine, Ser 1.58 (*)    Calcium 8.6 (*)    GFR calc non Af  Amer 29 (*)    GFR calc Af Amer 34 (*)    All other components within normal limits  PROTIME-INR - Abnormal; Notable for the following:    Prothrombin Time 27.3 (*)    INR 2.58 (*)    All other components within normal limits  CBC WITH DIFFERENTIAL/PLATELET  TROPONIN I    Imaging Review Dg Chest 2 View  01/16/2015   CLINICAL DATA:  Patient with left facial droop status post nursing home visit. Confusion. Multiple falls.  EXAM: CHEST  2 VIEW  COMPARISON:  Chest radiograph 09/16/2014  FINDINGS: Patient is rotated to the left. Stable enlarged cardiac and mediastinal contours. Markedly tortuous aorta. No consolidative pulmonary opacities. Minimal heterogeneous opacities left lung base. No pleural effusion or pneumothorax. Suggestion of possible linear lucency distal left clavicle.  IMPRESSION: Possible linear lucency distal left clavicle. Consider correlation with dedicated shoulder radiographs if patient is focally tender in this location to exclude nondisplaced fracture.  Cardiomegaly.  Left basilar atelectasis.   Electronically Signed   By: Lovey Newcomer M.D.   On: 01/16/2015 10:19   Dg Lumbar Spine Complete  01/17/2015   CLINICAL DATA:  Unwitnessed fall with mid to low back pain. Initial encounter.  EXAM: LUMBAR SPINE - COMPLETE 4+ VIEW  COMPARISON:  05/15/2014  FINDINGS: L3 compression fracture with approximately 25 % height loss, mainly involving the superior endplate. There is also new concavity of the L2 superior  endplate consistent with fracture. No discrete fracture line is seen. No evidence of retropulsion. There is a moderate compression deformity of the T12 vertebral body, also present in 2015 (and progressed from that time).  Narrowed and calcified L5-S1 disc space. There is lower lumbar facet arthropathy with grade 1 anterolisthesis at L4-5.  IMPRESSION: 1. L2 and L3 superior endplate fractures with mild compression. These are new from November 2015 and presumably acute. 2. Remote moderate T12 compression fracture.   Electronically Signed   By: Monte Fantasia M.D.   On: 01/17/2015 02:42   Dg Clavicle Left  01/16/2015   CLINICAL DATA:  Fall, abnormal chest radiograph  EXAM: LEFT CLAVICLE - 2+ VIEWS  COMPARISON:  None.  FINDINGS: No displaced clavicle fracture is seen.  Degenerative changes at the acromioclavicular joint.  Cutaneous lead overlying the distal 3rd of the clavicle.  IMPRESSION: No fracture or dislocation is seen.   Electronically Signed   By: Julian Hy M.D.   On: 01/16/2015 12:14   Ct Head Wo Contrast  01/17/2015   CLINICAL DATA:  Fall with confusion.  Initial encounter.  EXAM: CT HEAD WITHOUT CONTRAST  TECHNIQUE: Contiguous axial images were obtained from the base of the skull through the vertex without intravenous contrast.  COMPARISON:  Head CT from yesterday  FINDINGS: Skull and Sinuses:Negative for fracture or destructive process.  There is a 1 cm nodule right preauricular which is stable from 05/15/2014.  Orbits: No acute abnormality.  Brain: No evidence of acute infarction, hemorrhage, hydrocephalus, or mass lesion/mass effect. Generalized brain atrophy which is advanced. There is related ventriculomegaly. Moderate chronic small vessel disease with confluent ischemic gliosis throughout the bilateral cerebral white matter.  IMPRESSION: 1. No evidence of intracranial injury or fracture. 2. Brain atrophy and chronic small vessel disease. 3. 1 cm preauricular adenopathy or parotid nodule  on the right, stable from November 2015.   Electronically Signed   By: Monte Fantasia M.D.   On: 01/17/2015 04:26   Ct Head Wo Contrast  01/16/2015   CLINICAL DATA:  Left facial droop, confusion, fall  EXAM: CT HEAD WITHOUT CONTRAST  TECHNIQUE: Contiguous axial images were obtained from the base of the skull through the vertex without intravenous contrast.  COMPARISON:  09/15/2014  FINDINGS: No evidence of parenchymal hemorrhage or extra-axial fluid collection. No mass lesion, mass effect, or midline shift.  No CT evidence of acute infarction.  Subcortical white matter and periventricular small vessel ischemic changes.  Global cortical and central atrophy. Secondary ventricular prominence.  The visualized paranasal sinuses are essentially clear. The mastoid air cells are unopacified.  No evidence of calvarial fracture.  IMPRESSION: No evidence of acute intracranial abnormality.  Atrophy with small vessel ischemic changes.   Electronically Signed   By: Julian Hy M.D.   On: 01/16/2015 11:35   Dg Hip Unilat With Pelvis 2-3 Views Right  01/17/2015   CLINICAL DATA:  Unwitnessed fall at home with right hip pain. Initial encounter.  EXAM: DG HIP (WITH OR WITHOUT PELVIS) 2-3V RIGHT  COMPARISON:  None.  FINDINGS: No evidence of hip fracture or dislocation. No evidence of pelvic ring fracture or diastasis.  Osteopenia.  Mild degenerative spurring about both hips.  IMPRESSION: No acute osseous findings.   Electronically Signed   By: Monte Fantasia M.D.   On: 01/17/2015 03:44     EKG Interpretation   Date/Time:  Sunday January 17 2015 04:01:55 EDT Ventricular Rate:  57 PR Interval:  213 QRS Duration: 104 QT Interval:  479 QTC Calculation: 466 R Axis:   -37 Text Interpretation:  Sinus rhythm Atrial premature complex Borderline  prolonged PR interval Left axis deviation No acute changes Confirmed by  Kathrynn Humble, MD, Jesusa Stenerson [id number]) on 01/17/2015 4:41:59 AM      MDM   Final diagnoses:  Fall  Fracture  of lumbar spine, closed, initial encounter  Confusion  Ataxic gait  Acute on chronic respiratory failure with hypoxia    Pt comes in post fall.  DDx includes: - Mechanical falls - ICH - Fractures - Contusions - Soft tissue injury  She has an acute lumbar spine fracture. Just seen earlier today, and there are concerns for stroke - but pt refused MRI, she ambulated well, and Neuro team reviewed her chart and decided that no further therapeutic or diagnostic modalities needed - so she was discharged. Pt fell at home again.  She has some gait problems, lives by herself, falls are new. There is no dysmetria - but the unsteady gait might be from the stroke, and since pt is on coumadin and has had few falls as of late, she doesn't feel comfortable taking home.   Confusion - dementia vs. Stroke vs. Medications. She is ao x 3 for me.  Pt also had hypoxia. She is saturating well on [place]. She has no new pulm complains, so suspect that she likely has worsening of her copd. She had a normal xray today.    Varney Biles, MD 01/17/15 [place], MD 01/17/15 [phone]

## 2015-01-17 NOTE — ED Notes (Signed)
Pt's daughter came to nurse's station, complaining that they have been "waiting for an hour and nobody has come to see her yet".  Reason of delay explained to family.

## 2015-01-17 NOTE — Progress Notes (Signed)
Pt admitted after midnight Please refer to admission note for details. Pt elderly, admitted for fall  Assessment and Plan: Fall / generalized weakness - Needs PT eval - Continue pain management efforts.  Leisa Lenz [facility] [phone]

## 2015-01-17 NOTE — ED Notes (Signed)
Bed: WA09 Expected date:  Expected time:  Means of arrival:  Comments: EMS 66F fall, low back pain, AMS (seen by md)

## 2015-01-17 NOTE — ED Notes (Signed)
Verbal order given by dr. Kathrynn Humble to place patient on supplemental oxygen.  2L  Patient sats 88 on room air.  Patient now saturating 94 on 2L.

## 2015-01-17 NOTE — Evaluation (Addendum)
Physical Therapy Evaluation Patient Details Name: Heather Stevenson MRN: [id number] DOB: [birth year] Today's Date: 01/17/2015   History of Present Illness  81 y.o. female with h/o a fib, CHF, COPD, stage IV CKD admitted with 3 falls in 2 days, confusion. CT of head negative for acute changes. Dx of acute L2, L3 compression fractures.   Clinical Impression  Pt admitted with above diagnosis. Pt currently with functional limitations due to the deficits listed below (see PT Problem List). Mod assist for OOB, min A to walk 14' with RW, distance limited by "11/10 pain all over". With h/o multiple falls and pt requiring assist for mobility, ST-SNF recommended.  Pt will benefit from skilled PT to increase their independence and safety with mobility to allow discharge to the venue listed below.       Follow Up Recommendations SNF    Equipment Recommendations  Wheelchair (measurements PT)    Recommendations for Other Services       Precautions / Restrictions Precautions Precautions: Fall Precaution Comments: multiple falls Required Braces or Orthoses: Spinal Brace Spinal Brace: Thoracolumbosacral orthotic (not yet delivered to room) Restrictions Weight Bearing Restrictions: No      Mobility  Bed Mobility Overal bed mobility: Needs Assistance Bed Mobility: Sidelying to Sit   Sidelying to sit: Mod assist       General bed mobility comments: pt was lying on L side at start of tx, Mod assist and verbal cues for technique for sidelying to sit  Transfers Overall transfer level: Needs assistance Equipment used: Rolling walker (2 wheeled) Transfers: Sit to/from Stand Sit to Stand: Min assist         General transfer comment: min A to rise from bed, cues for hand placement (pt attempted to push up on RW with BUEs)  Ambulation/Gait Ambulation/Gait assistance: Min guard Ambulation Distance (Feet): 14 Feet Assistive device: Rolling walker (2 wheeled) Gait Pattern/deviations:  Step-through pattern;Decreased stride length     General Gait Details: distance limited by "11/10" pain all over, min/guard for safety due to recent falls  Stairs            Wheelchair Mobility    Modified Rankin (Stroke Patients Only)       Balance Overall balance assessment: Needs assistance   Sitting balance-Leahy Scale: Good       Standing balance-Leahy Scale: Poor                               Pertinent Vitals/Pain Pain Assessment: 0-10 Pain Score: 10-Worst pain ever Pain Location: "all over"  "back is the worst" Pain Descriptors / Indicators: Sore Pain Intervention(s): Patient requesting pain meds-RN notified    Home Living Family/patient expects to be discharged to:: Unsure Living Arrangements: Alone Available Help at Discharge: Family;Available PRN/intermittently (family works) Type of Home: Independent living facility Home Access: [place]: One [place]: Environmental consultant - 4 wheels;Shower seat - built in Additional Comments: pt lives in senior living complex    Prior Function Level of Independence: Needs assistance   Gait / Transfers Assistance Needed: uses rollator  ADL's / Homemaking Assistance Needed: daughter assists with bathing  Comments: rollator; reports multiple falls at home.     Hand Dominance   Dominant Hand: Right    Extremity/Trunk Assessment   Upper Extremity Assessment: Overall WFL for tasks assessed           Lower Extremity Assessment: Generalized weakness (B  knee extension -10* AROM, knee ext strength 4/5)      Cervical / Trunk Assessment: Kyphotic  Communication   Communication: No difficulties  Cognition Arousal/Alertness: Awake/alert Behavior During Therapy: WFL for tasks assessed/performed Overall Cognitive Status: No family/caregiver present to determine baseline cognitive functioning (oriented to place and situation, difficulty with problem solving (deciding what to have for  lunch, discussing DC plan))                      General Comments      Exercises        Assessment/Plan    PT Assessment Patient needs continued PT services  PT Diagnosis Difficulty walking;Acute pain   PT Problem List Decreased strength;Decreased activity tolerance;Decreased balance;Decreased knowledge of use of DME;Decreased mobility;Decreased safety awareness;Pain  PT Treatment Interventions DME instruction;Gait training;Functional mobility training;Therapeutic activities;Balance training;Therapeutic exercise   PT Goals (Current goals can be found in the Care Plan section) Acute Rehab PT Goals Patient Stated Goal: to stop falling PT Goal Formulation: With patient Time For Goal Achievement: 01/31/15 Potential to Achieve Goals: Fair    Frequency Min 3X/week   Barriers to discharge Decreased caregiver support lives alone, family works during day    Co-evaluation               End of Glass blower/designer During Treatment: Gait belt Activity Tolerance: Patient limited by pain Patient left: in Art gallery manager Communication: Mobility status    Functional Assessment Tool Used: clinical judgment Functional Limitation: Mobility: Walking and moving around Mobility: Walking and Moving Around Current Status [phone]): At least 20 percent but less than 40 percent impaired, limited or restricted Mobility: Walking and Moving Around Goal Status [phone]): At least 1 percent but less than 20 percent impaired, limited or restricted    Time: 1349-1410 PT Time Calculation (min) (ACUTE ONLY): 21 min   Charges:   PT Evaluation $Initial PT Evaluation Tier I: 1 Procedure     PT G Codes:   PT G-Codes **NOT FOR INPATIENT CLASS** Functional Assessment Tool Used: clinical judgment Functional Limitation: Mobility: Walking and moving around Mobility: Walking and Moving Around Current Status (B6389): At least 20 percent but less than 40 percent impaired,  limited or restricted Mobility: Walking and Moving Around Goal Status [phone]): At least 1 percent but less than 20 percent impaired, limited or restricted    Philomena Doheny 01/17/2015, 2:34 PM [phone]

## 2015-01-17 NOTE — Progress Notes (Addendum)
PT Cancellation Note  Patient Details Name: Heather Stevenson MRN: [id number] DOB: 1930-01-12  Late entry for 8:43 am Cancelled Treatment:    Reason Eval/Treat Not Completed: Fatigue/lethargy limiting ability to participate (pt sleeping soundly and did not arouse to verbal/tactile stimulii. Will follow. )   Philomena Doheny 01/17/2015, 12:26 PM [phone]

## 2015-01-17 NOTE — Progress Notes (Signed)
ANTICOAGULATION CONSULT NOTE - Follow Up Consult  Pharmacy Consult for Warfarin Indication: Atrial Fibrillation  Allergies  Allergen Reactions  . Hctz [Hydrochlorothiazide]     unknown  . Sulfa Antibiotics     unknown  . Tetracyclines & Related     unknown    Patient Measurements: Height: _0  (167.6 cm) Weight: 180 lb 8.9 oz (81.9 kg) IBW/kg (Calculated) : 59.3 Heparin Dosing Weight:   Vital Signs: Temp: 98.5 F (36.9 C) (07/10 0606) Temp Source: Oral (07/10 0606) BP: 167/79 mmHg (07/10 0906) Pulse Rate: 57 (07/10 0906)  Labs:  Recent Labs  01/16/15 0942 01/16/15 1033 01/17/15 0345  HGB 13.0 13.9 12.7  HCT 38.5 41.0 37.8  PLT 218  --  241  APTT 52*  --   --   LABPROT 27.8*  --  27.3*  INR 2.64*  --  2.58*  CREATININE 1.72* 1.60* 1.58*  TROPONINI  --   --  <0.03    Estimated Creatinine Clearance: 29.1 mL/min (by C-G formula based on Cr of 1.58).   Medications:  PTA warfarin 3.18m daily except 2.584mon M/W/F, LD 7/10.    Assessment: [age] admitted after third fall in 2 days, now with lumbar compression fracture.  Pt on chronic warfarin for hx atrial fibrillation.  Poor historian and conflicting reports on last dose warfarin at home.  INR tx on admission.  CT scan negative on 7/9 and 7/10 both negative. Warfarin resumed inpatient on 7/9.   7/10: INR today remains therapeutic. No issues noted with CBC.  Cardiac diet started (0% charted 7/10).    Goal of Therapy:  INR 2-3 Monitor platelets by anticoagulation protocol: Yes   Plan:  Warfarin 3.7580mlready ordered for tonight.  Daily PT/INR.   F/u long-term anticoag plans with repeated falls.    ColRalene BatheharmD, BCPS 01/17/2015, 1:34 PM  Pager: [phone]

## 2015-01-17 NOTE — H&P (Signed)
[facility] History and Physical  Patient: Heather Stevenson  MRN: [id number]  DOB: 10-Sep-1929  DOS: the patient was seen and examined on 01/17/2015 PCP: Alesia Richards, MD  Referring physician: Dr. Kathrynn Humble Chief Complaint: Fall  HPI: Heather Stevenson is a 81 y.o. female with Past medical history of atrial fibrillation on Coumadin, COPD, dyslipidemia, chronic combined CHF, essential hypertension, high risk for fall. The patient is presenting with fall. The patient had 3 falls in last 2 days. The follow-up were not witnessed. The follow has been described as the patient's legs will give away. Patient denies any dizziness or lightheadedness. Although the patient is significantly poor historian as he has been significantly restless and history was only obtained from patient's daughter. As per daughter the patient has moderate also more confused than her baseline. Patient recently on 01/12/2015 was seen in ER after of back sprain without any fall. She was placed on tramadol and she has been taking it regularly and then she started developing falls. No nausea no vomiting no diarrhea no constipation no burning urination. No fever no chills no cough. No chest pain and abdominal pain. Other than tramadol no other recent change in medication. Other than these falls that started after tramadol she had prior fall back in November.  The patient is coming from home.  At her baseline ambulates with support And is independent for most of her ADL does not manages her medication on her own.  Review of Systems: as mentioned in the history of present illness.  A comprehensive review of the other systems is negative.  Past Medical History  Diagnosis Date  . Hyperlipidemia   . Gout   . History of small bowel obstruction   . COPD (chronic obstructive pulmonary disease)   . Substance abuse     Tobacco  . Depression   . Pulmonary hypertension   . Umbilical hernia   . Bronchitis   .  Atrial fibrillation   . High cholesterol   . Pneumonia   . Arthritis   . Abdominal aortic aneurysm     followed by Dr Kellie Simmering- 4.8 cm per [place] 6/12- [facility]  . Complication of anesthesia     slow to wake up per pt- anesthesia record on chart  . Hypertension   . Anxiety   . Atherosclerosis of aorta   . Systolic heart failure     EF 40-45%    Past Surgical History  Procedure Laterality Date  . Small intestine surgery  05/2010  . Abdominal hysterectomy      Vaginal  . Hernia repair  05/2010    incarcerated Umbilical hernia  . Tonsillectomy    . Incisional hernia repair  02/21/2012    Procedure: LAPAROSCOPIC INCISIONAL HERNIA;  Surgeon: Harl Bowie, MD;  Location: [facility];  Service: General;  Laterality: N/A;   Social History:  reports that she has been smoking Cigarettes.  She has a 25 pack-year smoking history. She has never used smokeless tobacco. She reports that she does not drink alcohol or use illicit drugs.  Allergies  Allergen Reactions  . Hctz [Hydrochlorothiazide]     unknown  . Sulfa Antibiotics     unknown  . Tetracyclines & Related     unknown    Family History  Problem Relation Age of Onset  . Cancer Mother   . Heart disease Father   . Cancer Daughter   . Deep vein thrombosis Daughter     Prior to Admission medications  Medication Sig Start Date End Date Taking? Authorizing Provider  acetaminophen (TYLENOL) 500 MG tablet Take 500 mg by mouth every 6 (six) hours as needed for moderate pain.   Yes Historical Provider, MD  allopurinol (ZYLOPRIM) 300 MG tablet TAKE 1 TABLET ONCE DAILY. FOR GOUT 01/12/15  Yes Vicie Mutters, PA-C  aspirin EC 81 MG EC tablet Take 1 tablet (81 mg total) by mouth daily. 09/18/14  Yes Barton Dubois, MD  citalopram (CELEXA) 20 MG tablet Take 1 tablet (20 mg total) by mouth daily. 05/01/14 05/01/15 Yes Unk Pinto, MD  diltiazem (CARDIZEM CD) 180 MG 24 hr capsule Take 1 capsule (180 mg total) by mouth daily. 11/18/14  Yes Unk Pinto, MD  fenofibrate (TRICOR) 145 MG tablet Take 145 mg by mouth daily. 12/27/14  Yes Historical Provider, MD  furosemide (LASIX) 20 MG tablet TAKE 1 TABLET 3 TIMES A DAY. Patient taking differently: TAKE 1 TABLET DAILY. 12/20/14  Yes Unk Pinto, MD  Magnesium 250 MG TABS Take 250 mg by mouth daily.   Yes Historical Provider, MD  metoprolol succinate (TOPROL-XL) 100 MG 24 hr tablet TAKE 1 TABLET ONCE DAILY WITH A MEAL. 01/12/15  Yes Vicie Mutters, PA-C  montelukast (SINGULAIR) 10 MG tablet TAKE ONE TABLET AT BEDTIME. 12/27/14  Yes Vicie Mutters, PA-C  pantoprazole (PROTONIX) 40 MG tablet Take 1 tablet (40 mg total) by mouth daily. 11/26/14 11/26/15 Yes Vicie Mutters, PA-C  potassium chloride (K-DUR) 10 MEQ tablet Take 1 tablet (10 mEq total) by mouth 2 (two) times daily. 04/09/14  Yes Unk Pinto, MD  rosuvastatin (CRESTOR) 20 MG tablet Take 1 tablet (20 mg total) by mouth daily. Patient taking differently: Take 5 mg by mouth daily.  11/26/14  Yes Vicie Mutters, PA-C  traMADol (ULTRAM) 50 MG tablet Take 1 tablet (50 mg total) by mouth every 6 (six) hours as needed. 01/12/15  Yes Charlann Lange, PA-C  warfarin (COUMADIN) 2.5 MG tablet TAKE 1 TABLET DAILY EXCEPT WEDNESDAY Patient taking differently: TAKE 1 TABLET (2.5 MG) ON DAILY MONDAY,  WEDNESDAY, AND FRIDAY & 1.5 TABLET (3.75 MG) TUESDAY, THURSDAY, SATURDAY AND SUNDAY 01/12/15  Yes Vicie Mutters, PA-C    Physical Exam: Filed Vitals:   01/17/15 0440 01/17/15 0500 01/17/15 0555 01/17/15 0606  BP: 149/115 156/83  152/64  Pulse: 60 44  56  Temp: 98.1 F (36.7 C)   98.5 F (36.9 C)  TempSrc: Oral   Oral  Resp: _0 Height:   _1  (1.676 m)   Weight:   81.9 kg (180 lb 8.9 oz)   SpO2: 96%       General: Alert, Awake and Oriented to Time, Place and Person. Appear in marked distress Eyes: PERRL ENT: Oral Mucosa clear dry. Neck: no JVD Cardiovascular: S1 and S2 Present, no Murmur, Peripheral Pulses Present Respiratory:  Bilateral Air entry equal and Decreased,  Clear to Auscultation, no Crackles, no wheezes Abdomen: Bowel Sound present, Soft and non tender Skin: no Rash Extremities: no Pedal edema, no calf tenderness Neurologic: Grossly no focal neuro deficit, limited examined the patient is not cooperating  Labs on Admission:  CBC:  Recent Labs Lab 01/16/15 0942 01/16/15 1033 01/17/15 0345  WBC 7.2  --  8.9  NEUTROABS 4.5  --  5.7  HGB 13.0 13.9 12.7  HCT 38.5 41.0 37.8  MCV 95.8  --  96.7  PLT 218  --  241    CMP     Component Value Date/Time   NA  137 01/17/2015 0345   K 4.2 01/17/2015 0345   CL 104 01/17/2015 0345   CO2 25 01/17/2015 0345   GLUCOSE 100* 01/17/2015 0345   BUN 28* 01/17/2015 0345   CREATININE 1.58* 01/17/2015 0345   CREATININE 1.47* 01/06/2015 1707   CALCIUM 8.6* 01/17/2015 0345   PROT 6.4* 01/16/2015 0942   ALBUMIN 3.1* 01/16/2015 0942   AST 20 01/16/2015 0942   ALT 11* 01/16/2015 0942   ALKPHOS 68 01/16/2015 0942   BILITOT 0.6 01/16/2015 0942   GFRNONAA 29* 01/17/2015 0345   GFRNONAA 33* 01/06/2015 1707   GFRAA 34* 01/17/2015 0345   GFRAA 38* 01/06/2015 1707    No results for input(s): LIPASE, AMYLASE in the last 168 hours.   Recent Labs Lab 01/17/15 0345  TROPONINI <0.03   BNP (last 3 results) No results for input(s): BNP in the last 8760 hours.  ProBNP (last 3 results) No results for input(s): PROBNP in the last 8760 hours.   Radiological Exams on Admission: Dg Chest 2 View  01/16/2015   CLINICAL DATA:  Patient with left facial droop status post nursing home visit. Confusion. Multiple falls.  EXAM: CHEST  2 VIEW  COMPARISON:  Chest radiograph 09/16/2014  FINDINGS: Patient is rotated to the left. Stable enlarged cardiac and mediastinal contours. Markedly tortuous aorta. No consolidative pulmonary opacities. Minimal heterogeneous opacities left lung base. No pleural effusion or pneumothorax. Suggestion of possible linear lucency distal left clavicle.   IMPRESSION: Possible linear lucency distal left clavicle. Consider correlation with dedicated shoulder radiographs if patient is focally tender in this location to exclude nondisplaced fracture.  Cardiomegaly.  Left basilar atelectasis.   Electronically Signed   By: Lovey Newcomer M.D.   On: 01/16/2015 10:19   Dg Lumbar Spine Complete  01/17/2015   CLINICAL DATA:  Unwitnessed fall with mid to low back pain. Initial encounter.  EXAM: LUMBAR SPINE - COMPLETE 4+ VIEW  COMPARISON:  05/15/2014  FINDINGS: L3 compression fracture with approximately 25 % height loss, mainly involving the superior endplate. There is also new concavity of the L2 superior endplate consistent with fracture. No discrete fracture line is seen. No evidence of retropulsion. There is a moderate compression deformity of the T12 vertebral body, also present in 2015 (and progressed from that time).  Narrowed and calcified L5-S1 disc space. There is lower lumbar facet arthropathy with grade 1 anterolisthesis at L4-5.  IMPRESSION: 1. L2 and L3 superior endplate fractures with mild compression. These are new from November 2015 and presumably acute. 2. Remote moderate T12 compression fracture.   Electronically Signed   By: Monte Fantasia M.D.   On: 01/17/2015 02:42   Dg Clavicle Left  01/16/2015   CLINICAL DATA:  Fall, abnormal chest radiograph  EXAM: LEFT CLAVICLE - 2+ VIEWS  COMPARISON:  None.  FINDINGS: No displaced clavicle fracture is seen.  Degenerative changes at the acromioclavicular joint.  Cutaneous lead overlying the distal 3rd of the clavicle.  IMPRESSION: No fracture or dislocation is seen.   Electronically Signed   By: Julian Hy M.D.   On: 01/16/2015 12:14   Ct Head Wo Contrast  01/17/2015   CLINICAL DATA:  Fall with confusion.  Initial encounter.  EXAM: CT HEAD WITHOUT CONTRAST  TECHNIQUE: Contiguous axial images were obtained from the base of the skull through the vertex without intravenous contrast.  COMPARISON:  Head CT  from yesterday  FINDINGS: Skull and Sinuses:Negative for fracture or destructive process.  There is a 1 cm nodule right preauricular  which is stable from 05/15/2014.  Orbits: No acute abnormality.  Brain: No evidence of acute infarction, hemorrhage, hydrocephalus, or mass lesion/mass effect. Generalized brain atrophy which is advanced. There is related ventriculomegaly. Moderate chronic small vessel disease with confluent ischemic gliosis throughout the bilateral cerebral white matter.  IMPRESSION: 1. No evidence of intracranial injury or fracture. 2. Brain atrophy and chronic small vessel disease. 3. 1 cm preauricular adenopathy or parotid nodule on the right, stable from November 2015.   Electronically Signed   By: Monte Fantasia M.D.   On: 01/17/2015 04:26   Ct Head Wo Contrast  01/16/2015   CLINICAL DATA:  Left facial droop, confusion, fall  EXAM: CT HEAD WITHOUT CONTRAST  TECHNIQUE: Contiguous axial images were obtained from the base of the skull through the vertex without intravenous contrast.  COMPARISON:  09/15/2014  FINDINGS: No evidence of parenchymal hemorrhage or extra-axial fluid collection. No mass lesion, mass effect, or midline shift.  No CT evidence of acute infarction.  Subcortical white matter and periventricular small vessel ischemic changes.  Global cortical and central atrophy. Secondary ventricular prominence.  The visualized paranasal sinuses are essentially clear. The mastoid air cells are unopacified.  No evidence of calvarial fracture.  IMPRESSION: No evidence of acute intracranial abnormality.  Atrophy with small vessel ischemic changes.   Electronically Signed   By: Julian Hy M.D.   On: 01/16/2015 11:35   Dg Hip Unilat With Pelvis 2-3 Views Right  01/17/2015   CLINICAL DATA:  Unwitnessed fall at home with right hip pain. Initial encounter.  EXAM: DG HIP (WITH OR WITHOUT PELVIS) 2-3V RIGHT  COMPARISON:  None.  FINDINGS: No evidence of hip fracture or dislocation. No evidence  of pelvic ring fracture or diastasis.  Osteopenia.  Mild degenerative spurring about both hips.  IMPRESSION: No acute osseous findings.   Electronically Signed   By: Monte Fantasia M.D.   On: 01/17/2015 03:44   EKG: Independently reviewed. sinus bradycardia.  Assessment/Plan Principal Problem:   Lumbar compression fracture Active Problems:   Hypertension   COPD (chronic obstructive pulmonary disease)   Chronic systolic CHF (congestive heart failure)   Atrial fibrillation, chronic   GERD (gastroesophageal reflux disease)   Chronic kidney disease   At high risk for falls   1. Lumbar compression fracture The patient is presenting with complaints of recurrent fall. She was seen earlier on 01/16/2015 at 11 AM and a CT scan head was negative. Neurology consultation recommended no further workup required if she is on maximum therapy. Patient was discharged back home. Repeat CT scan on 01/17/2015 does not reveal any acute abnormality again. The possibility of stroke causing her recurrent fall is less likely. This is more likely medication side effects. Unfortunately she has developed lumbar compression fracture which are also significantly painful and therefore the patient is significantly depressed less. I will use when necessary Valium as well as Tylenol for pain control. Unfortunately cannot use any NSAIDs due to chronic kidney disease. TLSO brace and PTOT consultation.  2. COPD. Appears stable. Continue close monitoring.  3. Atrial fibrillation with chronic combined CHF. Does not appear to be having any flareup of CHF. She has sinus bradycardia on telemetry and EKG Therefore I will change her Cardizem from 24-hour sustained release tablet every -8 hours immediate release tablet. I would also change her Toprol to Lopressor 50 twice a day. This can also be the cause for patient's recurrent fall. If she remains bradycardic dose reduction may require. Currently continuing  warfarin.  4. Chronic kidney disease. Continue close monitoring.  5. Confusion. Recurrent CT scans are negative. Most likely related to medication.  She needs pain medication as well as benzodiazepine for symptom control. Continue close monitoring. Serial neuro checks.  Advance goals of care discussion: Full code   DVT Prophylaxis: on chronic therapeutic anticoagulation. Nutrition: Cardiac diet  Family Communication: family was present at bedside, opportunity was given to ask question and all questions were answered satisfactorily at the time of interview. Disposition: Admitted as observation, telemetry unit.  Author: Berle Mull, MD [facility] Pager: [phone] 01/17/2015  If 7PM-7AM, please contact night-coverage [email or web address] Password [email or web address]

## 2015-01-17 NOTE — ED Notes (Signed)
Brought in by [place] from [place] with c/o back pain after her fall tonight.  Fall was unwitnessed but daughter at the scene on [facility]'s arrival.  Pt verbally responsive but answers questions out of context to the questions by [facility].  Per pt's daughter, pt has been having falls lately with chronic back pain.  Has new Tramadol prescription.  Was seen at [facility] yesterday morning for "altered mental status"--- was subsequently discharged, her mental status attributed to Tramadol.  Pt's daughter reports that pt "is still mentally altered and confused".

## 2015-01-17 NOTE — Progress Notes (Signed)
ANTICOAGULATION CONSULT NOTE - Initial Consult  Pharmacy Consult for Warfarin Indication: atrial fibrillation  Allergies  Allergen Reactions  . Hctz [Hydrochlorothiazide]     unknown  . Sulfa Antibiotics     unknown  . Tetracyclines & Related     unknown    Patient Measurements: Height: _0  (167.6 cm) Weight: 180 lb 8.9 oz (81.9 kg) IBW/kg (Calculated) : 59.3 Heparin Dosing Weight:   Vital Signs: Temp: 98.5 F (36.9 C) (07/10 0606) Temp Source: Oral (07/10 0606) BP: 152/64 mmHg (07/10 0606) Pulse Rate: 56 (07/10 0606)  Labs:  Recent Labs  01/16/15 0942 01/16/15 1033 01/17/15 0345  HGB 13.0 13.9 12.7  HCT 38.5 41.0 37.8  PLT 218  --  241  APTT 52*  --   --   LABPROT 27.8*  --  27.3*  INR 2.64*  --  2.58*  CREATININE 1.72* 1.60* 1.58*  TROPONINI  --   --  <0.03    Estimated Creatinine Clearance: 29.1 mL/min (by C-G formula based on Cr of 1.58).   Medical History: Past Medical History  Diagnosis Date  . Hyperlipidemia   . Gout   . History of small bowel obstruction   . COPD (chronic obstructive pulmonary disease)   . Substance abuse     Tobacco  . Depression   . Pulmonary hypertension   . Umbilical hernia   . Bronchitis   . Atrial fibrillation   . High cholesterol   . Pneumonia   . Arthritis   . Abdominal aortic aneurysm     followed by Dr Kellie Simmering- 4.8 cm per [place] 6/12- [facility]  . Complication of anesthesia     slow to wake up per pt- anesthesia record on chart  . Hypertension   . Anxiety   . Atherosclerosis of aorta   . Systolic heart failure     EF 40-45%     Medications:  Scheduled:  . allopurinol  300 mg Oral Daily  . aspirin EC  81 mg Oral Daily  . citalopram  20 mg Oral Daily  . diltiazem  60 mg Oral 3 times per day  . lidocaine  2 patch Transdermal Q24H  . metoprolol tartrate  50 mg Oral BID  . pantoprazole  40 mg Oral Daily  . rosuvastatin  5 mg Oral q1800  . sodium chloride  3 mL Intravenous Q12H  . warfarin  3.75 mg Oral  ONCE-1800  . Warfarin - Pharmacist Dosing Inpatient   Does not apply q1800    Assessment: Patient on chronic warfarin for afib.  Per Med. rec and questioned Rx tech again, patient reported not taking warfarin for over 1 week.  INR at goal on admit.  These two facts don't seem to go together.    Goal of Therapy:  INR 2-3    Plan:  Warfarin 3.36m po x1 at 1800 Daily INR  GTyler Deis JShea StakesCrowford 01/17/2015,6:25 AM

## 2015-01-18 DIAGNOSIS — I10 Essential (primary) hypertension: Secondary | ICD-10-CM | POA: Diagnosis not present

## 2015-01-18 DIAGNOSIS — S32000A Wedge compression fracture of unspecified lumbar vertebra, initial encounter for closed fracture: Secondary | ICD-10-CM

## 2015-01-18 DIAGNOSIS — I482 Chronic atrial fibrillation: Secondary | ICD-10-CM

## 2015-01-18 DIAGNOSIS — N183 Chronic kidney disease, stage 3 (moderate): Secondary | ICD-10-CM | POA: Diagnosis not present

## 2015-01-18 LAB — PROTIME-INR
INR: 2.62 — ABNORMAL HIGH (ref 0.00–1.49)
Prothrombin Time: 27.7 seconds — ABNORMAL HIGH (ref 11.6–15.2)

## 2015-01-18 MED ORDER — WARFARIN SODIUM 2.5 MG PO TABS
2.5000 mg | ORAL_TABLET | Freq: Once | ORAL | Status: DC
  Filled 2015-01-18: qty 1

## 2015-01-18 MED ORDER — METOPROLOL TARTRATE 25 MG PO TABS: 25.0000 mg | ORAL_TABLET | Freq: Two times a day (BID) | ORAL | Status: AC

## 2015-01-18 MED ORDER — ENSURE ENLIVE PO LIQD: 237.0000 mL | Freq: Two times a day (BID) | ORAL | Status: AC

## 2015-01-18 MED ORDER — DILTIAZEM HCL 30 MG PO TABS: 30.0000 mg | ORAL_TABLET | Freq: Two times a day (BID) | ORAL | Status: AC

## 2015-01-18 NOTE — Discharge Instructions (Signed)

## 2015-01-18 NOTE — Progress Notes (Addendum)
[employer] authorization obtained & [facility] called for transport.      Raynaldo Opitz, [facility] Clinical Social Worker cell #: [phone]

## 2015-01-18 NOTE — Progress Notes (Addendum)
Called report to [facility] and spoke with Wells Guiles. Patient will be transported via [place]. Pt refused to wear TLSO brace.

## 2015-01-18 NOTE — Care Management Note (Signed)
Case Management Note  Patient Details  Name: CZARINA GINGRAS MRN: [id number] Date of Birth: 10-Oct-1929  Subjective/Objective: 81 y/o f admitted w/Lumbar compression fx.From home.PT-SNF.                   Action/Plan:d/c plan SNF   Expected Discharge Date:                  Expected Discharge Plan:  Skilled Nursing Facility  In-House Referral:  Clinical Social Work  Discharge planning Services  CM Consult  Post Acute Care Choice:    Choice offered to:     DME Arranged:    DME Agency:     HH Arranged:    [place] Agency:     Status of Service:  In process, will continue to follow  Medicare Important Message Given:    Date Medicare IM Given:    Medicare IM give by:    Date Additional Medicare IM Given:    Additional Medicare Important Message give by:     If discussed at [place] of Stay Meetings, dates discussed:    Additional Comments:  Dessa Phi, RN 01/18/2015, 2:22 PM

## 2015-01-18 NOTE — Discharge Summary (Signed)
Physician Discharge Summary  Heather Stevenson SFS:[id number] DOB: 1930-01-27 DOA: 01/17/2015  PCP: Alesia Richards, MD  Admit date: 01/17/2015 Discharge date: 01/18/2015  Recommendations for Outpatient Follow-up:  1. We have decreased Cardizem down to 30 mg twice daily and metoprolol down to 25 mg twice daily because patient's heart rate was in 50s. 2. Continue to hold Lasix due to renal insufficiency. Her current creatinine is 1.58 3. Since patient will not take Lasix she will not need potassium supplementation. Potassium is 4.2 prior to discharge.  Discharge Diagnoses:  Principal Problem:   Lumbar compression fracture Active Problems:   Hypertension   COPD (chronic obstructive pulmonary disease)   Chronic systolic CHF (congestive heart failure)   Atrial fibrillation, chronic   GERD (gastroesophageal reflux disease)   Chronic kidney disease   At high risk for falls   Fall   General weakness    Discharge Condition: stable   Diet recommendation: as tolerated   History of present illness:  81 y.o. female with past medical history of atrial fibrillation on anticoagulation with Coumadin, dyslipidemia, chronic diastolic CHF, hypertension. She presented to [facility] long status post fall. She apparently had a few falls in last 2 days. On admission, patient was hemodynamically stable. CT head did not show acute intracranial findings. The lumbar spine x-ray showed L2 and L3 superior endplate fracture with mild compression from November 2015. Patient has no complaints of pain in the back area. X-ray of the hips did not reveal acute fractures. Patient was seen by physical therapy and recommendation was for skilled nursing facility placement.  Hospital Course:   Principal Problem:   Lumbar compression fracture - Status post fall. Patient has L2 and L3 superior endplate fracture with mild compression. - Please note that she has no complaints of back pain. - She has been seen by physical  therapy who recommended skilled nursing facility placement. Patient is agreeable to skilled nursing facility placement. - Appreciate social work assistance with discharge - Pain management with Tylenol.  Active Problems:   Essential hypertension - Patient can continue taking Cardizem and metoprolol but please note we have cut down the doses because of bradycardia.    COPD (chronic obstructive pulmonary disease) - Stable. Not in acute exacerbation    Chronic diastolic CHF (congestive heart failure) - Last 2-D echo in March 2016 shows preserved ejection fraction - CHF compensated - Please note that we have recommended holding Lasix until renal function improves    Atrial fibrillation, chronic - CHADs vasc score at least 4 - We will continue anticoagulation with Coumadin. She will go to skilled nursing facility. If she continues to present with falls and most likely will need to stop Coumadin. For now, because she is going to skilled nursing facility rather than home this may be a safer environment for her and it's okay to continue Coumadin for now. - Continue to monitor INR, range 2-3 - Rate controlled with Cardizem and metoprolol    Chronic kidney disease, stage III - Baseline creatinine about one year ago was about 1.3 and on this admission 1.7. Probably exacerbated by Lasix which was placed on hold. - Creatinine prior to discharge is 1.58. - Continue to hold Lasix until creatinine improves to baseline.    Gout - Stable. Continue allopurinol    Dyslipidemia - Continue statin therapy and fiber supplement        Signed:  Leisa Lenz, MD  [facility] 01/18/2015, 10:23 AM  Pager #: [phone]  Time spent in minutes:  more than 30 minutes  Procedures:  None   Consultations:  Physical therapy   Discharge Exam: Filed Vitals:   01/18/15 0507  BP: 152/78  Pulse: 45  Temp: 98.5 F (36.9 C)  Resp: 20   Filed Vitals:   01/17/15 0906 01/17/15 1413 01/17/15  2129 01/18/15 0507  BP: 167/79 126/80 149/89 152/78  Pulse: 57 52 55 45  Temp:  98.2 F (36.8 C) 97.8 F (36.6 C) 98.5 F (36.9 C)  TempSrc:  Oral Oral Oral  Resp:  _0 Height:      Weight:    82.1 kg (181 lb)  SpO2: 98% 100% 99% 96%    General: Pt is alert, follows commands appropriately, not in acute distress Cardiovascular: bradycardic, S1/S2 appreciated  Respiratory: Clear to auscultation bilaterally, no wheezing, no crackles, no rhonchi Abdominal: Soft, non tender, non distended, bowel sounds +, no guarding Extremities: no cyanosis, pulses palpable bilaterally DP and PT Neuro: Grossly nonfocal  Discharge Instructions  Discharge Instructions    Call MD for:  difficulty breathing, headache or visual disturbances    Complete by:  As directed      Call MD for:  persistant nausea and vomiting    Complete by:  As directed      Call MD for:  severe uncontrolled pain    Complete by:  As directed      Diet - low sodium heart healthy    Complete by:  As directed      Discharge instructions    Complete by:  As directed   1. We have decreased Cardizem down to 30 mg twice daily and metoprolol down to 25 mg twice daily because patient's heart rate was in 50s. 2. Continue to hold Lasix due to renal insufficiency. Her current creatinine is 1.58 3. Since patient will not take Lasix she will not need potassium supplementation. Potassium is 4.2 prior to discharge.     Increase activity slowly    Complete by:  As directed             Medication List    STOP taking these medications        diltiazem 180 MG 24 hr capsule  Commonly known as:  CARDIZEM CD  Replaced by:  diltiazem 30 MG tablet     furosemide 20 MG tablet  Commonly known as:  LASIX     metoprolol succinate 100 MG 24 hr tablet  Commonly known as:  TOPROL-XL  Replaced by:  metoprolol tartrate 25 MG tablet     potassium chloride 10 MEQ tablet  Commonly known as:  K-DUR     traMADol 50 MG tablet  Commonly  known as:  ULTRAM      TAKE these medications        acetaminophen 500 MG tablet  Commonly known as:  TYLENOL  Take 500 mg by mouth every 6 (six) hours as needed for moderate pain.     allopurinol 300 MG tablet  Commonly known as:  ZYLOPRIM  TAKE 1 TABLET ONCE DAILY. FOR GOUT     aspirin 81 MG EC tablet  Take 1 tablet (81 mg total) by mouth daily.     citalopram 20 MG tablet  Commonly known as:  CELEXA  Take 1 tablet (20 mg total) by mouth daily.     diltiazem 30 MG tablet  Commonly known as:  CARDIZEM  Take 1 tablet (30 mg total) by mouth 2 (two) times daily.  feeding supplement (ENSURE ENLIVE) Liqd  Take 237 mLs by mouth 2 (two) times daily between meals.     fenofibrate 145 MG tablet  Commonly known as:  TRICOR  Take 145 mg by mouth daily.     Magnesium 250 MG Tabs  Take 250 mg by mouth daily.     metoprolol tartrate 25 MG tablet  Commonly known as:  LOPRESSOR  Take 1 tablet (25 mg total) by mouth 2 (two) times daily.     montelukast 10 MG tablet  Commonly known as:  SINGULAIR  TAKE ONE TABLET AT BEDTIME.     pantoprazole 40 MG tablet  Commonly known as:  PROTONIX  Take 1 tablet (40 mg total) by mouth daily.     rosuvastatin 20 MG tablet  Commonly known as:  CRESTOR  Take 1 tablet (20 mg total) by mouth daily.     warfarin 2.5 MG tablet  Commonly known as:  COUMADIN  TAKE 1 TABLET DAILY EXCEPT WEDNESDAY           Follow-up Information    Follow up with MCKEOWN,WILLIAM DAVID, MD. Schedule an appointment as soon as possible for a visit in 1 week.   Specialty:  Internal Medicine   Why:  Follow up appt after recent hospitalization   Contact information:   [address] [place] [address] [phone]        The results of significant diagnostics from this hospitalization (including imaging, microbiology, ancillary and laboratory) are listed below for reference.    Significant Diagnostic Studies: Dg Chest 2  View  01/16/2015   CLINICAL DATA:  Patient with left facial droop status post nursing home visit. Confusion. Multiple falls.  EXAM: CHEST  2 VIEW  COMPARISON:  Chest radiograph 09/16/2014  FINDINGS: Patient is rotated to the left. Stable enlarged cardiac and mediastinal contours. Markedly tortuous aorta. No consolidative pulmonary opacities. Minimal heterogeneous opacities left lung base. No pleural effusion or pneumothorax. Suggestion of possible linear lucency distal left clavicle.  IMPRESSION: Possible linear lucency distal left clavicle. Consider correlation with dedicated shoulder radiographs if patient is focally tender in this location to exclude nondisplaced fracture.  Cardiomegaly.  Left basilar atelectasis.   Electronically Signed   By: Lovey Newcomer M.D.   On: 01/16/2015 10:19   Dg Lumbar Spine Complete  01/17/2015   CLINICAL DATA:  Unwitnessed fall with mid to low back pain. Initial encounter.  EXAM: LUMBAR SPINE - COMPLETE 4+ VIEW  COMPARISON:  05/15/2014  FINDINGS: L3 compression fracture with approximately 25 % height loss, mainly involving the superior endplate. There is also new concavity of the L2 superior endplate consistent with fracture. No discrete fracture line is seen. No evidence of retropulsion. There is a moderate compression deformity of the T12 vertebral body, also present in 2015 (and progressed from that time).  Narrowed and calcified L5-S1 disc space. There is lower lumbar facet arthropathy with grade 1 anterolisthesis at L4-5.  IMPRESSION: 1. L2 and L3 superior endplate fractures with mild compression. These are new from November 2015 and presumably acute. 2. Remote moderate T12 compression fracture.   Electronically Signed   By: Monte Fantasia M.D.   On: 01/17/2015 02:42   Dg Clavicle Left  01/16/2015   CLINICAL DATA:  Fall, abnormal chest radiograph  EXAM: LEFT CLAVICLE - 2+ VIEWS  COMPARISON:  None.  FINDINGS: No displaced clavicle fracture is seen.  Degenerative changes at  the acromioclavicular joint.  Cutaneous lead overlying the distal 3rd of the  clavicle.  IMPRESSION: No fracture or dislocation is seen.   Electronically Signed   By: Julian Hy M.D.   On: 01/16/2015 12:14   Ct Head Wo Contrast  01/17/2015   CLINICAL DATA:  Fall with confusion.  Initial encounter.  EXAM: CT HEAD WITHOUT CONTRAST  TECHNIQUE: Contiguous axial images were obtained from the base of the skull through the vertex without intravenous contrast.  COMPARISON:  Head CT from yesterday  FINDINGS: Skull and Sinuses:Negative for fracture or destructive process.  There is a 1 cm nodule right preauricular which is stable from 05/15/2014.  Orbits: No acute abnormality.  Brain: No evidence of acute infarction, hemorrhage, hydrocephalus, or mass lesion/mass effect. Generalized brain atrophy which is advanced. There is related ventriculomegaly. Moderate chronic small vessel disease with confluent ischemic gliosis throughout the bilateral cerebral white matter.  IMPRESSION: 1. No evidence of intracranial injury or fracture. 2. Brain atrophy and chronic small vessel disease. 3. 1 cm preauricular adenopathy or parotid nodule on the right, stable from November 2015.   Electronically Signed   By: Monte Fantasia M.D.   On: 01/17/2015 04:26   Ct Head Wo Contrast  01/16/2015   CLINICAL DATA:  Left facial droop, confusion, fall  EXAM: CT HEAD WITHOUT CONTRAST  TECHNIQUE: Contiguous axial images were obtained from the base of the skull through the vertex without intravenous contrast.  COMPARISON:  09/15/2014  FINDINGS: No evidence of parenchymal hemorrhage or extra-axial fluid collection. No mass lesion, mass effect, or midline shift.  No CT evidence of acute infarction.  Subcortical white matter and periventricular small vessel ischemic changes.  Global cortical and central atrophy. Secondary ventricular prominence.  The visualized paranasal sinuses are essentially clear. The mastoid air cells are unopacified.  No  evidence of calvarial fracture.  IMPRESSION: No evidence of acute intracranial abnormality.  Atrophy with small vessel ischemic changes.   Electronically Signed   By: Julian Hy M.D.   On: 01/16/2015 11:35   Dg Hip Unilat With Pelvis 2-3 Views Right  01/17/2015   CLINICAL DATA:  Unwitnessed fall at home with right hip pain. Initial encounter.  EXAM: DG HIP (WITH OR WITHOUT PELVIS) 2-3V RIGHT  COMPARISON:  None.  FINDINGS: No evidence of hip fracture or dislocation. No evidence of pelvic ring fracture or diastasis.  Osteopenia.  Mild degenerative spurring about both hips.  IMPRESSION: No acute osseous findings.   Electronically Signed   By: Monte Fantasia M.D.   On: 01/17/2015 03:44    Microbiology: No results found for this or any previous visit (from the past 240 hour(s)).   Labs: Basic Metabolic Panel:  Recent Labs Lab 01/16/15 0942 01/16/15 1033 01/17/15 0345  NA 135 135 137  K 4.1 4.9 4.2  CL 98* 74* 104  CO2 26  --  25  GLUCOSE 103* 92 100*  BUN 26* 42* 28*  CREATININE 1.72* 1.60* 1.58*  CALCIUM 8.9  --  8.6*   Liver Function Tests:  Recent Labs Lab 01/16/15 0942 01/17/15 0345  AST 20 22  ALT 11* 11*  ALKPHOS 68 64  BILITOT 0.6 0.7  PROT 6.4* 6.5  ALBUMIN 3.1* 3.3*   No results for input(s): LIPASE, AMYLASE in the last 168 hours. No results for input(s): AMMONIA in the last 168 hours. CBC:  Recent Labs Lab 01/16/15 0942 01/16/15 1033 01/17/15 0345  WBC 7.2  --  8.9  NEUTROABS 4.5  --  5.7  HGB 13.0 13.9 12.7  HCT 38.5 41.0 37.8  MCV  95.8  --  96.7  PLT 218  --  241   Cardiac Enzymes:  Recent Labs Lab 01/17/15 0345  TROPONINI <0.03   BNP: BNP (last 3 results) No results for input(s): BNP in the last 8760 hours.  ProBNP (last 3 results) No results for input(s): PROBNP in the last 8760 hours.  CBG: No results for input(s): GLUCAP in the last 168 hours.

## 2015-01-18 NOTE — Progress Notes (Signed)
ANTICOAGULATION CONSULT NOTE - Follow Up Consult  Pharmacy Consult for Warfarin Indication: Atrial Fibrillation  Allergies  Allergen Reactions  . Hctz [Hydrochlorothiazide]     unknown  . Sulfa Antibiotics     unknown  . Tetracyclines & Related     unknown    Patient Measurements: Height: _0  (167.6 cm) Weight: 181 lb (82.1 kg) IBW/kg (Calculated) : 59.3 Heparin Dosing Weight:   Vital Signs: Temp: 98.5 F (36.9 C) (07/11 0507) Temp Source: Oral (07/11 0507) BP: 152/78 mmHg (07/11 0507) Pulse Rate: 45 (07/11 0507)  Labs:  Recent Labs  01/16/15 0942 01/16/15 1033 01/17/15 0345 01/18/15 0454  HGB 13.0 13.9 12.7  --   HCT 38.5 41.0 37.8  --   PLT 218  --  241  --   APTT 52*  --   --   --   LABPROT 27.8*  --  27.3* 27.7*  INR 2.64*  --  2.58* 2.62*  CREATININE 1.72* 1.60* 1.58*  --   TROPONINI  --   --  <0.03  --     Estimated Creatinine Clearance: 29.1 mL/min (by C-G formula based on Cr of 1.58).   Medications:  PTA warfarin 3.59m daily except 2.561mon M/W/F, LD 7/10.    Assessment: [age]oF admitted after third fall in 2 days, now with lumbar compression fracture.  Pt on chronic warfarin for hx atrial fibrillation.  Poor historian and conflicting reports on last dose warfarin at home.  INR tx on admission.  CT scan negative on 7/9 and 7/10 both negative. Warfarin resumed inpatient on 7/9.   7/11: INR today remains therapeutic. No issues noted with CBC 7/10.  Cardiac diet started, ate 100% of breakfast this morning.  Goal of Therapy:  INR 2-3 Monitor platelets by anticoagulation protocol: Yes   Plan:  Warfarin 2.70m72moday per home regimen. Daily PT/INR.   F/u long-term anticoag plans with repeated falls.    AmaHershal CoriaharmD, BCPS Pager: [phone]/2016 10:04 AM

## 2015-01-18 NOTE — Clinical Social Work Placement (Signed)
Patient is set to discharge to [facility] today. Patient & daughter, [name] aware. Discharge packet given to RN, Catie. Awaiting [employer] authorization. [facility] will be called for transport once insurance authorization obtained.     Raynaldo Opitz, [facility] Clinical Social Worker cell #: [phone]    CLINICAL SOCIAL WORK PLACEMENT  NOTE  Date:  01/18/2015  Patient Details  Name: Heather Stevenson MRN: [id number] Date of Birth: Dec 01, 1929  Clinical Social Work is seeking post-discharge placement for this patient at the [place] level of care (*CSW will initial, date and re-position this form in  chart as items are completed):  Yes   Patient/family provided with [facility] Work Department's list of facilities offering this level of care within the geographic area requested by the patient (or if unable, by the patient's family).  Yes   Patient/family informed of their freedom to choose among providers that offer the needed level of care, that participate in Medicare, Medicaid or managed care program needed by the patient, have an available bed and are willing to accept the patient.  Yes   Patient/family informed of [facility]'s ownership interest in [facility] and [facility], as well as of the fact that they are under no obligation to receive care at these facilities.  PASRR submitted to [employer] on 01/18/15     PASRR number received on 01/18/15     Existing PASRR number confirmed on 01/18/15     FL2 transmitted to all facilities in geographic area requested by pt/family on 01/18/15     FL2 transmitted to all facilities within larger geographic area on       Patient informed that his/her managed care company has contracts with or will negotiate with certain facilities, including the following:        Yes   Patient/family informed of bed offers received.  Patient chooses bed at [facility]      Physician recommends and patient chooses bed at      Patient to be transferred to [facility] on 01/18/15.  Patient to be transferred to facility by [facility]     Patient family notified on 01/18/15 of transfer.  Name of family member notified:  patient's daughter, [name] via phone     PHYSICIAN       Additional Comment:    _______________________________________________ Standley Brooking, LCSW 01/18/2015, 12:35 PM

## 2015-01-23 ENCOUNTER — Other Ambulatory Visit: Payer: Self-pay | Admitting: Internal Medicine

## 2015-02-08 DEATH — deceased

## 2015-02-25 ENCOUNTER — Encounter: Payer: Self-pay | Admitting: Internal Medicine

## 2015-05-11 ENCOUNTER — Encounter: Payer: Self-pay | Admitting: Internal Medicine

## 2015-07-17 IMAGING — CR DG HIP (WITH OR WITHOUT PELVIS) 2-3V*R*
4 series · 4 of 4 positions shown · non-contrast
Comparison: None.

CLINICAL DATA: Unwitnessed fall at home with right hip pain.
Initial encounter.

EXAM:
DG HIP (WITH OR WITHOUT PELVIS) 2-3V RIGHT

[t pelvis ap]
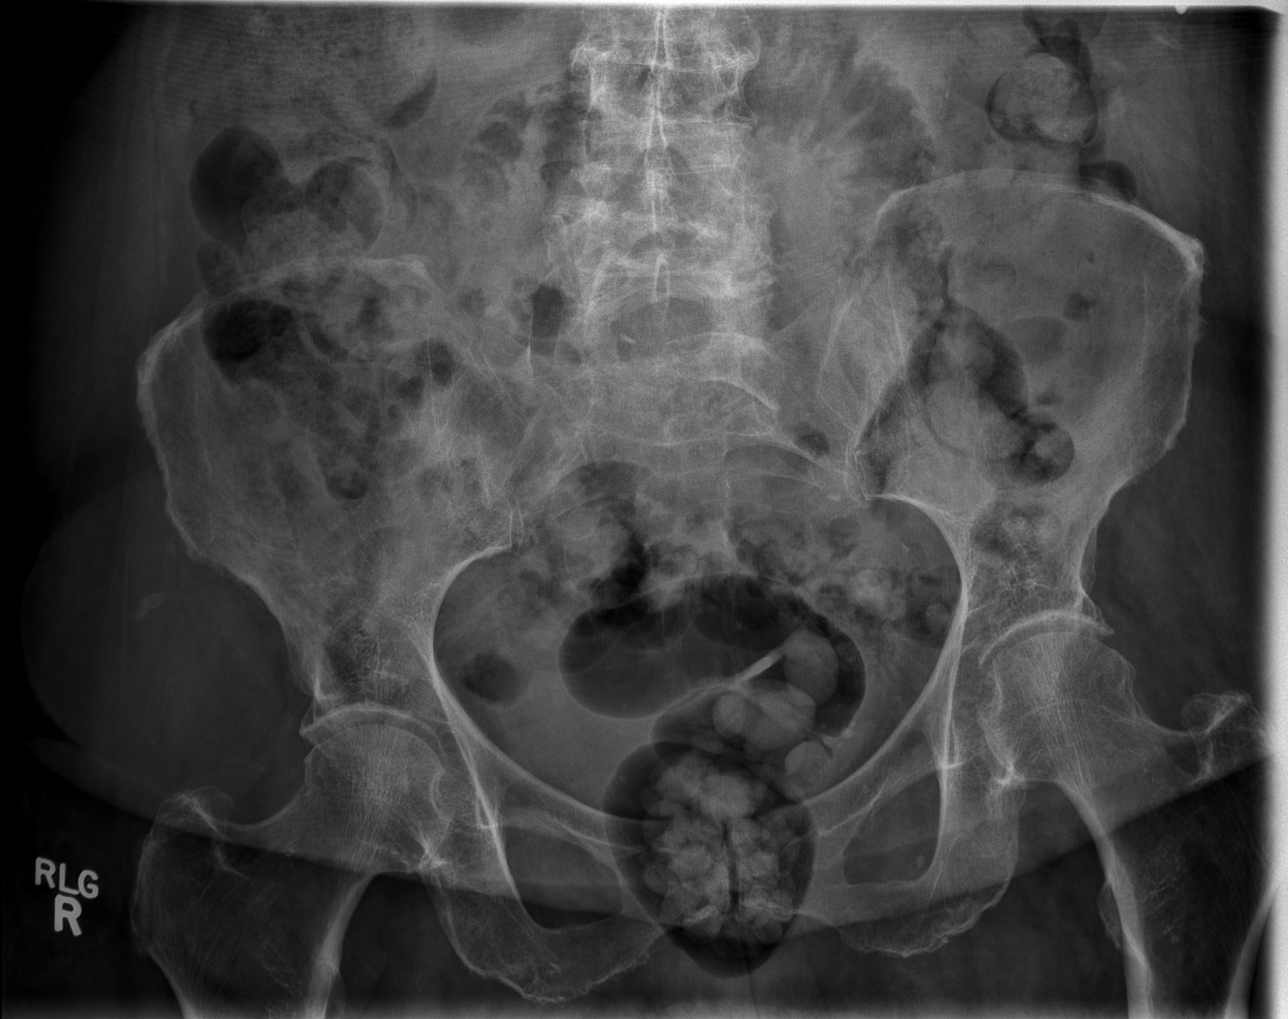

[t hip ap right (1 of 2)]
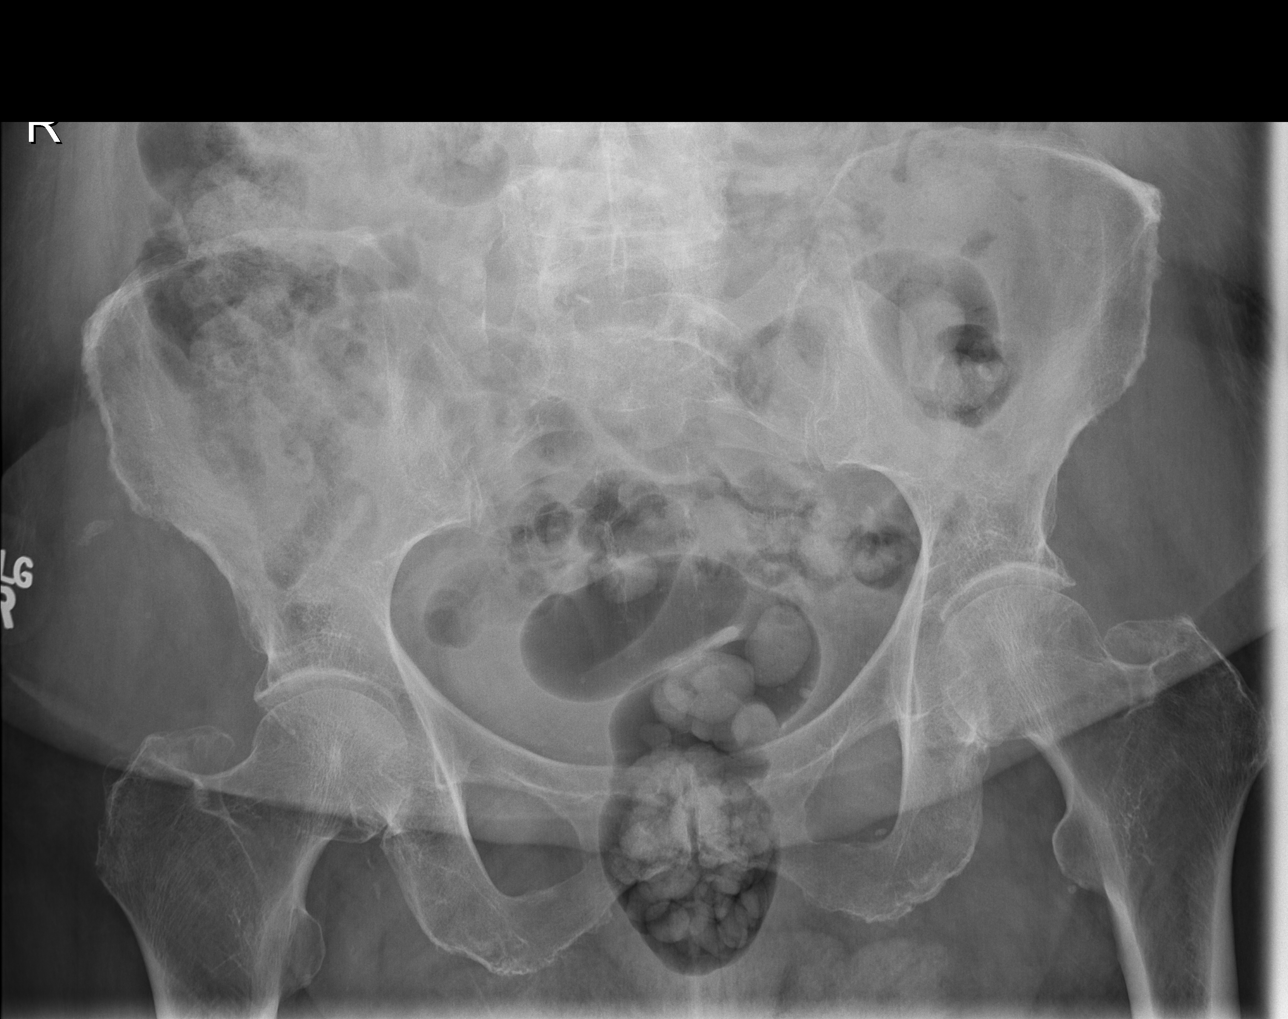

[t hip ap right (2 of 2)]
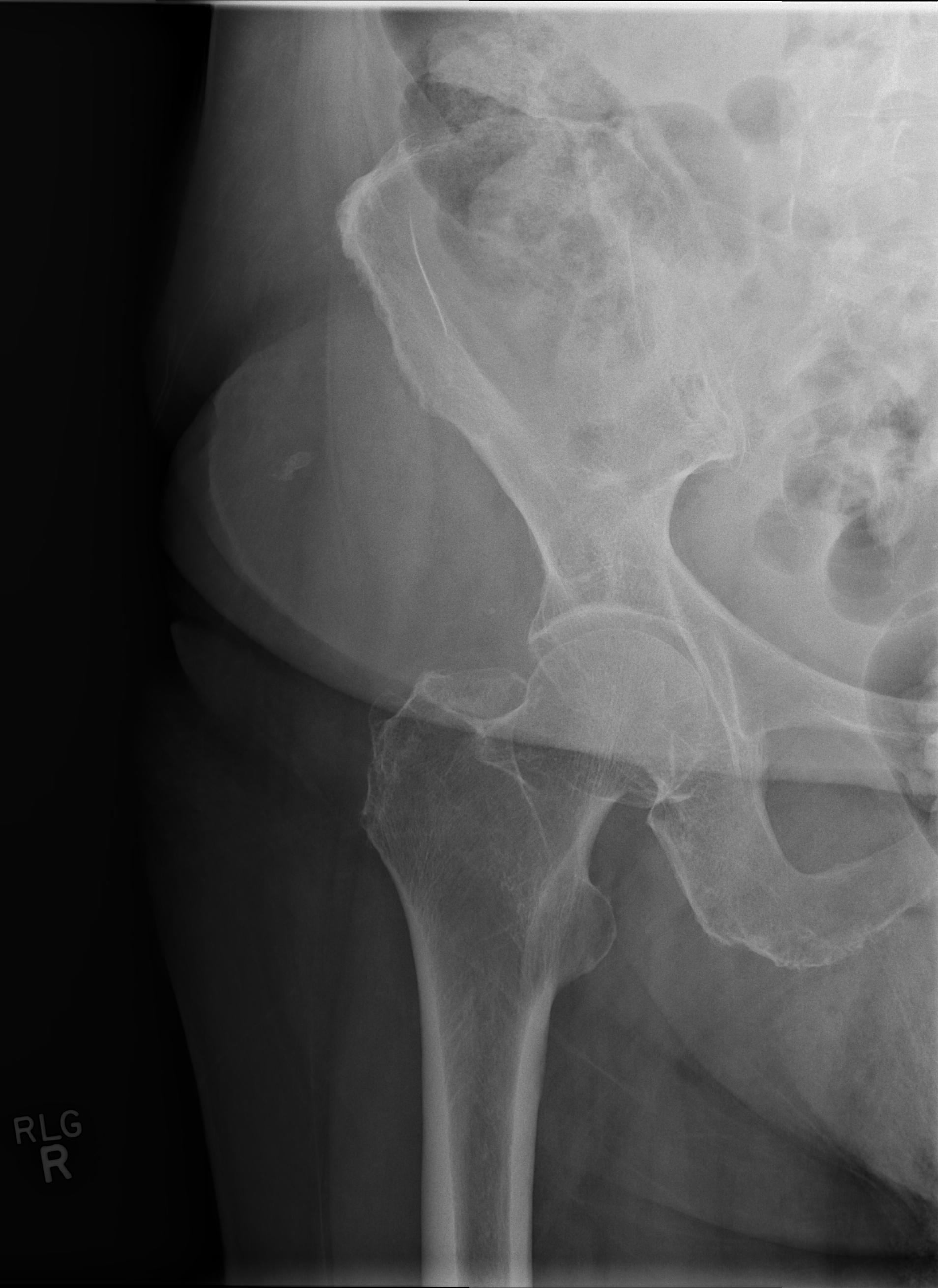

[t hip frog leg right]
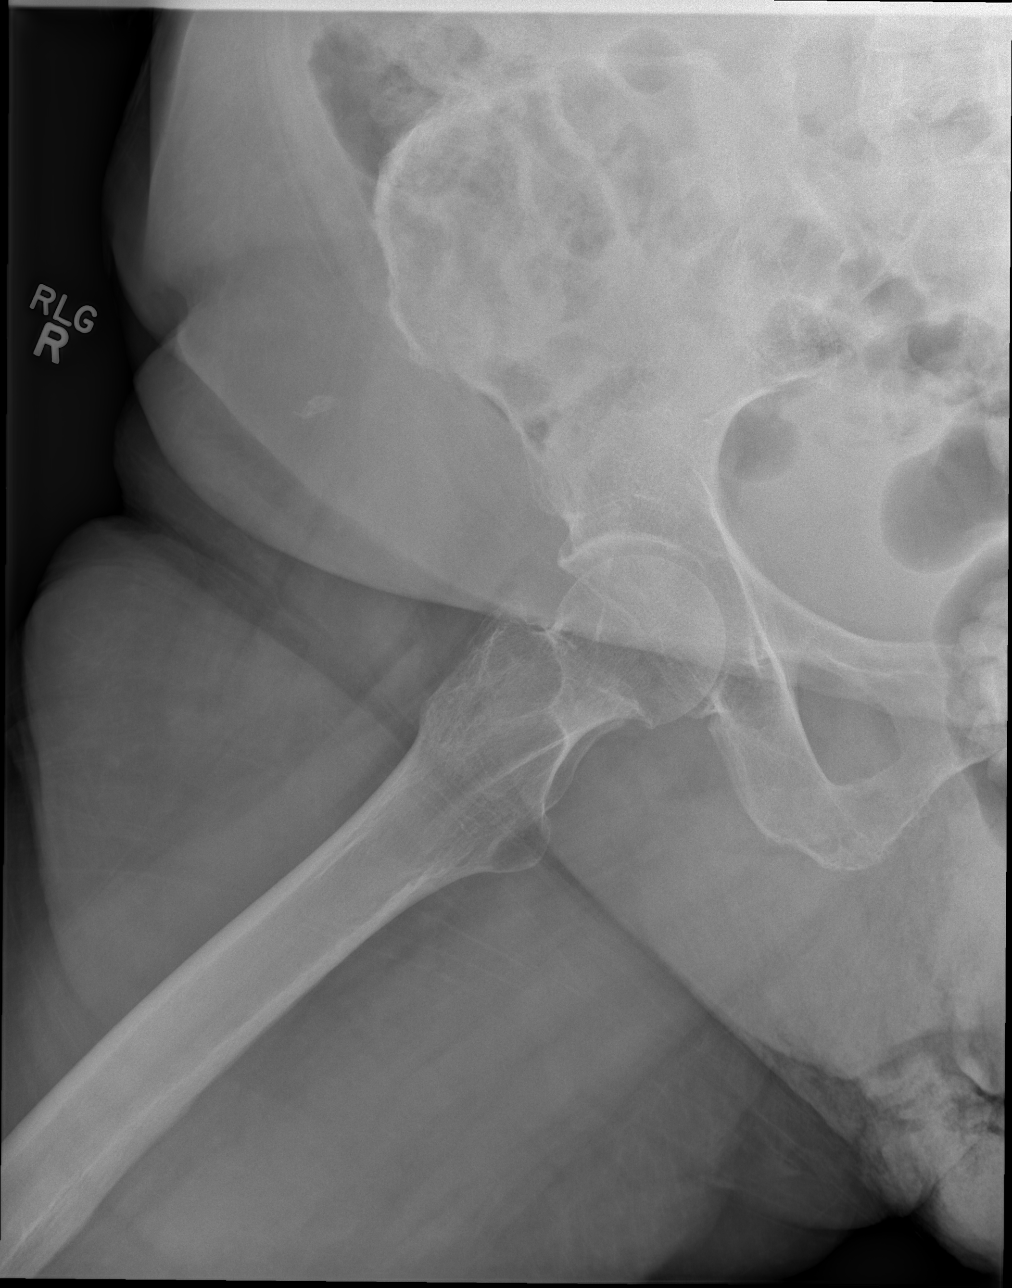

[4 of 4 positions shown; findings below may reference images not displayed]

FINDINGS: No evidence of hip fracture or dislocation. No evidence of pelvic
ring fracture or diastasis.

Osteopenia.

Mild degenerative spurring about both hips.
IMPRESSION: No acute osseous findings.

## 2015-07-17 IMAGING — CT CT HEAD W/O CM
2 series · 17 of 30 positions shown, 20 images · non-contrast
Comparison: Head CT from yesterday

CLINICAL DATA: Fall with confusion.  Initial encounter.

EXAM:
CT HEAD WITHOUT CONTRAST
TECHNIQUE: Contiguous axial images were obtained from the base of the skull
through the vertex without intravenous contrast.

[Series 2: head w/o · axial · non-contrast · 0.45mm/px · z∈[-150,-30]mm · 9 of 31 slices shown, 12 images]
[im 4/31  brain]
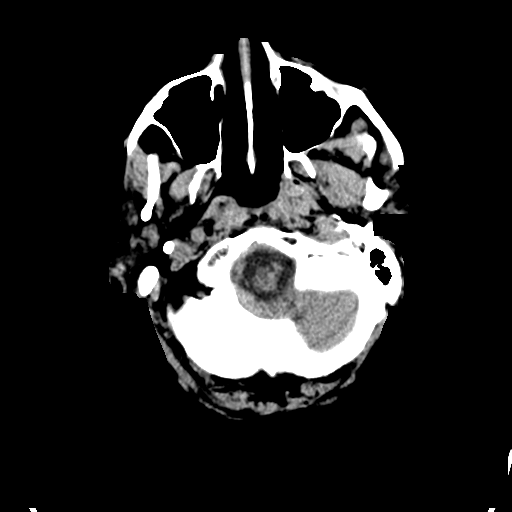
[im 4/31  bone]
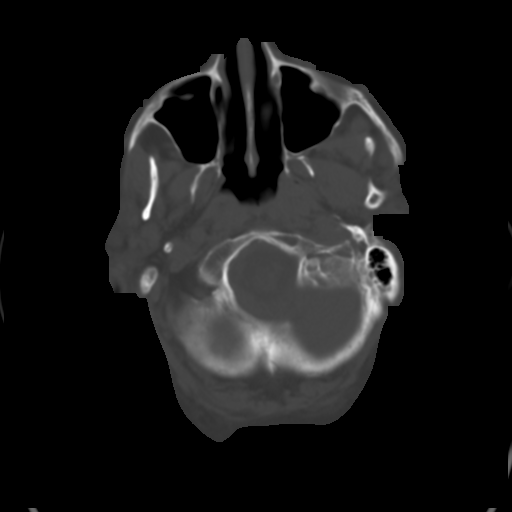
[im 7/31  brain]
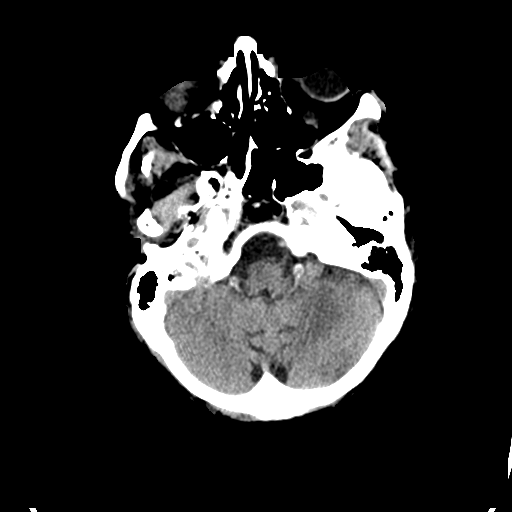
[im 10/31  brain]
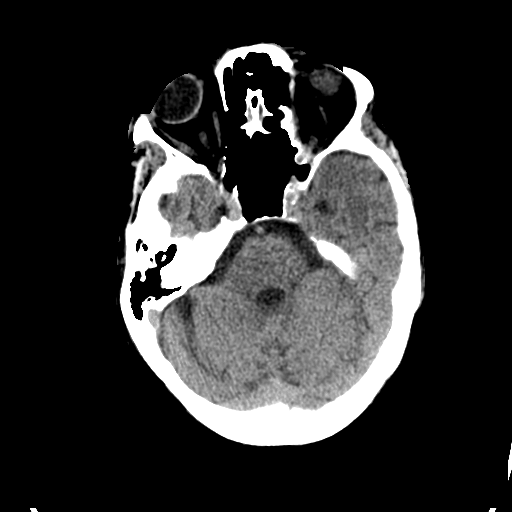
[im 13/31  brain]
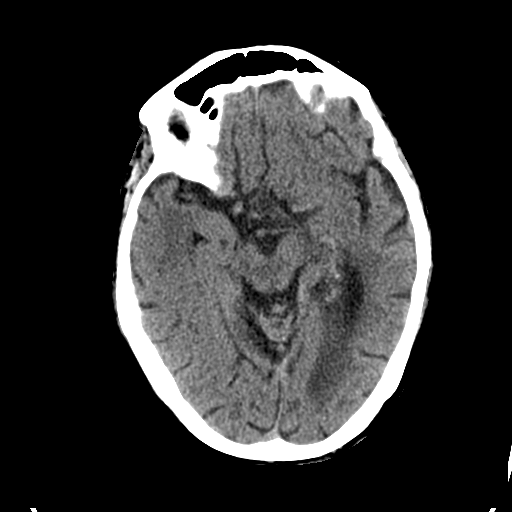
[im 16/31  brain]
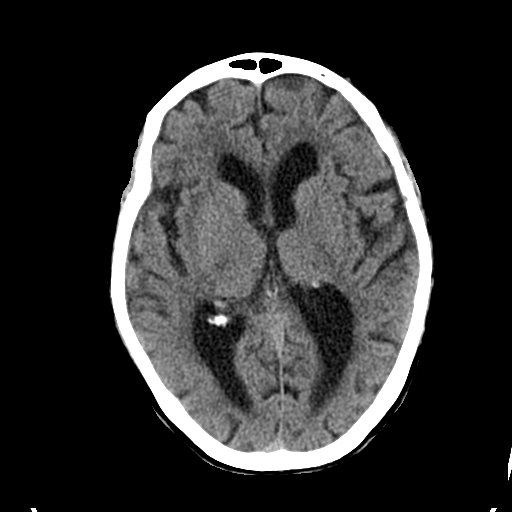
[im 16/31  bone]
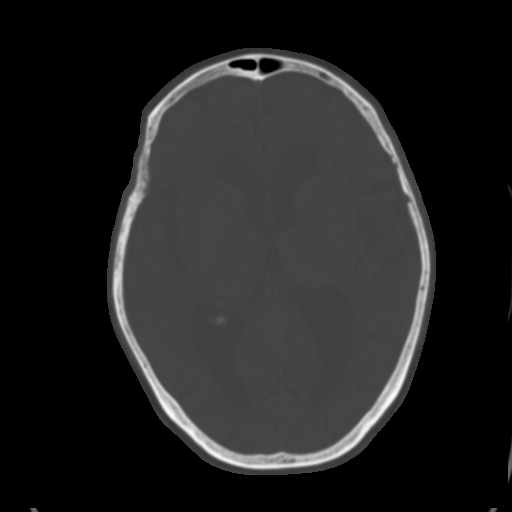
[im 19/31  brain]
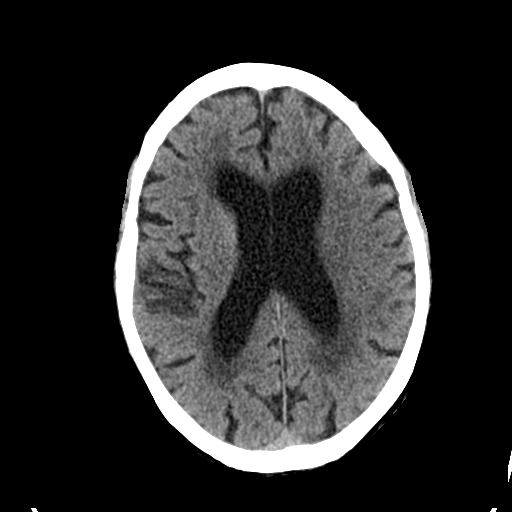
[im 22/31  brain]
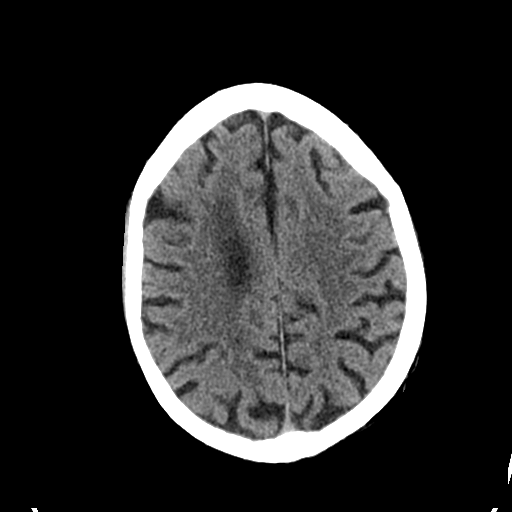
[im 25/31  brain]
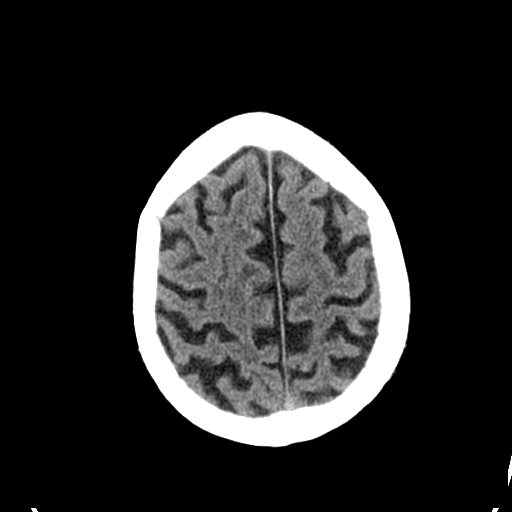
[im 28/31  brain]
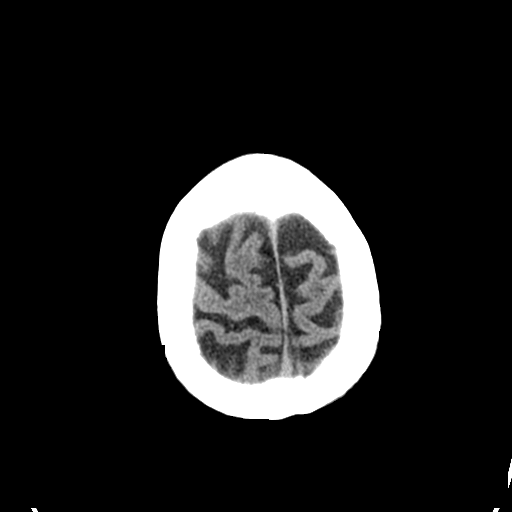
[im 28/31  bone]
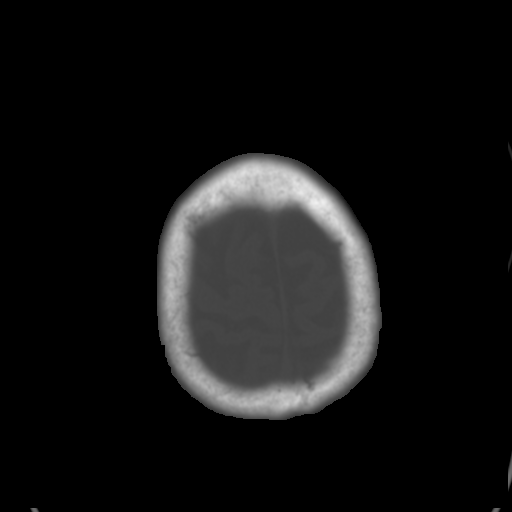

[Series 3: bone windows · axial · 0.45mm/px · z∈[-150,-33]mm · 8 of 51 slices shown]
[im 6/51  bone]
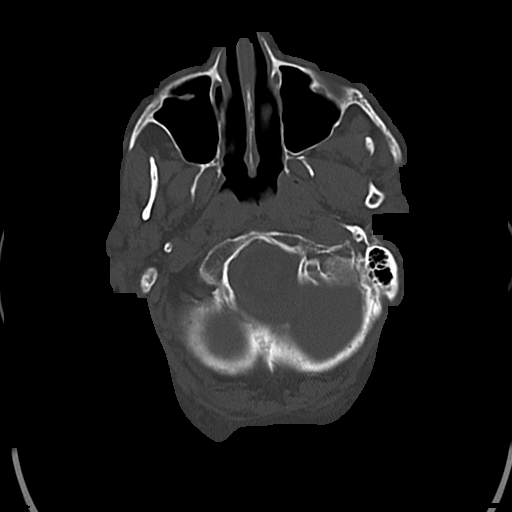
[im 12/51  bone]
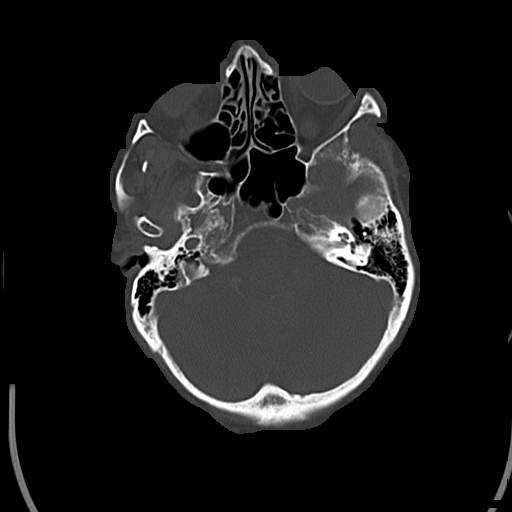
[im 17/51  bone]
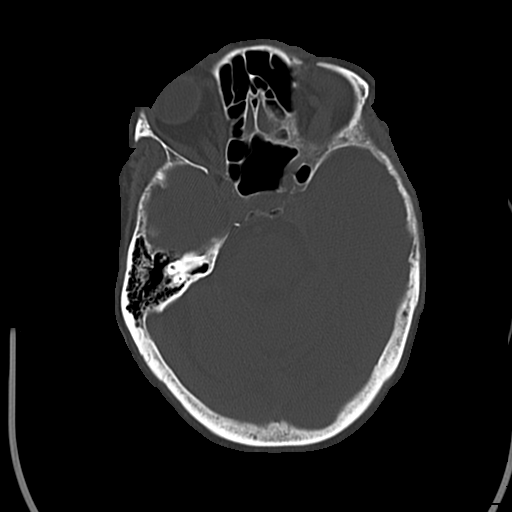
[im 23/51  bone]
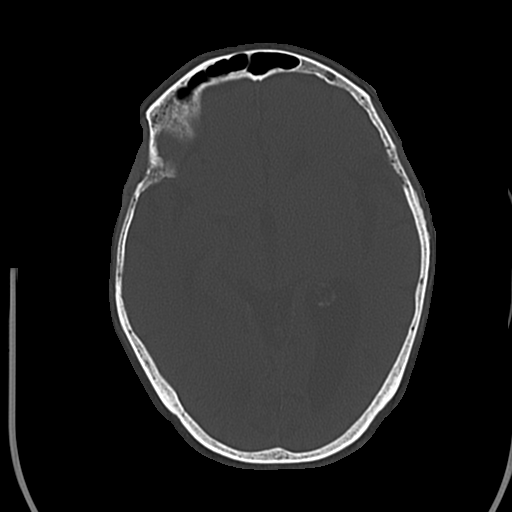
[im 28/51  bone]
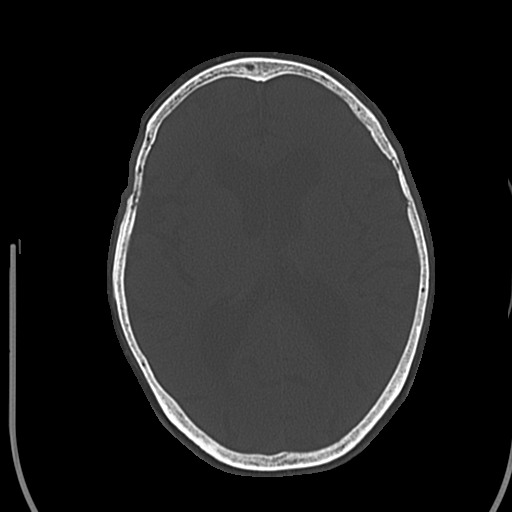
[im 34/51  bone]
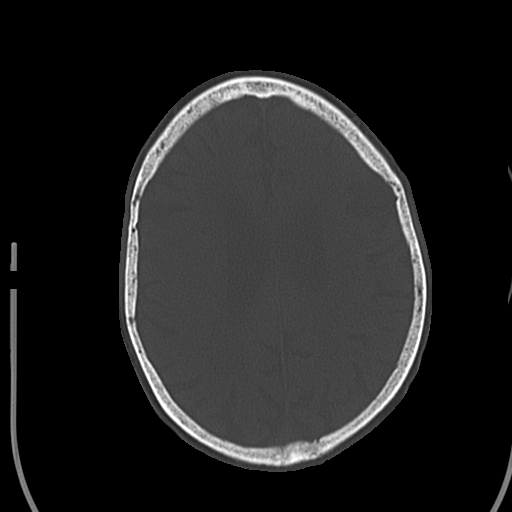
[im 39/51  bone]
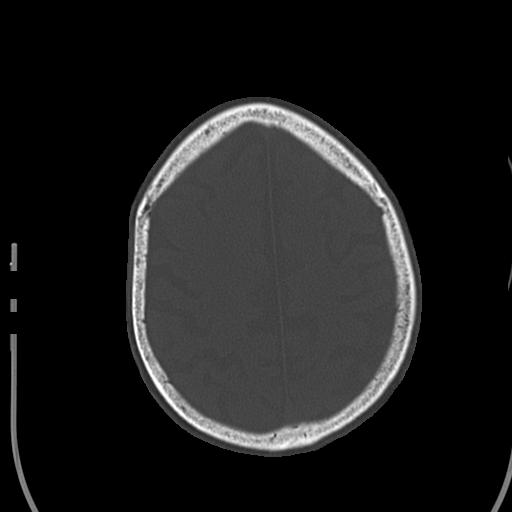
[im 45/51  bone]
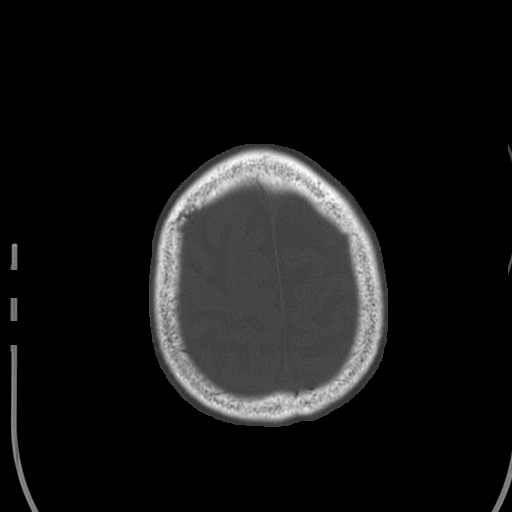

[17 of 30 positions shown; findings below may reference images not displayed]

FINDINGS: Skull and Sinuses:Negative for fracture or destructive process.

There is a 1 cm nodule right preauricular which is stable from
05/15/2014.

Orbits: No acute abnormality.

Brain: No evidence of acute infarction, hemorrhage, hydrocephalus,
or mass lesion/mass effect. Generalized brain atrophy which is
advanced. There is related ventriculomegaly. Moderate chronic small
vessel disease with confluent ischemic gliosis throughout the
bilateral cerebral white matter.
IMPRESSION: 1. No evidence of intracranial injury or fracture.
2. Brain atrophy and chronic small vessel disease.
3. 1 cm preauricular adenopathy or parotid nodule on the right,
stable from May 2014.
